# Patient Record
Sex: Female | Born: 1970 | Race: Black or African American | Hispanic: No | Marital: Single | Smoking: Former smoker
Health system: Southern US, Community
[De-identification: ages and names within clinical notes are randomized; demographics above are authoritative.]

## PROBLEM LIST (undated history)

## (undated) DIAGNOSIS — M2550 Pain in unspecified joint: Secondary | ICD-10-CM

## (undated) DIAGNOSIS — T753XXA Motion sickness, initial encounter: Secondary | ICD-10-CM

## (undated) DIAGNOSIS — M1990 Unspecified osteoarthritis, unspecified site: Secondary | ICD-10-CM

## (undated) DIAGNOSIS — E119 Type 2 diabetes mellitus without complications: Secondary | ICD-10-CM

## (undated) DIAGNOSIS — R7303 Prediabetes: Secondary | ICD-10-CM

## (undated) DIAGNOSIS — I10 Essential (primary) hypertension: Secondary | ICD-10-CM

## (undated) HISTORY — PX: APPENDECTOMY: SHX54

## (undated) HISTORY — PX: TONSILLECTOMY: SUR1361

## (undated) HISTORY — DX: Prediabetes: R73.03

---

## 2000-04-23 DIAGNOSIS — I10 Essential (primary) hypertension: Secondary | ICD-10-CM | POA: Insufficient documentation

## 2000-04-23 DIAGNOSIS — E1159 Type 2 diabetes mellitus with other circulatory complications: Secondary | ICD-10-CM | POA: Insufficient documentation

## 2000-04-23 HISTORY — DX: Essential (primary) hypertension: I10

## 2014-04-23 DIAGNOSIS — R7303 Prediabetes: Secondary | ICD-10-CM

## 2014-04-23 HISTORY — DX: Prediabetes: R73.03

## 2014-04-23 HISTORY — PX: ABDOMINAL HYSTERECTOMY: SHX81

## 2017-01-11 ENCOUNTER — Encounter (HOSPITAL_COMMUNITY): Payer: Self-pay | Admitting: Emergency Medicine

## 2017-01-11 DIAGNOSIS — I10 Essential (primary) hypertension: Secondary | ICD-10-CM | POA: Diagnosis not present

## 2017-01-11 DIAGNOSIS — Y92512 Supermarket, store or market as the place of occurrence of the external cause: Secondary | ICD-10-CM | POA: Diagnosis not present

## 2017-01-11 DIAGNOSIS — X500XXA Overexertion from strenuous movement or load, initial encounter: Secondary | ICD-10-CM | POA: Insufficient documentation

## 2017-01-11 DIAGNOSIS — S39012A Strain of muscle, fascia and tendon of lower back, initial encounter: Secondary | ICD-10-CM | POA: Insufficient documentation

## 2017-01-11 DIAGNOSIS — Y990 Civilian activity done for income or pay: Secondary | ICD-10-CM | POA: Diagnosis not present

## 2017-01-11 DIAGNOSIS — M545 Low back pain: Secondary | ICD-10-CM | POA: Diagnosis present

## 2017-01-11 DIAGNOSIS — Y9389 Activity, other specified: Secondary | ICD-10-CM | POA: Diagnosis not present

## 2017-01-11 NOTE — ED Triage Notes (Signed)
Complains of pain to L side of her neck that radiates down back and L leg into calf that started around 8:30pm after opening a heavy freezer door.  Pt ambulatory to triage without difficulty.

## 2017-01-12 ENCOUNTER — Emergency Department (HOSPITAL_COMMUNITY)
Admission: EM | Admit: 2017-01-12 | Discharge: 2017-01-12 | Disposition: A | Discharge status: Home / Self Care | Payer: Worker's Compensation | Attending: Emergency Medicine | Admitting: Emergency Medicine

## 2017-01-12 DIAGNOSIS — S39012A Strain of muscle, fascia and tendon of lower back, initial encounter: Secondary | ICD-10-CM

## 2017-01-12 HISTORY — DX: Essential (primary) hypertension: I10

## 2017-01-12 MED ORDER — IBUPROFEN 800 MG PO TABS
800.0000 mg | ORAL_TABLET | Freq: Once | ORAL | Status: AC
  Administered 2017-01-12: 800 mg via ORAL
  Filled 2017-01-12: qty 1

## 2017-01-12 MED ORDER — IBUPROFEN 800 MG PO TABS: 800.0000 mg | ORAL_TABLET | Freq: Three times a day (TID) | ORAL | 0 refills | Status: DC | PRN

## 2017-01-12 MED ORDER — HYDROCODONE-ACETAMINOPHEN 5-325 MG PO TABS: 1.0000 | ORAL_TABLET | Freq: Four times a day (QID) | ORAL | 0 refills | Status: DC | PRN

## 2017-01-12 MED ORDER — METHOCARBAMOL 500 MG PO TABS: 500.0000 mg | ORAL_TABLET | Freq: Three times a day (TID) | ORAL | 0 refills | Status: DC | PRN

## 2017-01-12 MED ORDER — ONDANSETRON 4 MG PO TBDP: 4.0000 mg | ORAL_TABLET | Freq: Three times a day (TID) | ORAL | 0 refills | Status: DC | PRN

## 2017-01-12 NOTE — ED Notes (Signed)
No answer in waiting area.

## 2017-01-12 NOTE — Discharge Instructions (Signed)
To find a primary care or specialty doctor please call [phone] or [phone] to access "[facility] Find a Doctor Service." ° °You may also go on the [facility] website at [email or web address] ° °There are also multiple [facility], [facility] and [facility] practices throughout the [place] that are frequently accepting new patients. You may find a clinic that is close to your home and contact them. ° °[facility] -  °[address] °[place] [address] °[phone] ° ° °[facility] -  °[address] °[place] [address] [phone] ° ° °[facility] - °[address]  °[place] [address] °[phone] ° ° °

## 2017-01-12 NOTE — ED Provider Notes (Signed)
TIME SEEN: 5:10 AM  CHIEF COMPLAINT: lumbar strain  HPI: Pt is a 46 y.o. female with history of hypertension who presents to the emergency department with diffuse lower back pain that at times radiates down the left leg. 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 She states earlier yesterday she tried to open a heavy door to the dairy cooler while at work and developed diffuse tight lower back pain that sometimes radiates down her left leg. She states she feels like she "threw her back out". No other injury. No numbness, tingling or focal weakness. No bowel or bladder incontinence. No urinary retention. No fever. Has not tried any medications prior to arrival. Pain worse with movement and better with staying still. No other injury.  ROS: See HPI Constitutional: no fever  Eyes: no drainage  ENT: no runny nose   Cardiovascular:  no chest pain  Resp: no SOB  GI: no vomiting GU: no dysuria Integumentary: no rash  Allergy: no hives  Musculoskeletal: no leg swelling  Neurological: no slurred speech ROS otherwise negative  PAST MEDICAL HISTORY/PAST SURGICAL HISTORY:  Past Medical History:  Diagnosis Date  . Hypertension     MEDICATIONS:  Prior to Admission medications   Not on File    ALLERGIES:  Allergies not on file  SOCIAL HISTORY:  Social History  Substance Use Topics  . Smoking status: Never Smoker  . Smokeless tobacco: Never Used  . Alcohol use No    FAMILY HISTORY: No family history on file.  EXAM: BP 109/77 (BP Location: Right Arm)   Pulse 70   Temp 98.2 F (36.8 C) (Oral)   Resp 17   Ht 6\' 2"  (1.88 m)   Wt 107.5 kg (237 lb)   SpO2 100%   BMI 30.43 kg/m  CONSTITUTIONAL: Alert and oriented and responds appropriately to questions. Well-appearing; well-nourished HEAD: Normocephalic EYES: Conjunctivae clear, pupils appear equal, EOMI ENT: normal nose; moist mucous membranes NECK: Supple, no meningismus, no nuchal rigidity, no LAD  CARD: RRR; S1  and S2 appreciated; no murmurs, no clicks, no rubs, no gallops RESP: Normal chest excursion without splinting or tachypnea; breath sounds clear and equal bilaterally; no wheezes, no rhonchi, no rales, no hypoxia or respiratory distress, speaking full sentences ABD/GI: Normal bowel sounds; non-distended; soft, non-tender, no rebound, no guarding, no peritoneal signs, no hepatosplenomegaly BACK:  The back appears normal and is tender over the bilateral paraspinal muscles without midline spinal tenderness or step-off or deformity, there is no CVA tenderness EXT: Normal ROM in all joints; non-tender to palpation; no edema; normal capillary refill; no cyanosis, no calf tenderness or swelling    SKIN: Normal color for age and race; warm; no rash NEURO: Moves all extremities equally, strength 5/5 in all 4 extremities, sensation to light touch intact diffusely, cranial nerves II through XII intact, normal speech, no saddle anesthesia, normal gait PSYCH: The patient's mood and manner are appropriate. Grooming and personal hygiene are appropriate.  MEDICAL DECISION MAKING: Pt here with lumbosacral strain after opening a heavy door at work earlier yesterday. No midline tenderness. Doubt fracture. No focal neurologic deficits. Doubt cauda equina, spinal stenosis, epidural dural abscess or hematoma, discitis, transverse myelitis. I do not feel she needs emergent imaging of her back. She drove herself to the emergency department and would like to drive home. Will give ibuprofen for pain. We'll discharge with prescriptions for Vicodin and Robaxin as well as ibuprofen for pain control. We will provide her with  a work note. Discussed return precautions. Patient comfortable with this plan. Given outpatient PCP for follow-up.  At this time, I do not feel there is any life-threatening condition present. I have reviewed and discussed all results (EKG, imaging, lab, urine as appropriate) and exam findings with patient/family.  I have reviewed nursing notes and appropriate previous records.  I feel the patient is safe to be discharged home without further emergent workup and can continue workup as an outpatient as needed. Discussed usual and customary return precautions. Patient/family verbalize understanding and are comfortable with this plan.  Outpatient follow-up has been provided if needed. All questions have been answered.      Mykeria Garman, Layla Maw, DO 01/12/17 [phone]

## 2017-12-05 ENCOUNTER — Emergency Department (HOSPITAL_COMMUNITY)
Admission: EM | Admit: 2017-12-05 | Discharge: 2017-12-06 | Disposition: A | Discharge status: Home / Self Care | Payer: Self-pay | Attending: Emergency Medicine | Admitting: Emergency Medicine

## 2017-12-05 ENCOUNTER — Encounter (HOSPITAL_COMMUNITY): Payer: Self-pay

## 2017-12-05 DIAGNOSIS — I10 Essential (primary) hypertension: Secondary | ICD-10-CM | POA: Insufficient documentation

## 2017-12-05 DIAGNOSIS — N764 Abscess of vulva: Secondary | ICD-10-CM | POA: Insufficient documentation

## 2017-12-05 DIAGNOSIS — Z79899 Other long term (current) drug therapy: Secondary | ICD-10-CM | POA: Insufficient documentation

## 2017-12-05 DIAGNOSIS — Z760 Encounter for issue of repeat prescription: Secondary | ICD-10-CM | POA: Insufficient documentation

## 2017-12-05 MED ORDER — LIDOCAINE-EPINEPHRINE (PF) 2 %-1:200000 IJ SOLN
20.0000 mL | Freq: Once | INTRAMUSCULAR | Status: AC
  Administered 2017-12-06: 20 mL
  Filled 2017-12-05: qty 20

## 2017-12-05 NOTE — ED Triage Notes (Signed)
Pt complains of high blood pressure and feeling dizzy, she's been out of her meds for a month Pt also complains of a draining vaginal blister that she says she got from a public toilet, she states that it itches and nothing OTC is working

## 2017-12-06 MED ORDER — LISINOPRIL-HYDROCHLOROTHIAZIDE 20-12.5 MG PO TABS: 1.0000 | ORAL_TABLET | Freq: Every day | ORAL | 3 refills | Status: DC

## 2017-12-06 NOTE — ED Provider Notes (Signed)
Kaitlin Holloway Provider Note   CSN: [id number] Arrival date & time: 12/05/17  1936     History   Chief Complaint Chief Complaint  Patient presents with  . Hypertension    HPI Kaitlin Holloway is a 47 y.o. female.  HPI Patient is a 47 year old female presents the emergency department complaints of right labial swelling for the past 2 weeks.  Some drainage 2 weeks ago.  None since then.  She reports this is painful.  No other complaints at this time  She is also requesting refill of her lisinopril hydrochlorothiazide.  She does not have a primary care physician.  No chest pain shortness of breath.   Past Medical History:  Diagnosis Date  . Hypertension     There are no active problems to display for this patient.   Past Surgical History:  Procedure Laterality Date  . ABDOMINAL HYSTERECTOMY    . APPENDECTOMY    . TONSILLECTOMY       OB History   None      Home Medications    Prior to Admission medications   Medication Sig Start Date End Date Taking? Authorizing Provider  lisinopril (PRINIVIL,ZESTRIL) 10 MG tablet Take 10 mg by mouth daily.   Yes [provider]  HYDROcodone-acetaminophen (NORCO/VICODIN) 5-325 MG tablet Take 1-2 tablets by mouth every 6 (six) hours as needed. Patient not taking: Reported on 12/06/2017 01/12/17   Ward, Layla Maw, DO  ibuprofen (ADVIL,MOTRIN) 800 MG tablet Take 1 tablet (800 mg total) by mouth every 8 (eight) hours as needed for mild pain. Patient not taking: Reported on 12/06/2017 01/12/17   Ward, Layla Maw, DO  lisinopril-hydrochlorothiazide (ZESTORETIC) 20-12.5 MG tablet Take 1 tablet by mouth daily. 12/06/17   Azalia Bilis, MD  methocarbamol (ROBAXIN) 500 MG tablet Take 1 tablet (500 mg total) by mouth every 8 (eight) hours as needed for muscle spasms. Patient not taking: Reported on 12/06/2017 01/12/17   Ward, Layla Maw, DO  ondansetron (ZOFRAN ODT) 4 MG disintegrating tablet Take 1 tablet (4 mg  total) by mouth every 8 (eight) hours as needed for nausea or vomiting. Patient not taking: Reported on 12/06/2017 01/12/17   Ward, Layla Maw, DO    Family History History reviewed. No pertinent family history.  Social History Social History   Tobacco Use  . Smoking status: Never Smoker  . Smokeless tobacco: Never Used  Substance Use Topics  . Alcohol use: No  . Drug use: No     Allergies   Morphine and related and Penicillins   Review of Systems Review of Systems  All other systems reviewed and are negative.    Physical Exam Updated Vital Signs BP 126/77   Pulse 71   Temp 98.3 F (36.8 C) (Oral)   Resp 16   SpO2 98%   Physical Exam  Constitutional: She is oriented to person, place, and time. She appears well-developed and well-nourished.  HENT:  Head: Normocephalic.  Eyes: EOM are normal.  Neck: Normal range of motion.  Cardiovascular: Normal rate and regular rhythm.  Pulmonary/Chest: Effort normal and breath sounds normal.  Abdominal: Soft. She exhibits no distension.  Genitourinary:  Genitourinary Comments: Chaperone present.  Superior right labial folliculitis with induration.  No drainage.  No significant surrounding erythema.  Possible ingrown hair.  Musculoskeletal: Normal range of motion.  Neurological: She is alert and oriented to person, place, and time.  Psychiatric: She has a normal mood and affect.  Nursing note and vitals reviewed.  ED Treatments / Results  Labs (all labs ordered are listed, but only abnormal results are displayed) Labs Reviewed - No data to display  EKG None  Radiology No results found.  Procedures INCISION AND DRAINAGE Performed by: Azalia Bilis, MD Authorized by: Azalia Bilis, MD     INCISION AND DRAINAGE Performed by: Azalia Bilis Consent: Verbal consent obtained. Risks and benefits: risks, benefits and alternatives were discussed Time out performed prior to procedure Type: abscess Body area: Right  labia Anesthesia: local infiltration Incision was made with a scalpel. Local anesthetic: lidocaine 2 % with epinephrine Anesthetic total: 3 ml Complexity: complex Blunt dissection to break up loculations Drainage: purulent Drainage amount: Small Packing material: None Patient tolerance: Patient tolerated the procedure well with no immediate complications.     Medications Ordered in ED Medications  lidocaine-EPINEPHrine (XYLOCAINE W/EPI) 2 %-1:200000 (PF) injection 20 mL (20 mLs Infiltration Given by Other 12/06/17 0129)     Initial Impression / Assessment and Plan / ED Course  I have reviewed the triage vital signs and the nursing notes.  Pertinent labs & imaging results that were available during my care of the patient were reviewed by me and considered in my medical decision making (see chart for details).     Incision and drainage of right labial abscess and folliculitis.  No significant surrounding erythema.  Warm compresses.  Warm water soaks.  Instructions to return to the ER for new or worsening symptoms.  Referral to the [facility].  Prescription for her low pressure medicine given.  Final Clinical Impressions(s) / ED Diagnoses   Final diagnoses:  Labial abscess  Medication refill    ED Discharge Orders         Ordered    lisinopril-hydrochlorothiazide (ZESTORETIC) 20-12.5 MG tablet  Daily     12/06/17 0214           Azalia Bilis, MD 12/06/17 [phone]

## 2017-12-06 NOTE — ED Notes (Addendum)
I&D materials at bedside. Lidocaine at bedside

## 2018-07-11 ENCOUNTER — Ambulatory Visit: Payer: Self-pay | Admitting: Internal Medicine

## 2018-07-11 ENCOUNTER — Other Ambulatory Visit: Payer: Self-pay

## 2018-07-11 ENCOUNTER — Encounter: Payer: Self-pay | Admitting: Internal Medicine

## 2018-07-11 VITALS — BP 126/80 | HR 78 | Resp 12 | Ht 73.5 in | Wt 226.0 lb

## 2018-07-11 DIAGNOSIS — I10 Essential (primary) hypertension: Secondary | ICD-10-CM

## 2018-07-11 DIAGNOSIS — R7303 Prediabetes: Secondary | ICD-10-CM

## 2018-07-11 MED ORDER — LISINOPRIL 10 MG PO TABS: 10.0000 mg | ORAL_TABLET | Freq: Every day | ORAL | 3 refills | Status: DC

## 2018-07-11 NOTE — Progress Notes (Signed)
    Subjective:    Patient ID: Kaitlin Holloway, female   DOB: 1970-08-31, 48 y.o.   MRN: [id number]   HPI   Here to establish  1.  Hypertension:  Last filled her bp med in ED and was confused as what she should take:  Lisinopril/HCTZ or Lisinopril.  She actually should be taking the Lisinopril 10 mg daily.  Feels the combination medication is too strong.  Gets light headed if takes daily.  Only taking 1/2 tab every other day.  Current Meds  Medication Sig  . lisinopril (PRINIVIL,ZESTRIL) 10 MG tablet Take 10 mg by mouth daily.   Allergies  Allergen Reactions  . Morphine And Related Other (See Comments)    headaches  . Penicillins Other (See Comments)    Has patient had a PCN reaction causing immediate rash, facial/tongue/throat swelling, SOB or lightheadedness with hypotension: Yes Has patient had a PCN reaction causing severe rash involving mucus membranes or skin necrosis: No Has patient had a PCN reaction that required hospitalization: No Has patient had a PCN reaction occurring within the last 10 years: No If all of the above answers are "NO", then may proceed with Cephalosporin use.      Review of Systems    Objective:   BP 126/80 (BP Location: Left Arm, Patient Position: Sitting, Cuff Size: Normal)   Pulse 78   Resp 12   Ht 6' 1.5" (1.867 m)   Wt 226 lb (102.5 kg)   BMI 29.41 kg/m   Physical Exam NAD [place] HEENT:  PERRL, EOMI,  Neck:  Supple, No adenopathy, no thyromegaly Chest:  CTA CV:  RRR with normal S1 and S2, No S3, S4 or murmur.  Carotid, radial and DP pulses normal and equal LE:  No edema  Assessment & Plan   1.  Essential Hypertension:  Switch back to Lisinopril 10 mg daily.   Will see if can get her back in in 4-6 weeks.  With COVID 19, may need to extend followup out. Fasting labs at same time as follow up.  2.  Prediabetes:  As above.  Fasting labs in 4-6 weeks with visit:  CBC, CMP, A1C, FLP.  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 Was given our Administrator, Civil Service.

## 2018-09-08 ENCOUNTER — Ambulatory Visit: Payer: Medicaid Other | Admitting: Internal Medicine

## 2018-09-08 ENCOUNTER — Other Ambulatory Visit: Payer: Self-pay

## 2018-09-08 ENCOUNTER — Encounter: Payer: Self-pay | Admitting: Internal Medicine

## 2018-09-08 VITALS — BP 124/78 | HR 78 | Resp 12 | Ht 73.5 in | Wt 216.0 lb

## 2018-09-08 DIAGNOSIS — I10 Essential (primary) hypertension: Secondary | ICD-10-CM | POA: Diagnosis not present

## 2018-09-08 DIAGNOSIS — R7303 Prediabetes: Secondary | ICD-10-CM | POA: Diagnosis not present

## 2018-09-08 DIAGNOSIS — Z79899 Other long term (current) drug therapy: Secondary | ICD-10-CM | POA: Diagnosis not present

## 2018-09-08 DIAGNOSIS — E010 Iodine-deficiency related diffuse (endemic) goiter: Secondary | ICD-10-CM | POA: Diagnosis not present

## 2018-09-08 DIAGNOSIS — F439 Reaction to severe stress, unspecified: Secondary | ICD-10-CM

## 2018-09-08 LAB — POCT URINALYSIS DIPSTICK
Bilirubin, UA: NEGATIVE
Blood, UA: NEGATIVE
Glucose, UA: POSITIVE — AB
Leukocytes, UA: NEGATIVE
Nitrite, UA: NEGATIVE
Protein, UA: NEGATIVE
Spec Grav, UA: 1.015 (ref 1.010–1.025)
Urobilinogen, UA: 0.2 E.U./dL
pH, UA: 6 (ref 5.0–8.0)
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
## 2018-09-08 NOTE — Patient Instructions (Signed)
[facility]  Directions  Website Address: [address] [place], [place], [place] [address]  Phone: [phone]  [facility] [address] [place], [place]  [address] [phone]  Drink a glass of water before every meal Drink 6-8 glasses of water daily Eat three meals daily Eat a protein and healthy fat with every meal (eggs,fish, chicken, [place] and limit red meats) Eat 5 servings of vegetables daily, mix the colors Eat 2 servings of fruit daily with skin, if skin is edible Use smaller plates Put food/utensils down as you chew and swallow each bite Eat at a table with friends/family at least once daily, no TV Do not eat in front of the TV  Recent studies show that people who consume all of their calories in a 12 hour period lose weight more efficiently.  For example, if you eat your first meal at 7:00 a.m., your last meal of the day should be completed by 7:00 p.m.

## 2018-09-09 DIAGNOSIS — F439 Reaction to severe stress, unspecified: Secondary | ICD-10-CM | POA: Insufficient documentation

## 2018-09-09 DIAGNOSIS — E010 Iodine-deficiency related diffuse (endemic) goiter: Secondary | ICD-10-CM | POA: Insufficient documentation

## 2018-09-09 LAB — CBC WITH DIFFERENTIAL/PLATELET
Basophils Absolute: 0 10*3/uL (ref 0.0–0.2)
Basos: 1 %
EOS (ABSOLUTE): 0.5 10*3/uL — ABNORMAL HIGH (ref 0.0–0.4)
Eos: 7 %
Hematocrit: 46 % (ref 34.0–46.6)
Hemoglobin: 15.5 g/dL (ref 11.1–15.9)
Immature Grans (Abs): 0 10*3/uL (ref 0.0–0.1)
Immature Granulocytes: 0 %
Lymphocytes Absolute: 1.5 10*3/uL (ref 0.7–3.1)
Lymphs: 25 %
MCH: 29.9 pg (ref 26.6–33.0)
MCHC: 33.7 g/dL (ref 31.5–35.7)
MCV: 89 fL (ref 79–97)
Monocytes Absolute: 0.4 10*3/uL (ref 0.1–0.9)
Monocytes: 6 %
Neutrophils Absolute: 3.8 10*3/uL (ref 1.4–7.0)
Neutrophils: 61 %
Platelets: 190 10*3/uL (ref 150–450)
RBC: 5.18 x10E6/uL (ref 3.77–5.28)
RDW: 13.3 % (ref 11.7–15.4)
WBC: 6.2 10*3/uL (ref 3.4–10.8)

## 2018-09-09 LAB — COMPREHENSIVE METABOLIC PANEL
ALT: 15 IU/L (ref 0–32)
AST: 15 IU/L (ref 0–40)
Albumin/Globulin Ratio: 1.6 (ref 1.2–2.2)
Albumin: 4.2 g/dL (ref 3.8–4.8)
Alkaline Phosphatase: 110 IU/L (ref 39–117)
BUN/Creatinine Ratio: 19 (ref 9–23)
BUN: 18 mg/dL (ref 6–24)
Bilirubin Total: 0.9 mg/dL (ref 0.0–1.2)
CO2: 21 mmol/L (ref 20–29)
Calcium: 9.4 mg/dL (ref 8.7–10.2)
Chloride: 99 mmol/L (ref 96–106)
Creatinine, Ser: 0.95 mg/dL (ref 0.57–1.00)
GFR calc Af Amer: 82 mL/min/{1.73_m2} (ref >59)
GFR calc non Af Amer: 71 mL/min/{1.73_m2} (ref >59)
Globulin, Total: 2.7 g/dL (ref 1.5–4.5)
Glucose: 464 mg/dL — ABNORMAL HIGH (ref 65–99)
Potassium: 4.5 mmol/L (ref 3.5–5.2)
Sodium: 135 mmol/L (ref 134–144)
Total Protein: 6.9 g/dL (ref 6.0–8.5)

## 2018-09-09 LAB — LIPID PANEL W/O CHOL/HDL RATIO
Cholesterol, Total: 210 mg/dL — ABNORMAL HIGH (ref 100–199)
HDL: 54 mg/dL (ref >39)
LDL Calculated: 130 mg/dL — ABNORMAL HIGH (ref 0–99)
Triglycerides: 130 mg/dL (ref 0–149)
VLDL Cholesterol Cal: 26 mg/dL (ref 5–40)

## 2018-09-09 LAB — HGB A1C W/O EAG: Hgb A1c MFr Bld: 12.5 % — ABNORMAL HIGH (ref 4.8–5.6)

## 2018-09-10 MED ORDER — METFORMIN HCL 500 MG PO TABS: 500.0000 mg | ORAL_TABLET | Freq: Two times a day (BID) | ORAL | 11 refills | Status: DC

## 2018-09-10 MED ORDER — AGAMATRIX ULTRA-THIN LANCETS MISC: 11 refills | Status: DC

## 2018-09-10 MED ORDER — GLIPIZIDE 5 MG PO TABS: ORAL_TABLET | ORAL | 11 refills | Status: DC

## 2018-09-10 MED ORDER — GLUCOSE BLOOD VI STRP: ORAL_STRIP | 11 refills | Status: DC

## 2018-09-10 MED ORDER — AGAMATRIX PRESTO W/DEVICE KIT: PACK | 0 refills | Status: DC

## 2018-09-10 NOTE — Addendum Note (Signed)
Addended by: Marcene Duos on: 09/10/2018 09:35 AM   Modules accepted: Orders

## 2018-09-16 ENCOUNTER — Ambulatory Visit: Payer: Medicaid Other | Admitting: Internal Medicine

## 2018-09-16 ENCOUNTER — Other Ambulatory Visit: Payer: Self-pay

## 2018-09-16 DIAGNOSIS — E1165 Type 2 diabetes mellitus with hyperglycemia: Secondary | ICD-10-CM

## 2018-09-16 MED ORDER — METFORMIN HCL ER 500 MG PO TB24: 500.0000 mg | ORAL_TABLET | Freq: Every day | ORAL | 11 refills | Status: DC

## 2018-09-16 MED ORDER — GLIMEPIRIDE 4 MG PO TABS: 4.0000 mg | ORAL_TABLET | Freq: Every day | ORAL | 11 refills | Status: DC

## 2018-09-16 NOTE — Addendum Note (Signed)
Addended by: Marcene Duos on: 09/16/2018 12:41 PM   Modules accepted: Orders

## 2018-09-16 NOTE — Progress Notes (Signed)
See notes attached to previous visit--long discussion regarding DM today.

## 2018-10-28 ENCOUNTER — Ambulatory Visit: Payer: Self-pay | Admitting: Internal Medicine

## 2019-01-09 ENCOUNTER — Encounter: Payer: Medicaid Other | Admitting: Internal Medicine

## 2019-03-04 ENCOUNTER — Encounter: Payer: Self-pay | Admitting: Internal Medicine

## 2019-03-04 ENCOUNTER — Other Ambulatory Visit: Payer: Self-pay

## 2019-03-04 ENCOUNTER — Ambulatory Visit: Payer: Medicaid Other | Admitting: Internal Medicine

## 2019-03-04 VITALS — BP 132/80 | HR 76 | Resp 12 | Ht 73.5 in | Wt 212.0 lb

## 2019-03-04 DIAGNOSIS — N898 Other specified noninflammatory disorders of vagina: Secondary | ICD-10-CM

## 2019-03-04 DIAGNOSIS — E1165 Type 2 diabetes mellitus with hyperglycemia: Secondary | ICD-10-CM

## 2019-03-04 LAB — POCT WET PREP WITH KOH
Clue Cells Wet Prep HPF POC: NEGATIVE
RBC Wet Prep HPF POC: NEGATIVE
Trichomonas, UA: NEGATIVE

## 2019-03-04 MED ORDER — METFORMIN HCL ER 500 MG PO TB24: ORAL_TABLET | ORAL | 11 refills | Status: DC

## 2019-03-04 MED ORDER — GLIPIZIDE 5 MG PO TABS: ORAL_TABLET | ORAL | 11 refills | Status: DC

## 2019-03-04 MED ORDER — FLUCONAZOLE 150 MG PO TABS: ORAL_TABLET | ORAL | 0 refills | Status: DC

## 2019-03-04 NOTE — Progress Notes (Signed)
    Subjective:    Patient ID: Kaitlin Holloway, female   DOB: 06-01-1970, 48 y.o.   MRN: [id number]   HPI   Insistent her symptoms stem from her daughter's use of Raid for ants prior to her symptoms starting.  1.  Vaginal discharge and itching:  For maybe 2 months off and on has had discharge that is creamy white at times almost cottage cheese.  Also with a fish smell.  Had this evaluated at [facility] about 3 weeks ago and treated with Fluconazole--sounds like 2 days. Symptoms improved, but recurred about 1 week later.   No intercourse since 1 month before symptoms started.    2.  DM:  See visits from May.  She stopped both Amaryl and Metformin ER as she received an alert from [facility] that her Metformin should be stopped.  Her A1C was above 12% prior to starting meds.   She denies polydipsia or polyuria.  Current Meds  Medication Sig  . lisinopril (PRINIVIL,ZESTRIL) 10 MG tablet Take 1 tablet (10 mg total) by mouth daily.   Allergies  Allergen Reactions  . Morphine And Related Other (See Comments)    headaches  . Penicillins Other (See Comments)    Has patient had a PCN reaction causing immediate rash, facial/tongue/throat swelling, SOB or lightheadedness with hypotension: Yes Has patient had a PCN reaction causing severe rash involving mucus membranes or skin necrosis: No Has patient had a PCN reaction that required hospitalization: No Has patient had a PCN reaction occurring within the last 10 years: No If all of the above answers are "NO", then may proceed with Cephalosporin use.      Review of Systems    Objective:   BP 132/80 (BP Location: Left Arm, Patient Position: Sitting, Cuff Size: Normal)   Pulse 76   Resp 12   Ht 6' 1.5" (1.867 m)   Wt 212 lb (96.2 kg)   BMI 27.59 kg/m   Physical Exam NAD Lungs:  CTA CV:  RRR without murmur or rub.  Radial and DP pulses normal and equal Abd:  S, NT, No HSM or mass, + BS GU:  External genitalia with mild chronic appearing  inflammation of labia.  White somewhat curd like vaginal discharge with mild vaginal mucosal inflammation.  No cervix.  Wet prep + for yeast Assessment & Plan  1.  Yeast vaginitis:  Discussed at length she will likely not clear the infection until her blood sugars are controlled. Fluconazole 150 mg daily for 5 days.  2.  DM:  To restart Metformin ER 500 mg and Glipizide 5 mg twice daily with meals.  Continue to work on diet and physical activity.  3.  On verge of anger much of interview regarding her health.  Would like for her to work with Adelene Amas, LCSW-A, but will wait until her appt on Nov 18 to broach this with her.  Refused influenza

## 2019-03-11 ENCOUNTER — Ambulatory Visit: Payer: Medicaid Other | Admitting: Internal Medicine

## 2019-03-11 ENCOUNTER — Other Ambulatory Visit: Payer: Self-pay

## 2019-03-25 ENCOUNTER — Other Ambulatory Visit: Payer: Self-pay

## 2019-03-25 DIAGNOSIS — Z20822 Contact with and (suspected) exposure to covid-19: Secondary | ICD-10-CM

## 2019-03-27 ENCOUNTER — Telehealth: Payer: Self-pay

## 2019-03-27 NOTE — Telephone Encounter (Signed)
Caller advise that result are not in yet 

## 2019-03-28 ENCOUNTER — Emergency Department (HOSPITAL_COMMUNITY)
Admission: EM | Admit: 2019-03-28 | Discharge: 2019-03-28 | Disposition: A | Discharge status: Home / Self Care | Payer: PRIVATE HEALTH INSURANCE | Attending: Emergency Medicine | Admitting: Emergency Medicine

## 2019-03-28 ENCOUNTER — Encounter (HOSPITAL_COMMUNITY): Payer: Self-pay | Admitting: Emergency Medicine

## 2019-03-28 ENCOUNTER — Other Ambulatory Visit: Payer: Self-pay

## 2019-03-28 DIAGNOSIS — L0291 Cutaneous abscess, unspecified: Secondary | ICD-10-CM | POA: Diagnosis present

## 2019-03-28 DIAGNOSIS — Z87891 Personal history of nicotine dependence: Secondary | ICD-10-CM | POA: Diagnosis not present

## 2019-03-28 DIAGNOSIS — I10 Essential (primary) hypertension: Secondary | ICD-10-CM | POA: Insufficient documentation

## 2019-03-28 DIAGNOSIS — Z79899 Other long term (current) drug therapy: Secondary | ICD-10-CM | POA: Insufficient documentation

## 2019-03-28 DIAGNOSIS — Z7984 Long term (current) use of oral hypoglycemic drugs: Secondary | ICD-10-CM | POA: Insufficient documentation

## 2019-03-28 LAB — NOVEL CORONAVIRUS, NAA: SARS-CoV-2, NAA: NOT DETECTED

## 2019-03-28 MED ORDER — BUPIVACAINE HCL (PF) 0.5 % IJ SOLN
10.0000 mL | Freq: Once | INTRAMUSCULAR | Status: AC
  Administered 2019-03-28: 12:00:00 10 mL
  Filled 2019-03-28: qty 10

## 2019-03-28 MED ORDER — ACETAMINOPHEN 500 MG PO TABS
1000.0000 mg | ORAL_TABLET | Freq: Once | ORAL | Status: AC
  Administered 2019-03-28: 1000 mg via ORAL
  Filled 2019-03-28: qty 2

## 2019-03-28 MED ORDER — SULFAMETHOXAZOLE-TRIMETHOPRIM 800-160 MG PO TABS: 1.0000 | ORAL_TABLET | Freq: Two times a day (BID) | ORAL | 0 refills | Status: AC

## 2019-03-28 NOTE — Discharge Instructions (Addendum)
Please take all of your antibiotics until finished!   You may develop abdominal discomfort or diarrhea from the antibiotic.  You may help offset this with probiotics which you can buy or get in yogurt. Do not eat  or take the probiotics until 2 hours after your antibiotic.   Your antibiotic has been sent to [facility] as requested.  Please use warm soaks daily and monitor for worsening symptoms.

## 2019-03-28 NOTE — ED Triage Notes (Addendum)
Pt reports boil to inner R thigh/groin area x4 days, reports area is draining some blood tinged fluid. Recently got tested for covid 3 days ago for n/v (neg result in chart).
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
## 2019-07-15 ENCOUNTER — Other Ambulatory Visit: Payer: Self-pay | Admitting: Internal Medicine

## 2019-07-15 ENCOUNTER — Telehealth: Payer: Self-pay | Admitting: Internal Medicine

## 2019-07-15 NOTE — Telephone Encounter (Signed)
Refill request sent in via interface

## 2019-07-15 NOTE — Telephone Encounter (Signed)
Patient called requesting Rx on lisinopril (PRINIVIL,ZESTRIL) 10 MG tablet to be called in to [facility] at [facility].  Please advise.

## 2019-08-14 ENCOUNTER — Emergency Department: Payer: PRIVATE HEALTH INSURANCE

## 2019-08-14 ENCOUNTER — Other Ambulatory Visit: Payer: Self-pay

## 2019-08-14 ENCOUNTER — Emergency Department
Admission: EM | Admit: 2019-08-14 | Discharge: 2019-08-14 | Disposition: A | Discharge status: Home / Self Care | Payer: PRIVATE HEALTH INSURANCE | Attending: Student | Admitting: Student

## 2019-08-14 ENCOUNTER — Encounter: Payer: Self-pay | Admitting: Emergency Medicine

## 2019-08-14 DIAGNOSIS — R7303 Prediabetes: Secondary | ICD-10-CM | POA: Insufficient documentation

## 2019-08-14 DIAGNOSIS — Z7984 Long term (current) use of oral hypoglycemic drugs: Secondary | ICD-10-CM | POA: Diagnosis not present

## 2019-08-14 DIAGNOSIS — I10 Essential (primary) hypertension: Secondary | ICD-10-CM | POA: Insufficient documentation

## 2019-08-14 DIAGNOSIS — M25461 Effusion, right knee: Secondary | ICD-10-CM | POA: Insufficient documentation

## 2019-08-14 DIAGNOSIS — Z79899 Other long term (current) drug therapy: Secondary | ICD-10-CM | POA: Insufficient documentation

## 2019-08-14 DIAGNOSIS — M25561 Pain in right knee: Secondary | ICD-10-CM | POA: Diagnosis present

## 2019-08-14 DIAGNOSIS — B379 Candidiasis, unspecified: Secondary | ICD-10-CM | POA: Diagnosis not present

## 2019-08-14 DIAGNOSIS — Z87891 Personal history of nicotine dependence: Secondary | ICD-10-CM | POA: Diagnosis not present

## 2019-08-14 LAB — WET PREP, GENITAL
Clue Cells Wet Prep HPF POC: NONE SEEN
Sperm: NONE SEEN
Trich, Wet Prep: NONE SEEN

## 2019-08-14 LAB — GLUCOSE, CAPILLARY: Glucose-Capillary: 293 mg/dL — ABNORMAL HIGH (ref 70–99)

## 2019-08-14 IMAGING — DX DG KNEE COMPLETE 4+V*R*
4 series · 4 of 4 positions shown · non-contrast
Comparison: None.

CLINICAL DATA: Right knee pain and swelling.

EXAM:
RIGHT KNEE - COMPLETE 4+ VIEW

[knee obl (1 of 2)]
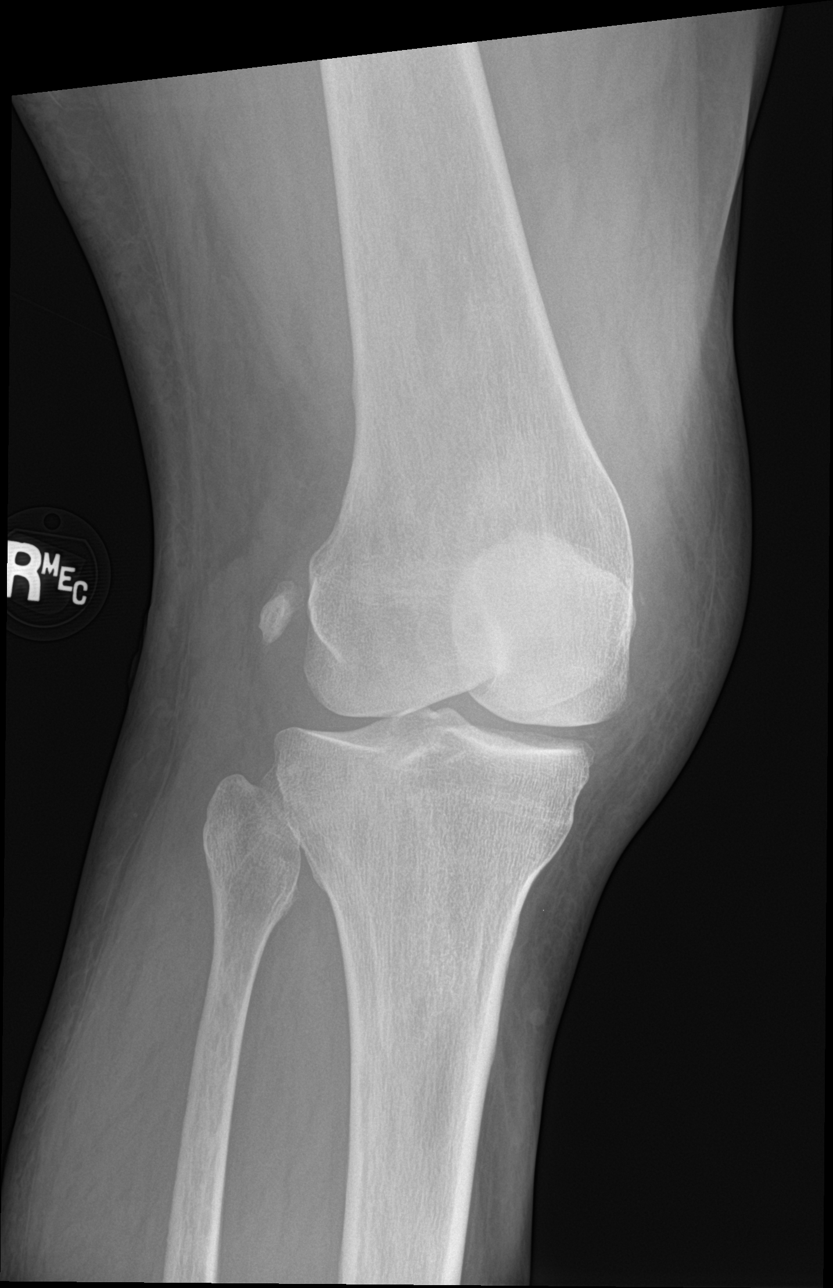

[knee lat]
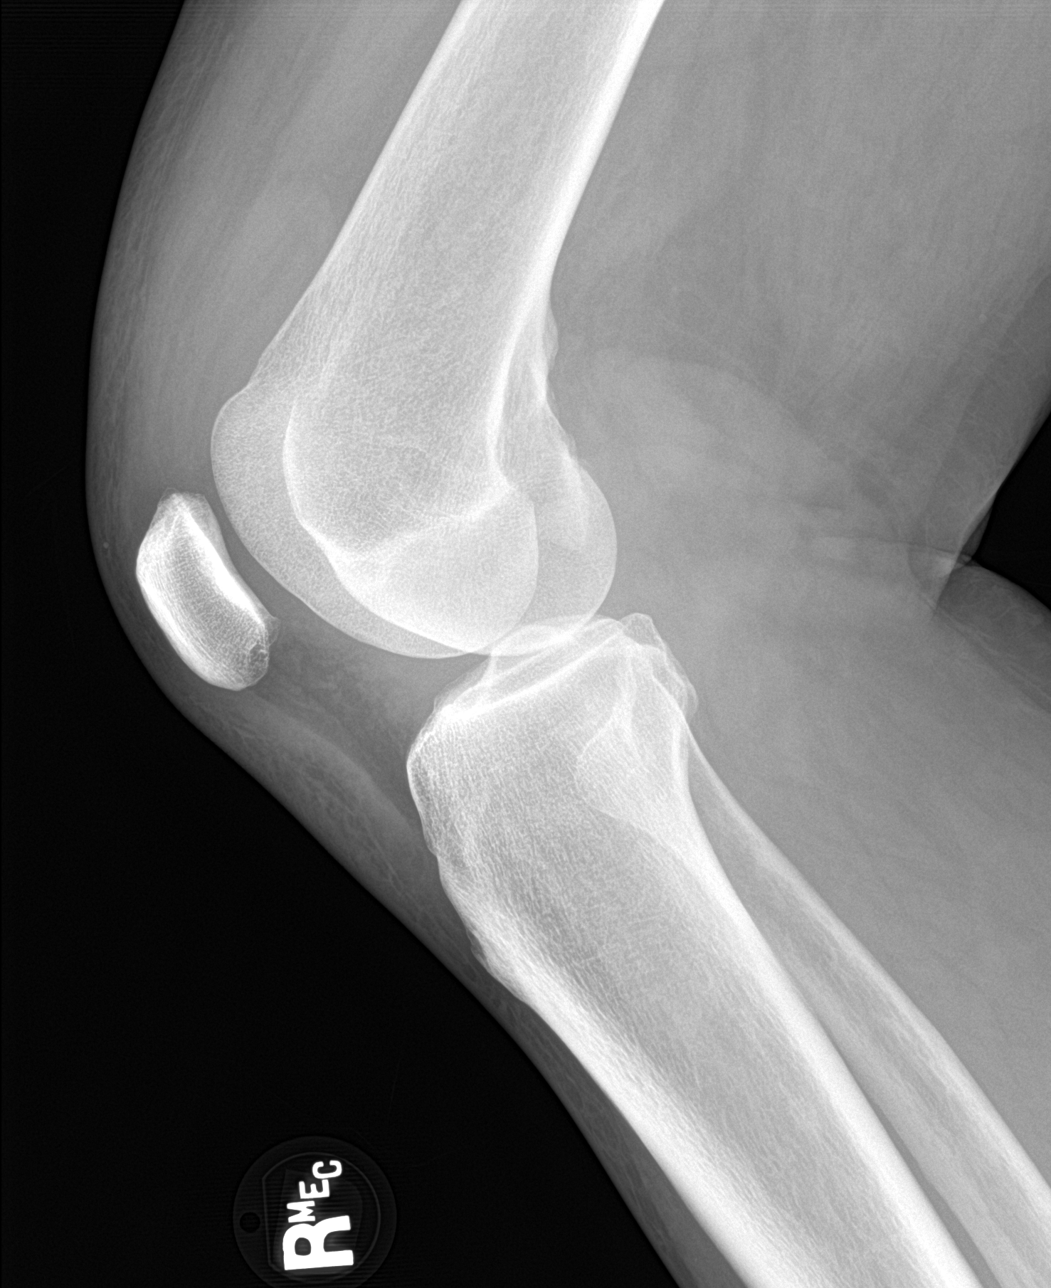

[knee obl (2 of 2)]
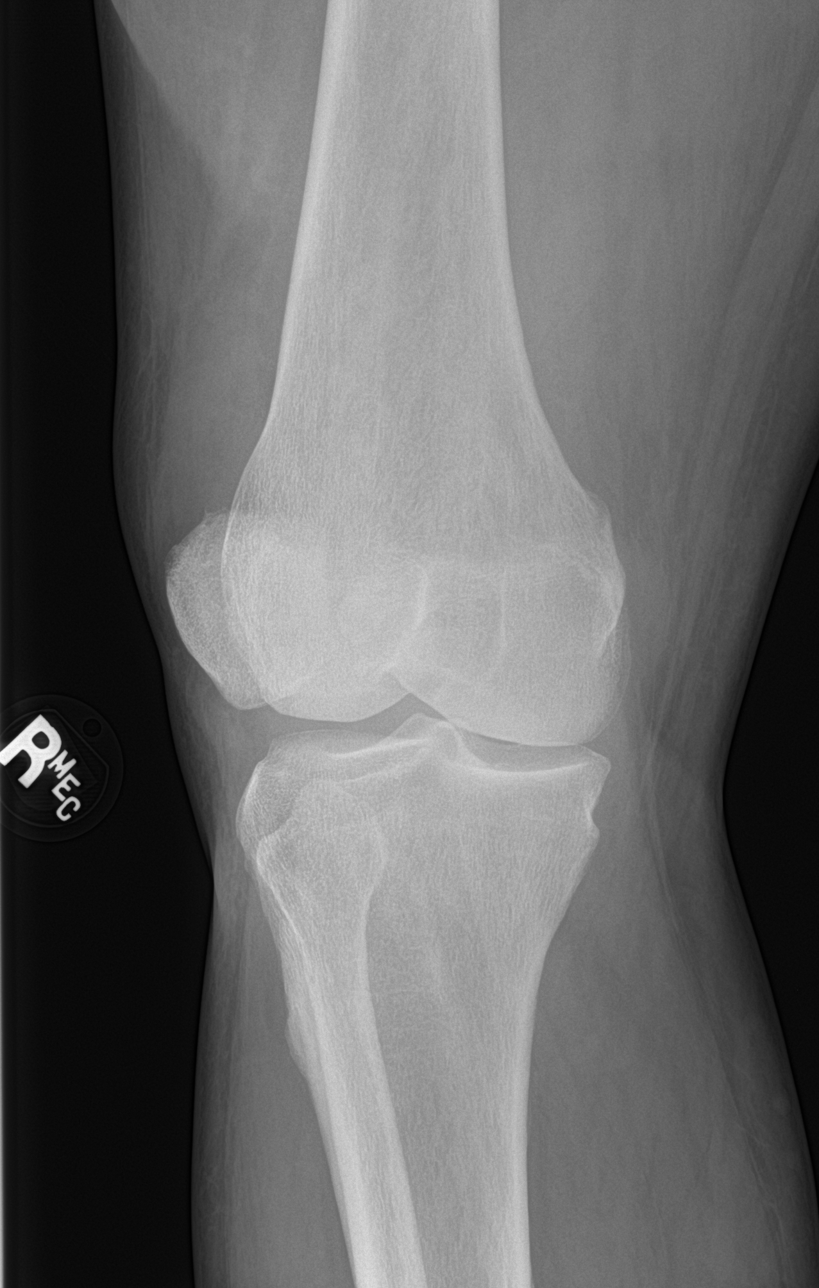

[knee ap]
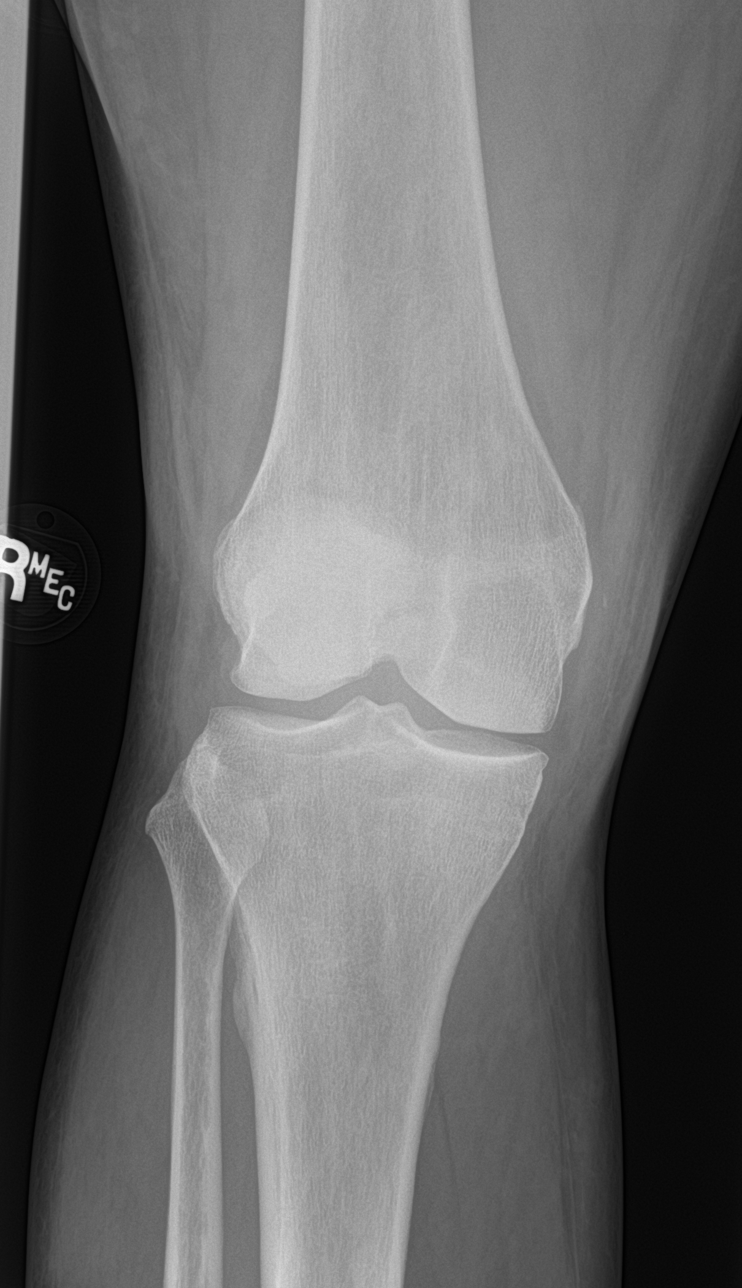

[4 of 4 positions shown; findings below may reference images not displayed]

FINDINGS: No fracture or dislocation. Minimal peripheral spurring of the
medial compartment. There is a joint effusion and generalized soft
tissue edema. No erosion, periosteal reaction or bony destruction.
No evidence of focal bone lesion.
IMPRESSION: 1. Soft tissue edema and joint effusion. No acute osseous
abnormality.
2. Minimal osteoarthritis of the medial compartment.

## 2019-08-14 MED ORDER — TRAMADOL HCL 50 MG PO TABS: 50.0000 mg | ORAL_TABLET | Freq: Four times a day (QID) | ORAL | 0 refills | Status: DC | PRN

## 2019-08-14 MED ORDER — FLUCONAZOLE 150 MG PO TABS
ORAL_TABLET | ORAL | 0 refills | Status: DC
Start: 2019-08-14 — End: 2020-01-25

## 2019-08-14 NOTE — ED Notes (Signed)
E-signature not working at this time. Pt verbalized understanding of D/C instructions, prescriptions and follow up care with no further questions at this time. Pt in NAD and ambulatory at time of D/C.  
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
## 2019-08-14 NOTE — ED Triage Notes (Signed)
FIRST NURSE NOTE- here for right leg pain. Pt also would like to be seen for "chemical induced yeast infection".  NAD. Placed in wheelchair for comfort.

## 2019-08-14 NOTE — ED Triage Notes (Signed)
Pt presents to ED via POV with c/o R leg/knee pain. Pt states initial injury happened in February, also c/o lower back pain due to the way she has been walking due to pain .Denies new injury.

## 2019-08-14 NOTE — ED Notes (Signed)
Pt states that she snagged her knee on something at work and that she's had worsening knee pain that also hurts in her knee and ankle on the right side. Pt also states she has a yeast infection she'd like to be treated for.

## 2019-08-14 NOTE — Discharge Instructions (Signed)
Wear the knee immobilizer anytime you are walking and bearing weight.  Apply ice to the knee.  Take over-the-counter Tylenol and ibuprofen.  Tramadol for pain not controlled by the Tylenol and ibuprofen.  Follow-up with your regular doctor as needed.  Follow-up with orthopedics if your knee is not improving in 5 to 7 days.

## 2019-08-27 ENCOUNTER — Other Ambulatory Visit: Payer: Self-pay

## 2019-08-27 MED ORDER — LISINOPRIL 10 MG PO TABS: 10.0000 mg | ORAL_TABLET | Freq: Every day | ORAL | 1 refills | Status: DC

## 2019-09-11 ENCOUNTER — Ambulatory Visit: Payer: Medicaid Other | Admitting: Internal Medicine

## 2019-09-30 ENCOUNTER — Other Ambulatory Visit: Payer: Self-pay | Admitting: Orthopedic Surgery

## 2019-09-30 DIAGNOSIS — M25461 Effusion, right knee: Secondary | ICD-10-CM

## 2019-10-15 ENCOUNTER — Telehealth: Payer: Self-pay

## 2019-10-15 NOTE — Telephone Encounter (Signed)
Patient called stating she would like to cancel her appointment for next Tuesday 10/20/19. I asked if there was a reason due to rescheduling this patient several times. Patient states she has lost faith in all doctors and everything they do is a experiment and she is sick of it.   Patient also states she no longer wants to be in this world and is a piece few not so nice words. I asked patient if she was actively suicidal patient denied any intent to harm herself and did not have any plan. Patient states "she is simply going to stop all of her medications and if she dies then she just dies". I asked patient if she would be willing to speak with someone about how she is feeling and patient stated no.   Informed patient I would cancel her appointment and let Dr. Delrae Alfred know and she may give her a call. Patient states if she feels like answering the phone she will and not the she won't.    Routing message to Dr. Delrae Alfred and Sherin Quarry for further assistance.

## 2019-10-15 NOTE — Telephone Encounter (Signed)
Called, but patient did not pick up. Left message for her to call back and will try again tomorrow.

## 2019-10-16 NOTE — Telephone Encounter (Signed)
LCSW contacted patient per nurse and provider to check in due to statements reported during phone call with nurse. LCSW left message with purpose of phone call if she wanted to talk further and check in for safety.      Patient spoke with LCSW shared gratitude for concern expressed but did express she did not want to speak further about it. Patient denied any concerns related to safety.

## 2019-10-20 ENCOUNTER — Ambulatory Visit: Payer: Self-pay | Admitting: Internal Medicine

## 2019-10-31 ENCOUNTER — Ambulatory Visit
Admission: RE | Admit: 2019-10-31 | Discharge: 2019-10-31 | Disposition: A | Discharge status: Home / Self Care | Payer: PRIVATE HEALTH INSURANCE | Source: Ambulatory Visit | Attending: Orthopedic Surgery | Admitting: Orthopedic Surgery

## 2019-10-31 ENCOUNTER — Other Ambulatory Visit: Payer: Self-pay

## 2019-10-31 DIAGNOSIS — M25561 Pain in right knee: Secondary | ICD-10-CM
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
## 2020-01-22 ENCOUNTER — Encounter: Payer: Self-pay | Admitting: Ophthalmology

## 2020-01-25 ENCOUNTER — Encounter: Payer: Self-pay | Admitting: Ophthalmology

## 2020-01-25 ENCOUNTER — Other Ambulatory Visit: Payer: Self-pay

## 2020-01-26 ENCOUNTER — Other Ambulatory Visit
Admission: RE | Admit: 2020-01-26 | Discharge: 2020-01-26 | Disposition: A | Discharge status: Home / Self Care | Source: Ambulatory Visit | Attending: Ophthalmology | Admitting: Ophthalmology

## 2020-01-26 DIAGNOSIS — Z01812 Encounter for preprocedural laboratory examination: Secondary | ICD-10-CM | POA: Diagnosis present

## 2020-01-26 DIAGNOSIS — Z20822 Contact with and (suspected) exposure to covid-19: Secondary | ICD-10-CM | POA: Diagnosis not present

## 2020-01-26 LAB — SARS CORONAVIRUS 2 (TAT 6-24 HRS): SARS Coronavirus 2: NEGATIVE

## 2020-01-26 NOTE — Discharge Instructions (Signed)

## 2020-01-28 ENCOUNTER — Ambulatory Visit
Admission: RE | Admit: 2020-01-28 | Discharge: 2020-01-28 | Disposition: A | Discharge status: Home / Self Care | Source: Ambulatory Visit | Attending: Ophthalmology | Admitting: Ophthalmology

## 2020-01-28 ENCOUNTER — Other Ambulatory Visit: Payer: Self-pay

## 2020-01-28 ENCOUNTER — Encounter
Admission: RE | Disposition: A | Discharge status: Home / Self Care | Payer: Self-pay | Source: Ambulatory Visit | Attending: Ophthalmology

## 2020-01-28 ENCOUNTER — Ambulatory Visit: Admitting: Anesthesiology

## 2020-01-28 DIAGNOSIS — E1136 Type 2 diabetes mellitus with diabetic cataract: Secondary | ICD-10-CM | POA: Diagnosis not present

## 2020-01-28 DIAGNOSIS — I10 Essential (primary) hypertension: Secondary | ICD-10-CM | POA: Insufficient documentation

## 2020-01-28 DIAGNOSIS — H2511 Age-related nuclear cataract, right eye: Secondary | ICD-10-CM | POA: Diagnosis present

## 2020-01-28 DIAGNOSIS — Z87891 Personal history of nicotine dependence: Secondary | ICD-10-CM | POA: Insufficient documentation

## 2020-01-28 DIAGNOSIS — R002 Palpitations: Secondary | ICD-10-CM | POA: Insufficient documentation

## 2020-01-28 DIAGNOSIS — Z90710 Acquired absence of both cervix and uterus: Secondary | ICD-10-CM | POA: Diagnosis not present

## 2020-01-28 DIAGNOSIS — Z79899 Other long term (current) drug therapy: Secondary | ICD-10-CM | POA: Insufficient documentation

## 2020-01-28 DIAGNOSIS — M1990 Unspecified osteoarthritis, unspecified site: Secondary | ICD-10-CM | POA: Diagnosis not present

## 2020-01-28 HISTORY — DX: Unspecified osteoarthritis, unspecified site: M19.90

## 2020-01-28 HISTORY — DX: Pain in unspecified joint: M25.50

## 2020-01-28 HISTORY — PX: CATARACT EXTRACTION W/PHACO: SHX586

## 2020-01-28 HISTORY — DX: Type 2 diabetes mellitus without complications: E11.9

## 2020-01-28 HISTORY — DX: Motion sickness, initial encounter: T75.3XXA

## 2020-01-28 SURGERY — PHACOEMULSIFICATION, CATARACT, WITH IOL INSERTION
Anesthesia: Monitor Anesthesia Care | Site: Eye | Laterality: Right

## 2020-01-28 MED ORDER — LIDOCAINE HCL (PF) 2 % IJ SOLN
INTRAOCULAR | Status: DC | PRN
  Administered 2020-01-28: 1 mL via INTRAOCULAR

## 2020-01-28 MED ORDER — EPINEPHRINE PF 1 MG/ML IJ SOLN
INTRAOCULAR | Status: DC | PRN
  Administered 2020-01-28: 91 mL via OPHTHALMIC

## 2020-01-28 MED ORDER — ARMC OPHTHALMIC DILATING DROPS
1.0000 "application " | OPHTHALMIC | Status: DC | PRN
  Administered 2020-01-28 (×3): 1 via OPHTHALMIC

## 2020-01-28 MED ORDER — TETRACAINE 0.5 % OP SOLN OPTIME - NO CHARGE
OPHTHALMIC | Status: DC | PRN
  Administered 2020-01-28: 2 [drp]

## 2020-01-28 MED ORDER — NA CHONDROIT SULF-NA HYALURON 40-17 MG/ML IO SOLN
INTRAOCULAR | Status: DC | PRN
  Administered 2020-01-28: 1 mL via INTRAOCULAR

## 2020-01-28 MED ORDER — BRIMONIDINE TARTRATE-TIMOLOL 0.2-0.5 % OP SOLN
OPHTHALMIC | Status: DC | PRN
  Administered 2020-01-28: 1 [drp] via OPHTHALMIC

## 2020-01-28 MED ORDER — MIDAZOLAM HCL 2 MG/2ML IJ SOLN
INTRAMUSCULAR | Status: DC | PRN
  Administered 2020-01-28: 1.5 mg via INTRAVENOUS
  Administered 2020-01-28: .5 mg via INTRAVENOUS

## 2020-01-28 MED ORDER — TETRACAINE HCL 0.5 % OP SOLN
1.0000 [drp] | OPHTHALMIC | Status: DC | PRN
  Administered 2020-01-28 (×3): 1 [drp] via OPHTHALMIC

## 2020-01-28 MED ORDER — FENTANYL CITRATE (PF) 100 MCG/2ML IJ SOLN
INTRAMUSCULAR | Status: DC | PRN
  Administered 2020-01-28 (×2): 25 ug via INTRAVENOUS
  Administered 2020-01-28: 50 ug via INTRAVENOUS

## 2020-01-28 MED ORDER — PROVISC 10 MG/ML IO SOLN
INTRAOCULAR | Status: DC | PRN
  Administered 2020-01-28: 0.55 mL via INTRAOCULAR

## 2020-01-28 MED ORDER — MOXIFLOXACIN HCL 0.5 % OP SOLN
OPHTHALMIC | Status: DC | PRN
  Administered 2020-01-28: 0.2 mL via OPHTHALMIC

## 2020-01-28 MED ORDER — TRYPAN BLUE 0.06 % OP SOLN
OPHTHALMIC | Status: DC | PRN
  Administered 2020-01-28: 0.5 mL via INTRAOCULAR

## 2020-01-28 SURGICAL SUPPLY — 20 items
DISSECTOR HYDRO NUCLEUS 50X22 (MISCELLANEOUS) ×12 IMPLANT
DRSG TEGADERM 2-3/8X2-3/4 SM (GAUZE/BANDAGES/DRESSINGS) ×3 IMPLANT
GLOVE BIOGEL PI IND STRL 8 (GLOVE) ×1 IMPLANT
GLOVE BIOGEL PI INDICATOR 8 (GLOVE) ×2
GOWN STRL REUS W/ TWL LRG LVL3 (GOWN DISPOSABLE) ×1 IMPLANT
GOWN STRL REUS W/ TWL XL LVL3 (GOWN DISPOSABLE) ×1 IMPLANT
GOWN STRL REUS W/TWL LRG LVL3 (GOWN DISPOSABLE) ×3
GOWN STRL REUS W/TWL XL LVL3 (GOWN DISPOSABLE) ×3
KNIFE 45D UP 2.3 (MISCELLANEOUS) ×3 IMPLANT
LENS IOL DIOP 23.0 (Intraocular Lens) ×3 IMPLANT
LENS IOL TECNIS MONO 23.0 (Intraocular Lens) IMPLANT
MARKER SKIN DUAL TIP RULER LAB (MISCELLANEOUS) ×3 IMPLANT
NDL CAPSULORHEX 25GA (NEEDLE) ×1 IMPLANT
NEEDLE CAPSULORHEX 25GA (NEEDLE) ×3 IMPLANT
PACK CATARACT (MISCELLANEOUS) ×3 IMPLANT
PACK DR. KING ARMS (PACKS) ×3 IMPLANT
PACK EYE AFTER SURG (MISCELLANEOUS) ×3 IMPLANT
SOLUTION OPHTHALMIC SALT (MISCELLANEOUS) ×3 IMPLANT
WATER STERILE IRR 250ML POUR (IV SOLUTION) ×3 IMPLANT
WIPE NON LINTING 3.25X3.25 (MISCELLANEOUS) ×3 IMPLANT

## 2020-01-28 NOTE — Anesthesia Postprocedure Evaluation (Signed)
Anesthesia Post Note  Patient: Kaitlin Holloway  Procedure(s) Performed: CATARACT EXTRACTION PHACO AND INTRAOCULAR LENS PLACEMENT (IOC) RIGHT VISION BLUE 13.73  01:10.8 (Right Eye)     Patient location during evaluation: PACU Anesthesia Type: MAC Level of consciousness: awake and alert Pain management: pain level controlled Vital Signs Assessment: post-procedure vital signs reviewed and stable Respiratory status: spontaneous breathing Cardiovascular status: blood pressure returned to baseline Postop Assessment: no apparent nausea or vomiting, adequate PO intake and no headache Anesthetic complications: no   No complications documented.  Adele Barthel Briawna Carver

## 2020-01-28 NOTE — Op Note (Signed)
  PREOPERATIVE DIAGNOSIS:  Nuclear sclerotic cataract of the RIGHT eye.   POSTOPERATIVE DIAGNOSIS:  Nuclear sclerotic cataract of the RIGHT eye.   OPERATIVE PROCEDURE: Cataract surgery OD   SURGEON:  Elliot Cousin, MD.   ANESTHESIA:  Anesthesiologist: Page, Wille Celeste, MD CRNA: Jimmy Picket, CRNA  1.      Managed anesthesia care. 2.     0.24ml of Shugarcaine was instilled following the paracentesis   COMPLICATIONS:  None.   TECHNIQUE:   Divide and conquer   DESCRIPTION OF PROCEDURE:  The patient was examined and consented in the preoperative holding area where the aforementioned topical anesthesia was applied to the RIGHT eye and then brought back to the Operating Room where the RIGHT eye was prepped and draped in the usual sterile ophthalmic fashion and a lid speculum was placed. A paracentesis was created with the side port blade, the anterior chamber was washed out with trypan blue to stain the anterior capsule, and the anterior chamber was filled with viscoelastic. A near clear corneal incision was performed with the steel keratome. A continuous curvilinear capsulorrhexis was performed with a cystotome followed by the capsulorrhexis forceps. Hydrodissection and hydrodelineation were carried out with BSS on a blunt cannula. The lens was removed in a divide and conquer  technique and the remaining cortical material was removed with the irrigation-aspiration handpiece. The capsular bag was inflated with viscoelastic and the lens was placed in the capsular bag without complication. The remaining viscoelastic was removed from the eye with the irrigation-aspiration handpiece. The wounds were hydrated. The anterior chamber was flushed and the eye was inflated to physiologic pressure. 0.48ml Vigamox was placed in the anterior chamber. The wounds were found to be water tight. The eye was dressed with Vigamox. The patient was given protective glasses to wear throughout the day and a shield with which to  sleep tonight. The patient was also given drops with which to begin a drop regimen today and will follow-up with me in one day. Implant Name Type Inv. Item Serial No. Manufacturer Lot No. LRB No. Used Action  LENS IOL DIOP 23.0 - [id number] Intraocular Lens LENS IOL DIOP 23.0 [id number] [employer]   Right 1 Implanted    Procedure(s) with comments: CATARACT EXTRACTION PHACO AND INTRAOCULAR LENS PLACEMENT (IOC) RIGHT VISION BLUE 13.73  01:10.8 (Right) - Diabetes Latex  Electronically signed: Oaklynn Stierwalt 01/28/2020 8:58 AM

## 2020-01-28 NOTE — H&P (Signed)
   I have reviewed the patient's H&P and agree with its findings. There have been no interval changes.  Lashandra Arauz MD Ophthalmology 

## 2020-01-28 NOTE — Anesthesia Preprocedure Evaluation (Signed)
Anesthesia Evaluation  Patient identified by MRN, date of birth, ID band Patient awake    History of Anesthesia Complications Negative for: history of anesthetic complications  Airway Mallampati: II  TM Distance: >3 FB Neck ROM: Full    Dental no notable dental hx.    Pulmonary former smoker,    Pulmonary exam normal        Cardiovascular hypertension, Pt. on medications Normal cardiovascular exam     Neuro/Psych    GI/Hepatic negative GI ROS, Neg liver ROS,   Endo/Other  diabetes (pre-DM), Type 2  Renal/GU      Musculoskeletal   Abdominal   Peds  Hematology negative hematology ROS (+)   Anesthesia Other Findings   Reproductive/Obstetrics                             Anesthesia Physical Anesthesia Plan  ASA: II  Anesthesia Plan: MAC   Post-op Pain Management:    Induction: Intravenous  PONV Risk Score and Plan: 2 and TIVA, Midazolam and Treatment may vary due to age or medical condition  Airway Management Planned: Nasal Cannula and Natural Airway  Additional Equipment: None  Intra-op Plan:   Post-operative Plan:   Informed Consent: I have reviewed the patients History and Physical, chart, labs and discussed the procedure including the risks, benefits and alternatives for the proposed anesthesia with the patient or authorized representative who has indicated his/her understanding and acceptance.       Plan Discussed with: CRNA  Anesthesia Plan Comments:         Anesthesia Quick Evaluation

## 2020-01-28 NOTE — Transfer of Care (Signed)
Immediate Anesthesia Transfer of Care Note  Patient: Kaitlin Holloway  Procedure(s) Performed: CATARACT EXTRACTION PHACO AND INTRAOCULAR LENS PLACEMENT (IOC) RIGHT VISION BLUE 13.73  01:10.8 (Right Eye)  Patient Location: PACU  Anesthesia Type: MAC  Level of Consciousness: awake, alert  and patient cooperative  Airway and Oxygen Therapy: Patient Spontanous Breathing and Patient connected to supplemental oxygen  Post-op Assessment: Post-op Vital signs reviewed, Patient's Cardiovascular Status Stable, Respiratory Function Stable, Patent Airway and No signs of Nausea or vomiting  Post-op Vital Signs: Reviewed and stable  Complications: No complications documented.

## 2020-01-28 NOTE — Anesthesia Procedure Notes (Signed)
Procedure Name: MAC Performed by: Kaili Castille, CRNA Pre-anesthesia Checklist: Patient identified, Emergency Drugs available, Suction available, Timeout performed and Patient being monitored Patient Re-evaluated:Patient Re-evaluated prior to induction Oxygen Delivery Method: Nasal cannula Placement Confirmation: positive ETCO2       

## 2020-02-01 ENCOUNTER — Encounter: Payer: Self-pay | Admitting: Ophthalmology

## 2020-03-15 ENCOUNTER — Inpatient Hospital Stay (HOSPITAL_COMMUNITY)
Admission: EM | Admit: 2020-03-15 | Discharge: 2020-03-31 | DRG: 501 | Disposition: A | Discharge status: Home / Self Care | Payer: Self-pay | Attending: Internal Medicine | Admitting: Internal Medicine

## 2020-03-15 ENCOUNTER — Emergency Department (HOSPITAL_COMMUNITY): Payer: Self-pay

## 2020-03-15 ENCOUNTER — Other Ambulatory Visit: Payer: Self-pay

## 2020-03-15 DIAGNOSIS — Z20822 Contact with and (suspected) exposure to covid-19: Secondary | ICD-10-CM | POA: Diagnosis present

## 2020-03-15 DIAGNOSIS — Z885 Allergy status to narcotic agent status: Secondary | ICD-10-CM

## 2020-03-15 DIAGNOSIS — R739 Hyperglycemia, unspecified: Secondary | ICD-10-CM | POA: Diagnosis present

## 2020-03-15 DIAGNOSIS — B351 Tinea unguium: Secondary | ICD-10-CM | POA: Diagnosis present

## 2020-03-15 DIAGNOSIS — Z79899 Other long term (current) drug therapy: Secondary | ICD-10-CM

## 2020-03-15 DIAGNOSIS — M6281 Muscle weakness (generalized): Secondary | ICD-10-CM | POA: Diagnosis present

## 2020-03-15 DIAGNOSIS — R45851 Suicidal ideations: Secondary | ICD-10-CM | POA: Diagnosis not present

## 2020-03-15 DIAGNOSIS — M2550 Pain in unspecified joint: Secondary | ICD-10-CM

## 2020-03-15 DIAGNOSIS — Z90721 Acquired absence of ovaries, unilateral: Secondary | ICD-10-CM

## 2020-03-15 DIAGNOSIS — Z91138 Patient's unintentional underdosing of medication regimen for other reason: Secondary | ICD-10-CM

## 2020-03-15 DIAGNOSIS — M79642 Pain in left hand: Secondary | ICD-10-CM

## 2020-03-15 DIAGNOSIS — L03116 Cellulitis of left lower limb: Secondary | ICD-10-CM | POA: Diagnosis present

## 2020-03-15 DIAGNOSIS — E8809 Other disorders of plasma-protein metabolism, not elsewhere classified: Secondary | ICD-10-CM | POA: Diagnosis present

## 2020-03-15 DIAGNOSIS — M6250 Muscle wasting and atrophy, not elsewhere classified, unspecified site: Secondary | ICD-10-CM | POA: Diagnosis present

## 2020-03-15 DIAGNOSIS — M79641 Pain in right hand: Secondary | ICD-10-CM

## 2020-03-15 DIAGNOSIS — R0602 Shortness of breath: Secondary | ICD-10-CM | POA: Diagnosis present

## 2020-03-15 DIAGNOSIS — M6088 Other myositis, other site: Secondary | ICD-10-CM | POA: Diagnosis present

## 2020-03-15 DIAGNOSIS — Z91040 Latex allergy status: Secondary | ICD-10-CM

## 2020-03-15 DIAGNOSIS — T383X6A Underdosing of insulin and oral hypoglycemic [antidiabetic] drugs, initial encounter: Secondary | ICD-10-CM | POA: Diagnosis present

## 2020-03-15 DIAGNOSIS — R9389 Abnormal findings on diagnostic imaging of other specified body structures: Secondary | ICD-10-CM

## 2020-03-15 DIAGNOSIS — I471 Supraventricular tachycardia: Secondary | ICD-10-CM | POA: Diagnosis not present

## 2020-03-15 DIAGNOSIS — E1142 Type 2 diabetes mellitus with diabetic polyneuropathy: Secondary | ICD-10-CM

## 2020-03-15 DIAGNOSIS — Z87891 Personal history of nicotine dependence: Secondary | ICD-10-CM

## 2020-03-15 DIAGNOSIS — Z8616 Personal history of COVID-19: Secondary | ICD-10-CM

## 2020-03-15 DIAGNOSIS — M25571 Pain in right ankle and joints of right foot: Secondary | ICD-10-CM

## 2020-03-15 DIAGNOSIS — R748 Abnormal levels of other serum enzymes: Secondary | ICD-10-CM | POA: Diagnosis present

## 2020-03-15 DIAGNOSIS — R7402 Elevation of levels of lactic acid dehydrogenase (LDH): Secondary | ICD-10-CM | POA: Diagnosis present

## 2020-03-15 DIAGNOSIS — R600 Localized edema: Secondary | ICD-10-CM | POA: Diagnosis present

## 2020-03-15 DIAGNOSIS — D721 Eosinophilia, unspecified: Secondary | ICD-10-CM

## 2020-03-15 DIAGNOSIS — F419 Anxiety disorder, unspecified: Secondary | ICD-10-CM | POA: Diagnosis present

## 2020-03-15 DIAGNOSIS — Z7984 Long term (current) use of oral hypoglycemic drugs: Secondary | ICD-10-CM

## 2020-03-15 DIAGNOSIS — M069 Rheumatoid arthritis, unspecified: Secondary | ICD-10-CM

## 2020-03-15 DIAGNOSIS — Z880 Allergy status to penicillin: Secondary | ICD-10-CM

## 2020-03-15 DIAGNOSIS — Z9049 Acquired absence of other specified parts of digestive tract: Secondary | ICD-10-CM

## 2020-03-15 DIAGNOSIS — Z833 Family history of diabetes mellitus: Secondary | ICD-10-CM

## 2020-03-15 DIAGNOSIS — E1165 Type 2 diabetes mellitus with hyperglycemia: Secondary | ICD-10-CM | POA: Diagnosis present

## 2020-03-15 DIAGNOSIS — G894 Chronic pain syndrome: Secondary | ICD-10-CM | POA: Diagnosis present

## 2020-03-15 DIAGNOSIS — Z83438 Family history of other disorder of lipoprotein metabolism and other lipidemia: Secondary | ICD-10-CM

## 2020-03-15 DIAGNOSIS — M609 Myositis, unspecified: Secondary | ICD-10-CM | POA: Diagnosis present

## 2020-03-15 DIAGNOSIS — M0579 Rheumatoid arthritis with rheumatoid factor of multiple sites without organ or systems involvement: Principal | ICD-10-CM | POA: Diagnosis present

## 2020-03-15 DIAGNOSIS — E11649 Type 2 diabetes mellitus with hypoglycemia without coma: Secondary | ICD-10-CM

## 2020-03-15 DIAGNOSIS — I152 Hypertension secondary to endocrine disorders: Secondary | ICD-10-CM | POA: Diagnosis present

## 2020-03-15 DIAGNOSIS — E119 Type 2 diabetes mellitus without complications: Secondary | ICD-10-CM

## 2020-03-15 DIAGNOSIS — R0600 Dyspnea, unspecified: Secondary | ICD-10-CM | POA: Diagnosis present

## 2020-03-15 DIAGNOSIS — Z8249 Family history of ischemic heart disease and other diseases of the circulatory system: Secondary | ICD-10-CM

## 2020-03-15 DIAGNOSIS — I10 Essential (primary) hypertension: Secondary | ICD-10-CM | POA: Diagnosis present

## 2020-03-15 DIAGNOSIS — Z886 Allergy status to analgesic agent status: Secondary | ICD-10-CM

## 2020-03-15 DIAGNOSIS — Z90710 Acquired absence of both cervix and uterus: Secondary | ICD-10-CM

## 2020-03-15 DIAGNOSIS — M25572 Pain in left ankle and joints of left foot: Secondary | ICD-10-CM

## 2020-03-15 DIAGNOSIS — R161 Splenomegaly, not elsewhere classified: Secondary | ICD-10-CM | POA: Diagnosis present

## 2020-03-15 DIAGNOSIS — R1310 Dysphagia, unspecified: Secondary | ICD-10-CM | POA: Diagnosis present

## 2020-03-15 DIAGNOSIS — E1159 Type 2 diabetes mellitus with other circulatory complications: Secondary | ICD-10-CM | POA: Diagnosis present

## 2020-03-15 DIAGNOSIS — T464X6A Underdosing of angiotensin-converting-enzyme inhibitors, initial encounter: Secondary | ICD-10-CM | POA: Diagnosis present

## 2020-03-15 IMAGING — DX DG CHEST 1V PORT
1 series · 1 of 1 positions shown · non-contrast
Comparison: None.

CLINICAL DATA: Shortness of breath

EXAM:
PORTABLE CHEST 1 VIEW

[chest ap]
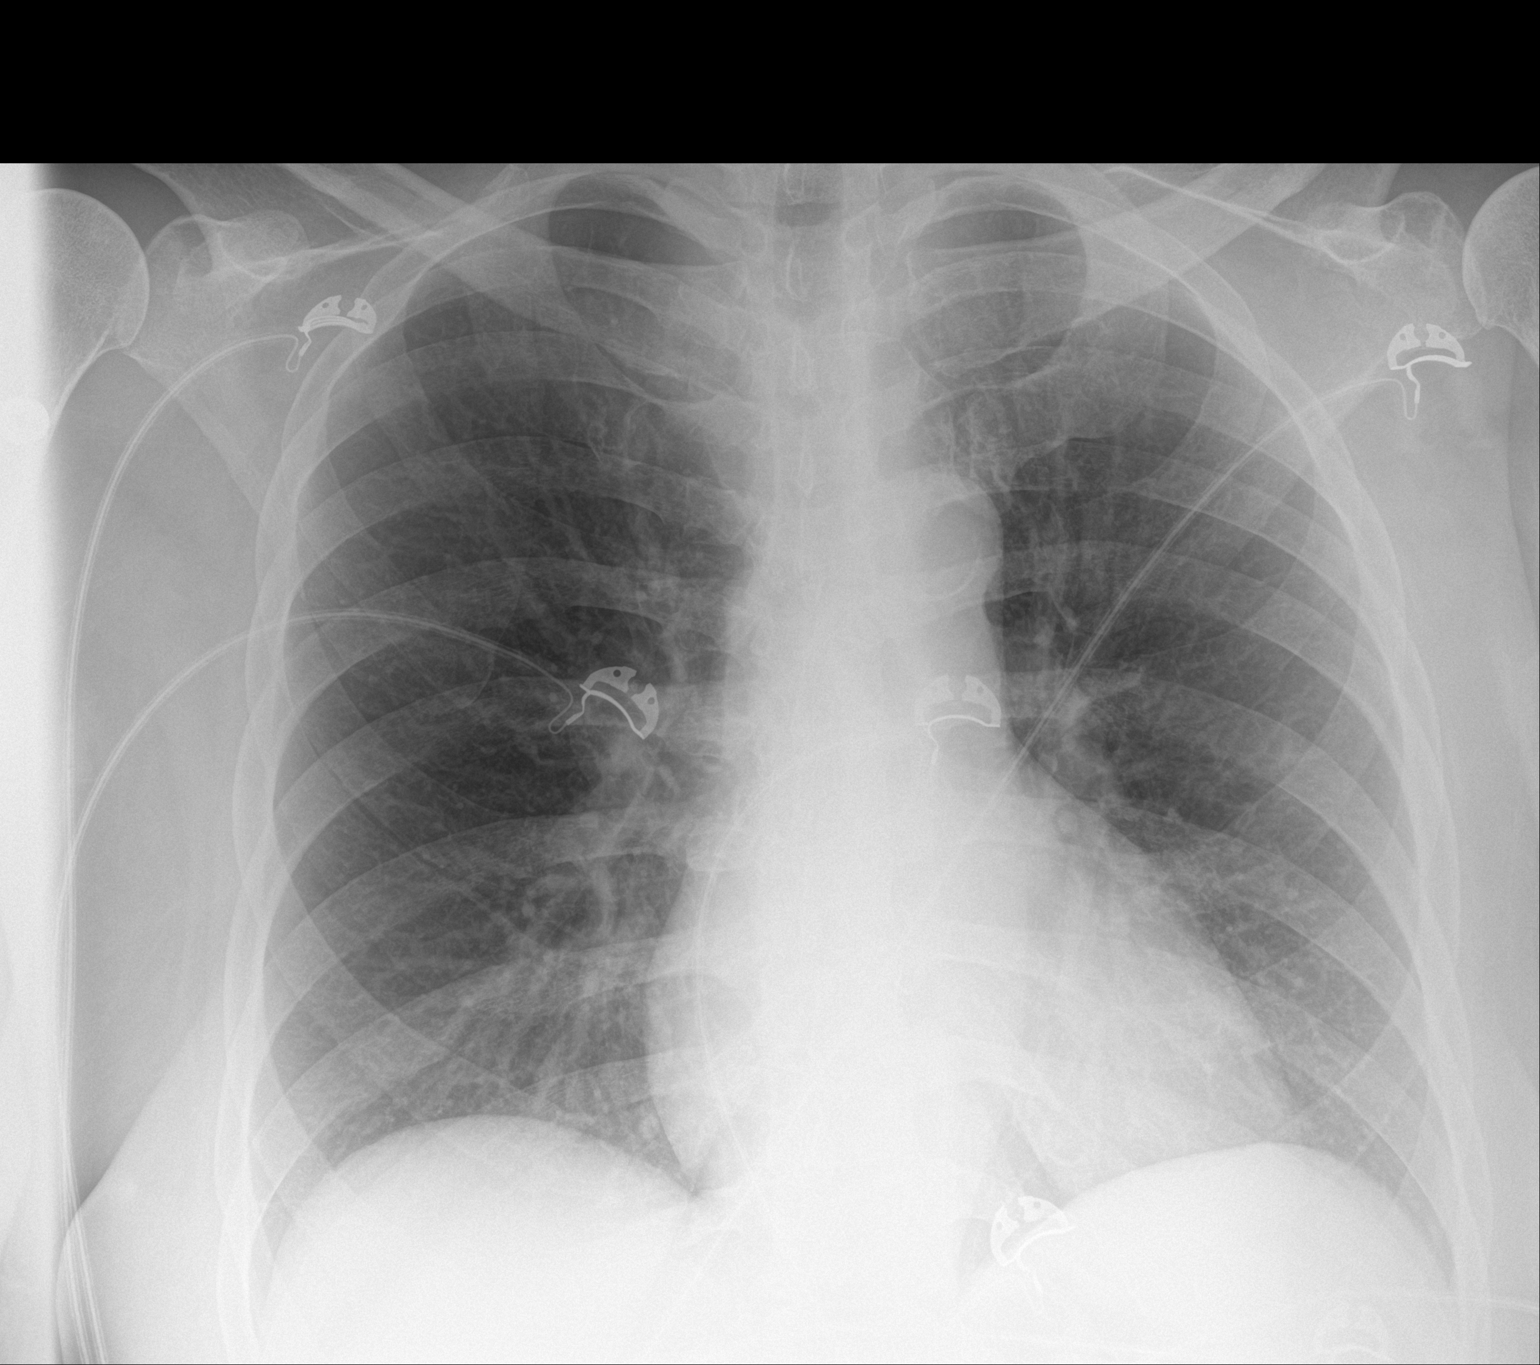

[1 of 1 positions shown; findings below may reference images not displayed]

FINDINGS: The heart size is mildly enlarged. Aortic calcifications are noted.
There is no pneumothorax. No significant pleural effusion. No focal
infiltrate.
IMPRESSION: No active disease.

## 2020-03-15 MED ORDER — KETOROLAC TROMETHAMINE 30 MG/ML IJ SOLN
30.0000 mg | Freq: Once | INTRAMUSCULAR | Status: AC
  Administered 2020-03-16: 30 mg via INTRAVENOUS
  Filled 2020-03-15: qty 1

## 2020-03-15 NOTE — ED Triage Notes (Signed)
Patient is A&O x4 usually ambulates fine but is having severe pain in her joints from her neck down to her feet. Every joint hurts. Currently having difficulty  Breathing do to pain in back.

## 2020-03-16 ENCOUNTER — Observation Stay (HOSPITAL_COMMUNITY): Payer: Self-pay

## 2020-03-16 ENCOUNTER — Encounter (HOSPITAL_COMMUNITY): Payer: Self-pay

## 2020-03-16 ENCOUNTER — Emergency Department (HOSPITAL_COMMUNITY): Payer: Self-pay

## 2020-03-16 ENCOUNTER — Observation Stay (HOSPITAL_BASED_OUTPATIENT_CLINIC_OR_DEPARTMENT_OTHER): Payer: Self-pay

## 2020-03-16 DIAGNOSIS — R0602 Shortness of breath: Secondary | ICD-10-CM

## 2020-03-16 DIAGNOSIS — E1142 Type 2 diabetes mellitus with diabetic polyneuropathy: Secondary | ICD-10-CM

## 2020-03-16 DIAGNOSIS — R609 Edema, unspecified: Secondary | ICD-10-CM

## 2020-03-16 DIAGNOSIS — M2550 Pain in unspecified joint: Secondary | ICD-10-CM

## 2020-03-16 DIAGNOSIS — E119 Type 2 diabetes mellitus without complications: Secondary | ICD-10-CM

## 2020-03-16 DIAGNOSIS — R748 Abnormal levels of other serum enzymes: Secondary | ICD-10-CM | POA: Diagnosis present

## 2020-03-16 DIAGNOSIS — R0600 Dyspnea, unspecified: Secondary | ICD-10-CM | POA: Diagnosis present

## 2020-03-16 DIAGNOSIS — E11649 Type 2 diabetes mellitus with hypoglycemia without coma: Secondary | ICD-10-CM

## 2020-03-16 DIAGNOSIS — R739 Hyperglycemia, unspecified: Secondary | ICD-10-CM | POA: Diagnosis present

## 2020-03-16 LAB — CBC WITH DIFFERENTIAL/PLATELET
Abs Immature Granulocytes: 0.04 10*3/uL (ref 0.00–0.07)
Abs Immature Granulocytes: 0.03 10*3/uL (ref 0.00–0.07)
Basophils Absolute: 0 10*3/uL (ref 0.0–0.1)
Basophils Absolute: 0 10*3/uL (ref 0.0–0.1)
Basophils Relative: 0 %
Basophils Relative: 0 %
Eosinophils Absolute: 12 10*3/uL — ABNORMAL HIGH (ref 0.0–0.5)
Eosinophils Absolute: 12.1 10*3/uL — ABNORMAL HIGH (ref 0.0–0.5)
Eosinophils Relative: 62 %
Eosinophils Relative: 66 %
HCT: 38.9 % (ref 36.0–46.0)
HCT: 38.1 % (ref 36.0–46.0)
Hemoglobin: 13.8 g/dL (ref 12.0–15.0)
Hemoglobin: 13.3 g/dL (ref 12.0–15.0)
Immature Granulocytes: 0 %
Immature Granulocytes: 0 %
Lymphocytes Relative: 10 %
Lymphocytes Relative: 11 %
Lymphs Abs: 1.8 10*3/uL (ref 0.7–4.0)
Lymphs Abs: 1.9 10*3/uL (ref 0.7–4.0)
MCH: 30.1 pg (ref 26.0–34.0)
MCH: 30.4 pg (ref 26.0–34.0)
MCHC: 35.5 g/dL (ref 30.0–36.0)
MCHC: 34.9 g/dL (ref 30.0–36.0)
MCV: 84.9 fL (ref 80.0–100.0)
MCV: 87.2 fL (ref 80.0–100.0)
Monocytes Absolute: 0.4 10*3/uL (ref 0.1–1.0)
Monocytes Absolute: 0.4 10*3/uL (ref 0.1–1.0)
Monocytes Relative: 2 %
Monocytes Relative: 2 %
Neutro Abs: 4.9 10*3/uL (ref 1.7–7.7)
Neutro Abs: 3.7 10*3/uL (ref 1.7–7.7)
Neutrophils Relative %: 26 %
Neutrophils Relative %: 21 %
Platelets: 214 10*3/uL (ref 150–400)
Platelets: 187 10*3/uL (ref 150–400)
RBC: 4.58 MIL/uL (ref 3.87–5.11)
RBC: 4.37 MIL/uL (ref 3.87–5.11)
RDW: 13.2 % (ref 11.5–15.5)
RDW: 13.2 % (ref 11.5–15.5)
WBC: 19.2 10*3/uL — ABNORMAL HIGH (ref 4.0–10.5)
WBC: 18.2 10*3/uL — ABNORMAL HIGH (ref 4.0–10.5)
nRBC: 0 % (ref 0.0–0.2)
nRBC: 0 % (ref 0.0–0.2)

## 2020-03-16 LAB — COMPREHENSIVE METABOLIC PANEL
ALT: 44 U/L (ref 0–44)
ALT: 34 U/L (ref 0–44)
AST: 38 U/L (ref 15–41)
AST: 29 U/L (ref 15–41)
Albumin: 3.1 g/dL — ABNORMAL LOW (ref 3.5–5.0)
Albumin: 2.8 g/dL — ABNORMAL LOW (ref 3.5–5.0)
Alkaline Phosphatase: 107 U/L (ref 38–126)
Alkaline Phosphatase: 82 U/L (ref 38–126)
Anion gap: 11 (ref 5–15)
Anion gap: 10 (ref 5–15)
BUN: 17 mg/dL (ref 6–20)
BUN: 14 mg/dL (ref 6–20)
CO2: 24 mmol/L (ref 22–32)
CO2: 22 mmol/L (ref 22–32)
Calcium: 8.8 mg/dL — ABNORMAL LOW (ref 8.9–10.3)
Calcium: 8.3 mg/dL — ABNORMAL LOW (ref 8.9–10.3)
Chloride: 100 mmol/L (ref 98–111)
Chloride: 102 mmol/L (ref 98–111)
Creatinine, Ser: 0.73 mg/dL (ref 0.44–1.00)
Creatinine, Ser: 0.7 mg/dL (ref 0.44–1.00)
GFR, Estimated: >60 mL/min (ref >60)
GFR, Estimated: >60 mL/min (ref >60)
Glucose, Bld: 285 mg/dL — ABNORMAL HIGH (ref 70–99)
Glucose, Bld: 237 mg/dL — ABNORMAL HIGH (ref 70–99)
Potassium: 4 mmol/L (ref 3.5–5.1)
Potassium: 3.8 mmol/L (ref 3.5–5.1)
Sodium: 135 mmol/L (ref 135–145)
Sodium: 134 mmol/L — ABNORMAL LOW (ref 135–145)
Total Bilirubin: 0.9 mg/dL (ref 0.3–1.2)
Total Bilirubin: 0.7 mg/dL (ref 0.3–1.2)
Total Protein: 6.6 g/dL (ref 6.5–8.1)
Total Protein: 5.6 g/dL — ABNORMAL LOW (ref 6.5–8.1)

## 2020-03-16 LAB — HEMOGLOBIN A1C
Hgb A1c MFr Bld: 9.7 % — ABNORMAL HIGH (ref 4.8–5.6)
Mean Plasma Glucose: 231.69 mg/dL

## 2020-03-16 LAB — SEDIMENTATION RATE: Sed Rate: 28 mm/hr — ABNORMAL HIGH (ref 0–22)

## 2020-03-16 LAB — ECHOCARDIOGRAM COMPLETE
Area-P 1/2: 3.03 cm2
Height: 74 in
P 1/2 time: 765 msec
S' Lateral: 3.3 cm
Weight: 3520.31 oz

## 2020-03-16 LAB — GLUCOSE, CAPILLARY
Glucose-Capillary: 222 mg/dL — ABNORMAL HIGH (ref 70–99)
Glucose-Capillary: 204 mg/dL — ABNORMAL HIGH (ref 70–99)
Glucose-Capillary: 237 mg/dL — ABNORMAL HIGH (ref 70–99)
Glucose-Capillary: 232 mg/dL — ABNORMAL HIGH (ref 70–99)

## 2020-03-16 LAB — PROCALCITONIN: Procalcitonin: 0.1 ng/mL

## 2020-03-16 LAB — BRAIN NATRIURETIC PEPTIDE: B Natriuretic Peptide: 49.4 pg/mL (ref 0.0–100.0)

## 2020-03-16 LAB — RESP PANEL BY RT-PCR (FLU A&B, COVID) ARPGX2
Influenza A by PCR: NEGATIVE
Influenza B by PCR: NEGATIVE
SARS Coronavirus 2 by RT PCR: NEGATIVE

## 2020-03-16 LAB — CK
Total CK: 1219 U/L — ABNORMAL HIGH (ref 38–234)
Total CK: 773 U/L — ABNORMAL HIGH (ref 38–234)

## 2020-03-16 LAB — HIV ANTIBODY (ROUTINE TESTING W REFLEX): HIV Screen 4th Generation wRfx: NONREACTIVE

## 2020-03-16 LAB — C-REACTIVE PROTEIN: CRP: 3.5 mg/dL — ABNORMAL HIGH (ref <1.0)

## 2020-03-16 LAB — TROPONIN I (HIGH SENSITIVITY)
Troponin I (High Sensitivity): 3 ng/L (ref <18)
Troponin I (High Sensitivity): 4 ng/L (ref <18)

## 2020-03-16 LAB — TSH: TSH: 2.694 u[IU]/mL (ref 0.350–4.500)

## 2020-03-16 LAB — PATHOLOGIST SMEAR REVIEW

## 2020-03-16 LAB — D-DIMER, QUANTITATIVE: D-Dimer, Quant: 9.65 ug/mL-FEU — ABNORMAL HIGH (ref 0.00–0.50)

## 2020-03-16 IMAGING — DX DG ANKLE 2V *L*
2 series · 3 of 3 positions shown · non-contrast
Comparison: None.

CLINICAL DATA: Bilateral ankle pain

EXAM:
LEFT ANKLE - 2 VIEW

[Series 1: ankle ap · 0.14mm/px · 2 of 2 slices shown]
[im 1/2]
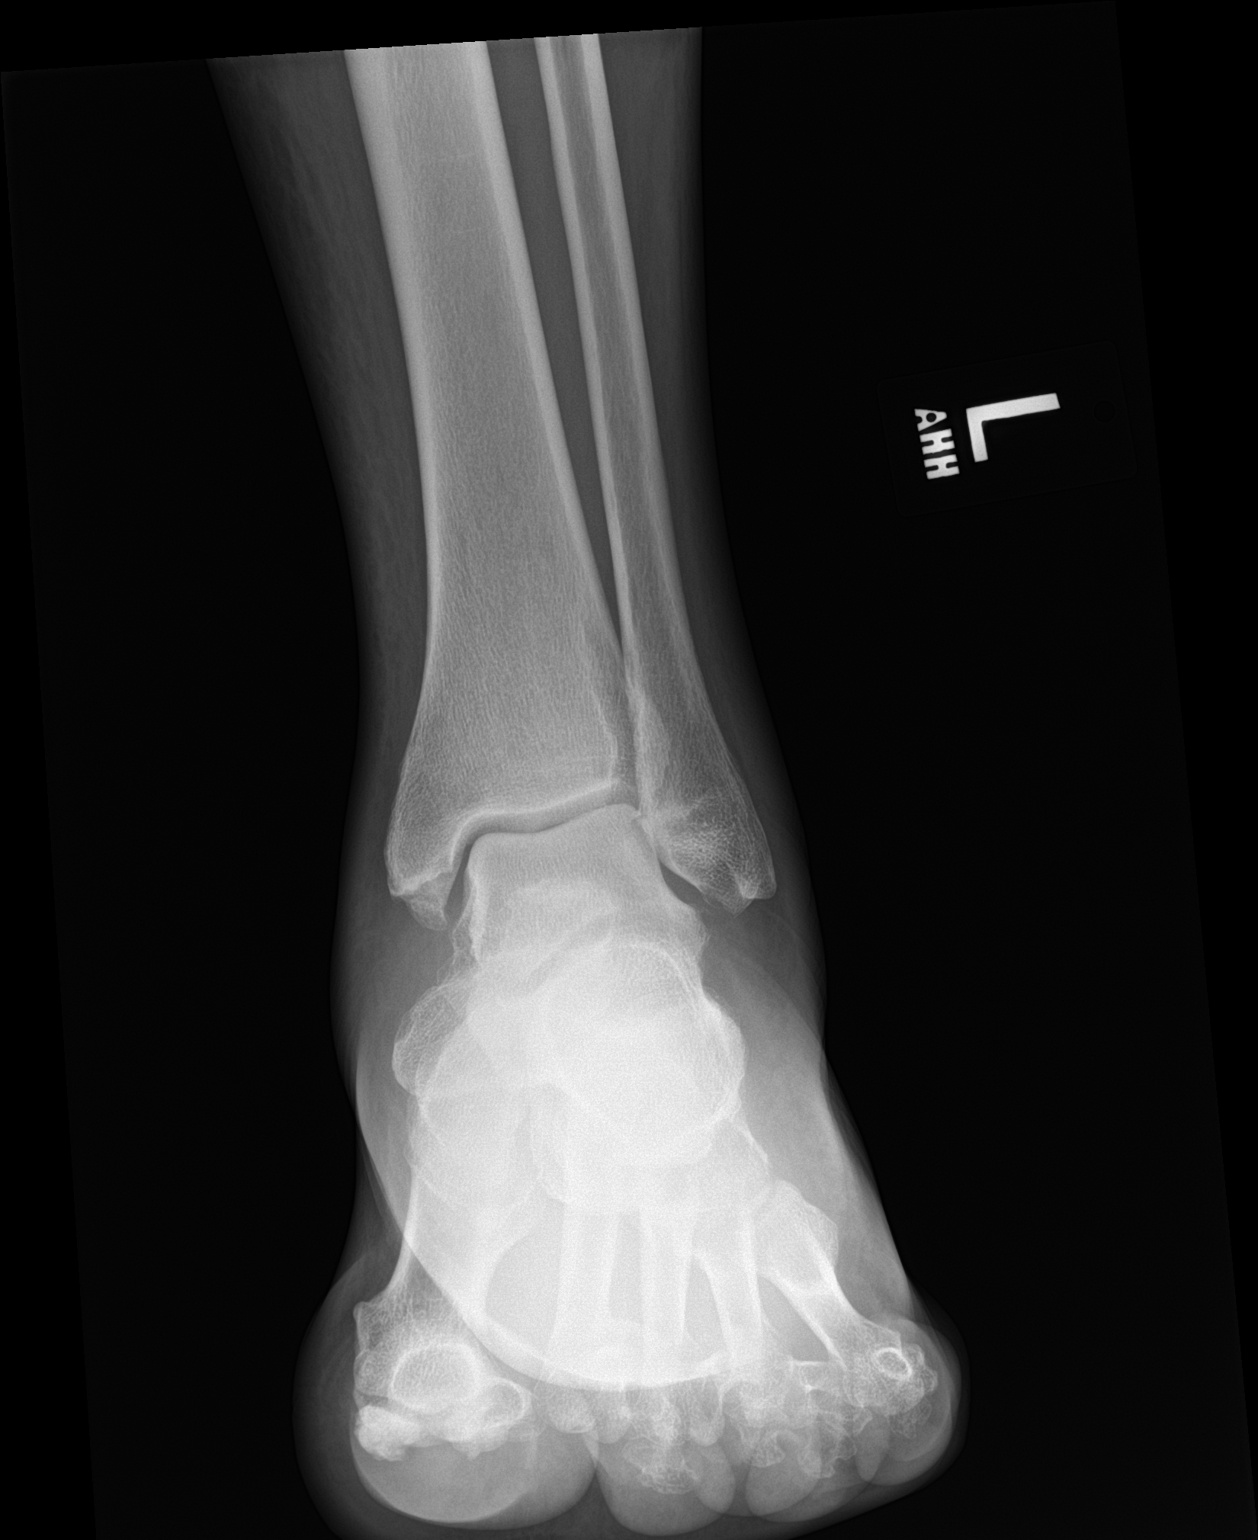
[im 2/2]
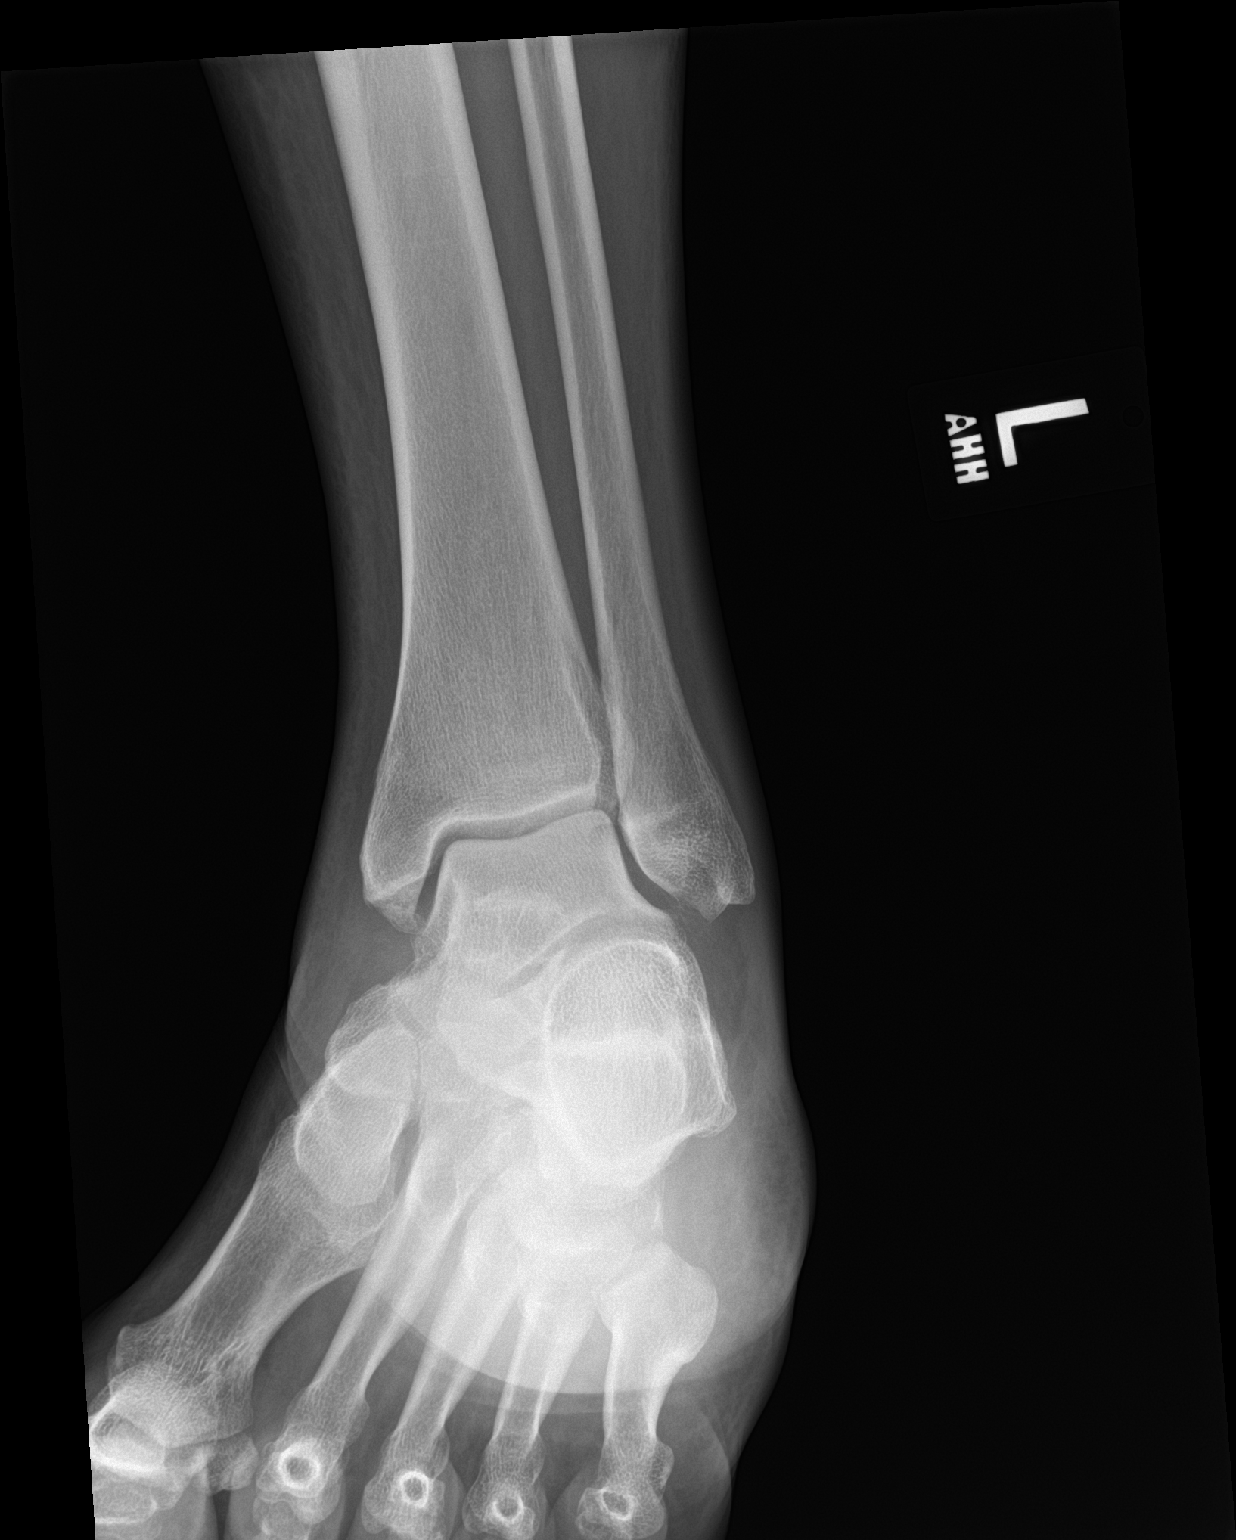

[ankle lat]
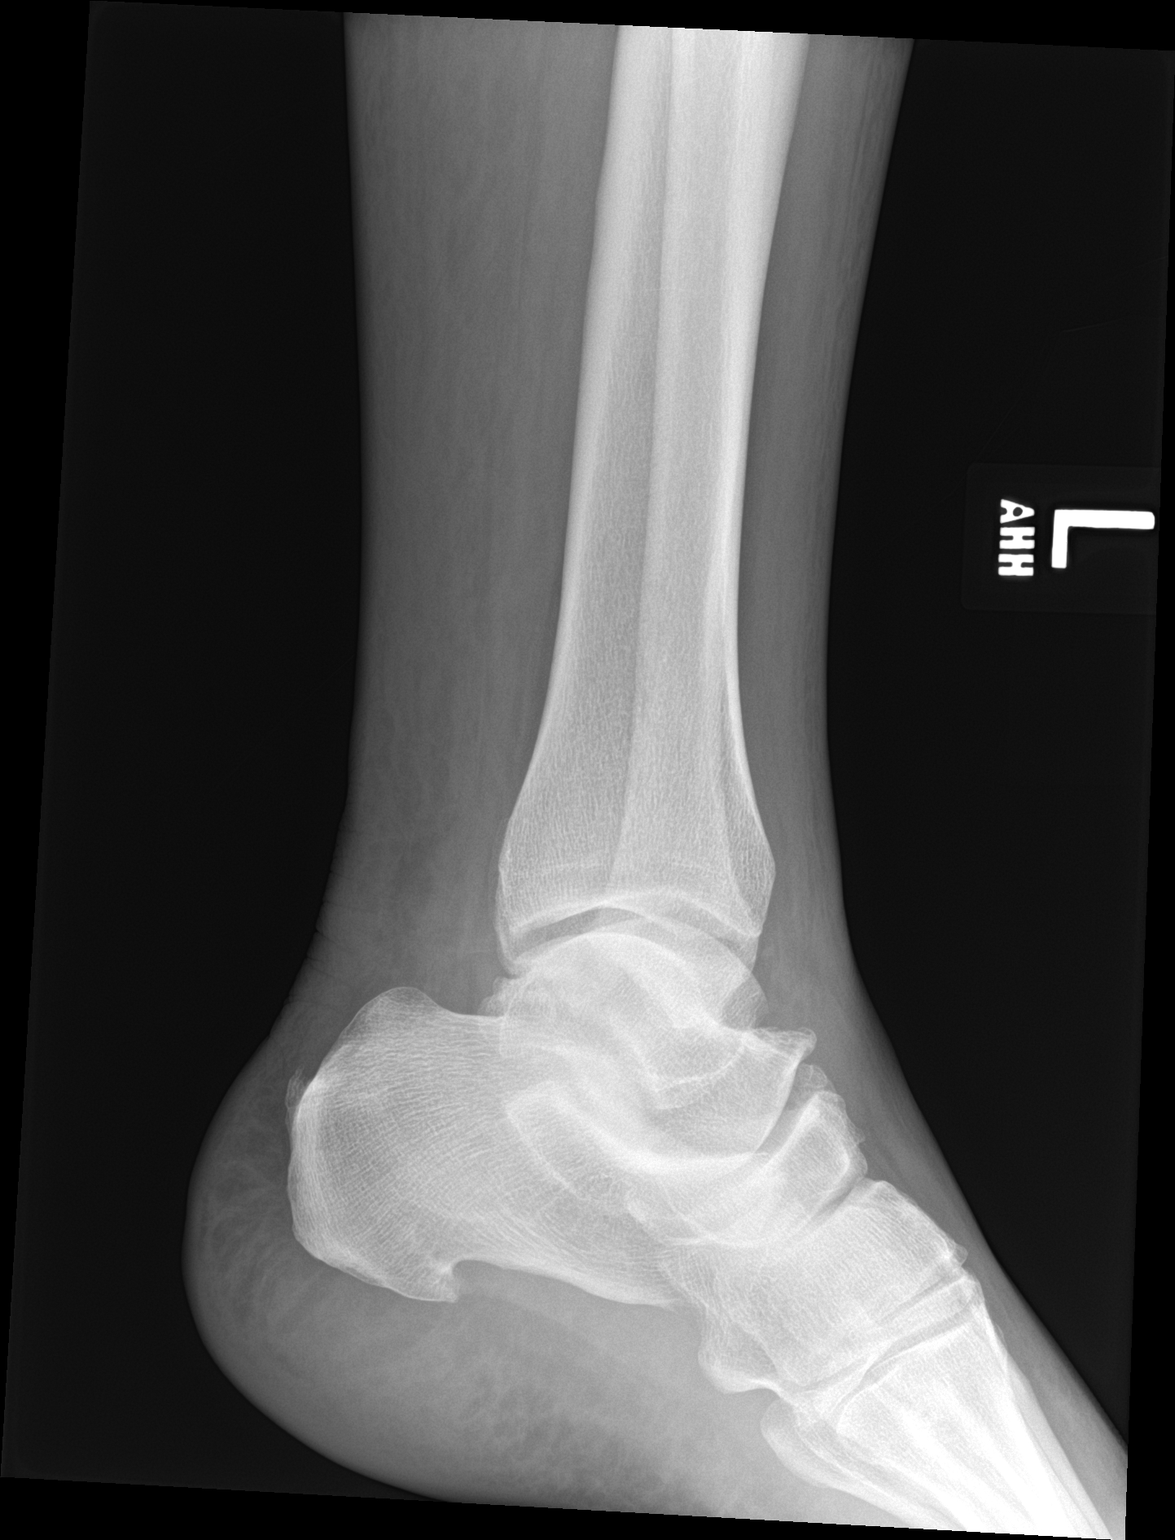

[3 of 3 positions shown; findings below may reference images not displayed]

FINDINGS: There is no evidence of fracture, dislocation, or joint effusion.
Ankle mortise is congruent. Minimal degenerative spurring within the
medial gutter. Mild dorsal hypertrophy at the talonavicular joint.
Small bidirectional calcaneal enthesophytes. No focal soft tissue
swelling.
IMPRESSION: Mild degenerative changes of the left ankle/midfoot.

## 2020-03-16 IMAGING — CT CT ANGIO CHEST
2 of 6 series · 18 of 36 positions shown · IV contrast (omnipaque)
Comparison: None.

CLINICAL DATA: Severe joint pain from neck down to the feet.
Negative COVID test.

EXAM:
CT ANGIOGRAPHY CHEST WITH CONTRAST
TECHNIQUE: Multidetector CT imaging of the chest was performed using the
standard protocol during bolus administration of intravenous
contrast. Multiplanar CT image reconstructions and MIPs were
obtained to evaluate the vascular anatomy.
CONTRAST:  100mL OMNIPAQUE IOHEXOL 350 MG/ML SOLN

[Series 5: thins · axial · 0.66mm/px · z∈[-123,+85]mm · 17 of 236 slices shown]
[im 14/236  lung]
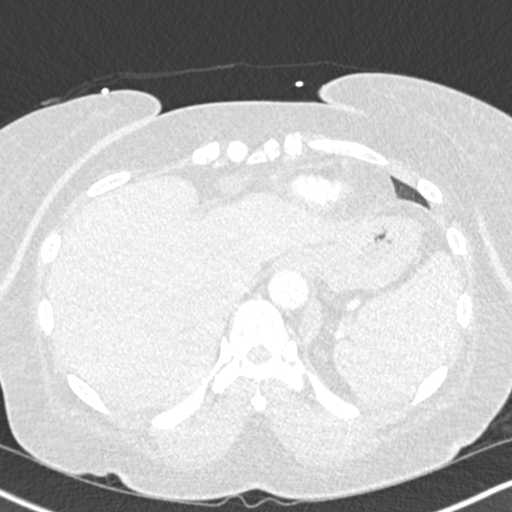
[im 27/236  mediastinal]
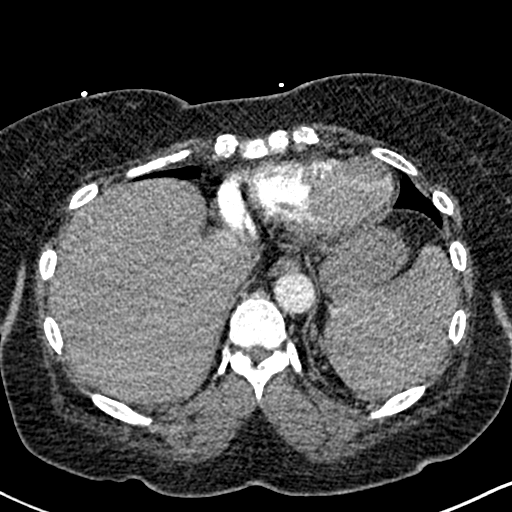
[im 40/236  lung]
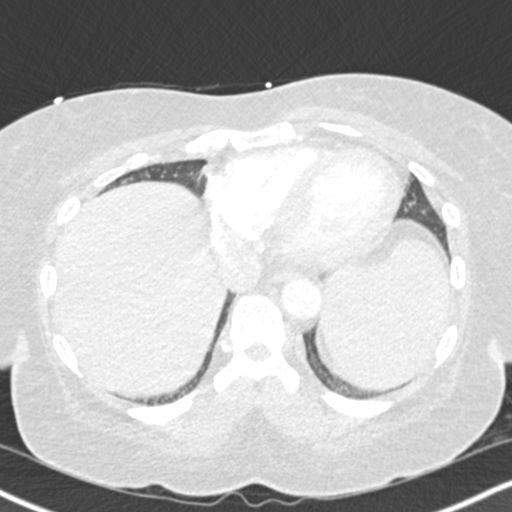
[im 53/236  mediastinal]
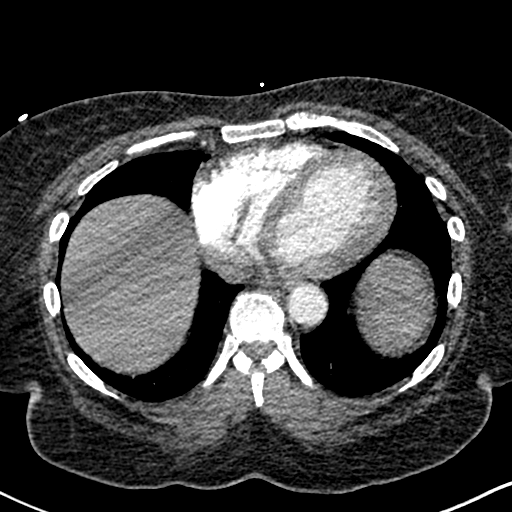
[im 66/236  lung]
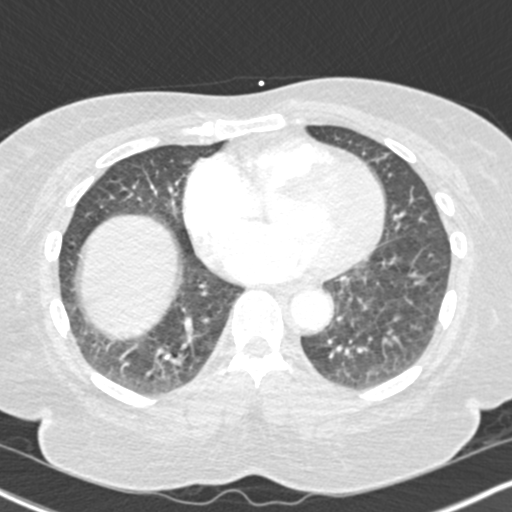
[im 79/236  mediastinal]
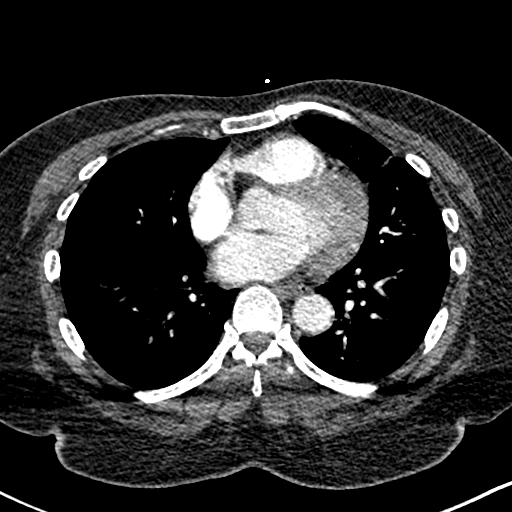
[im 92/236  lung]
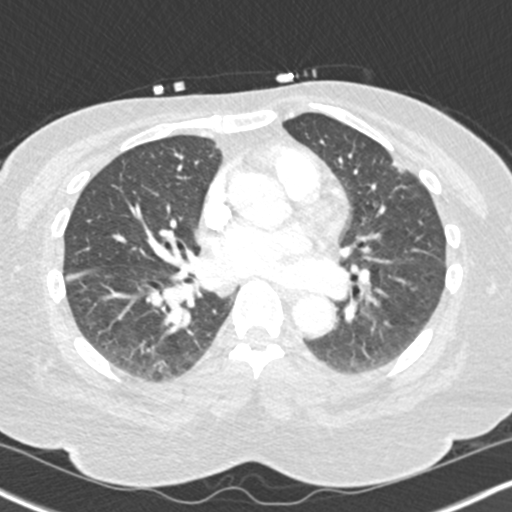
[im 105/236  mediastinal]
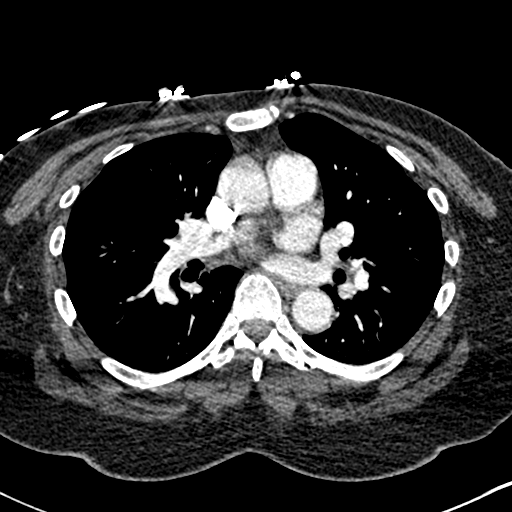
[im 118/236  lung]
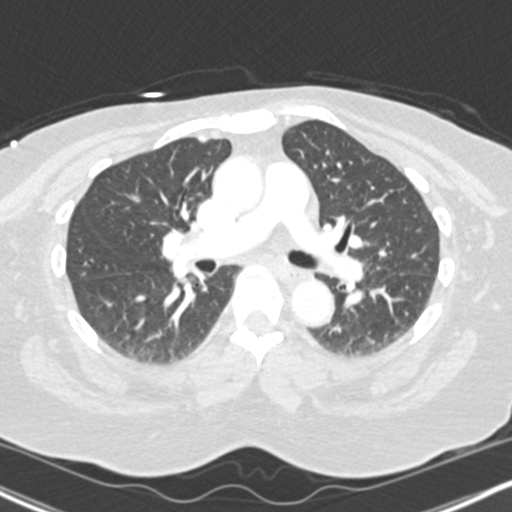
[im 131/236  mediastinal]
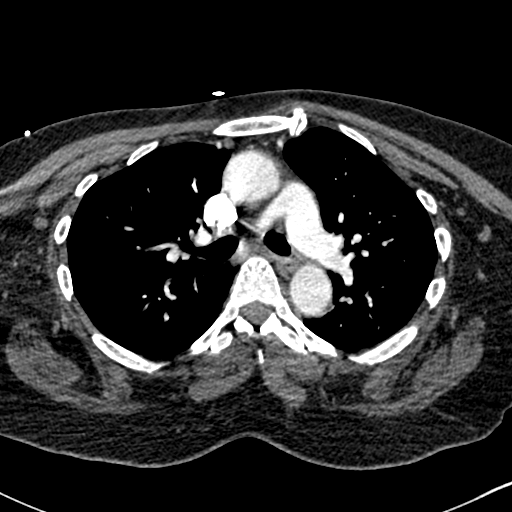
[im 144/236  lung]
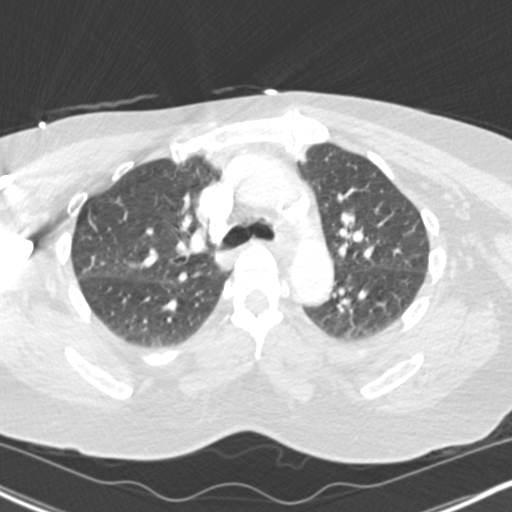
[im 157/236  mediastinal]
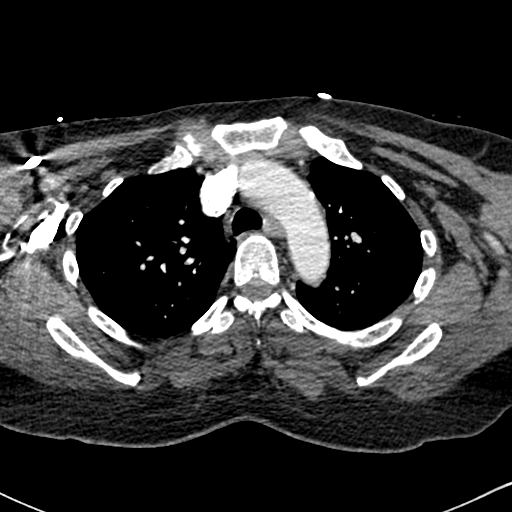
[im 170/236  lung]
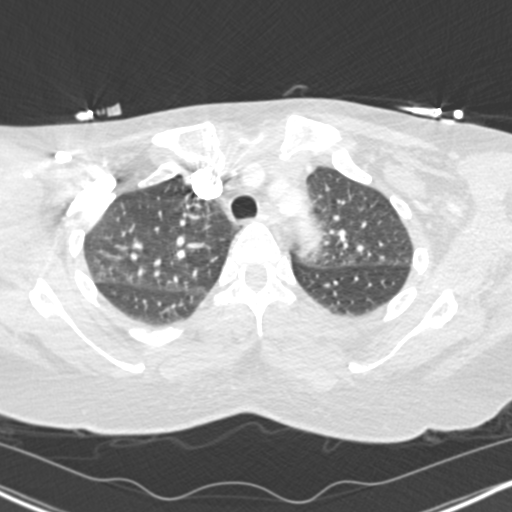
[im 183/236  mediastinal]
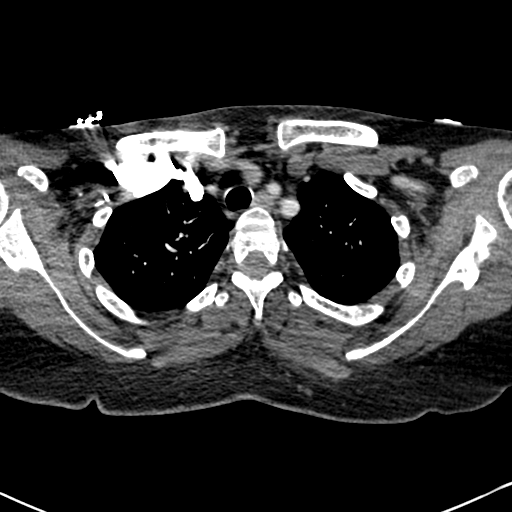
[im 196/236  lung]
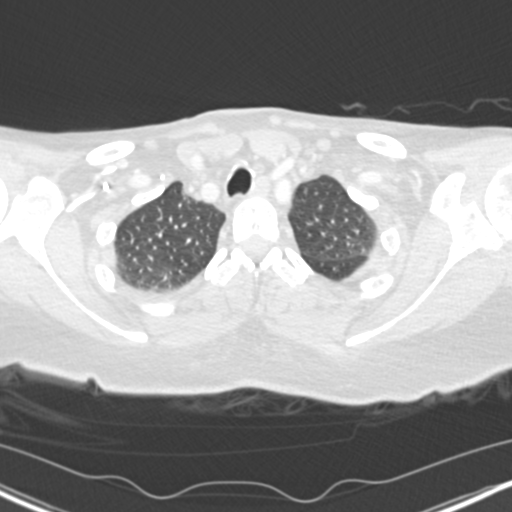
[im 209/236  mediastinal]
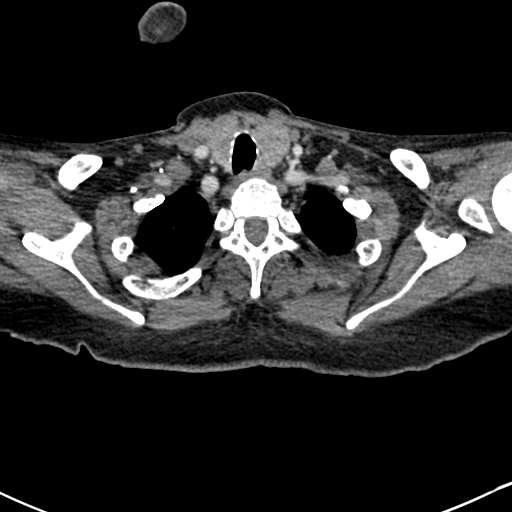
[im 222/236  lung]
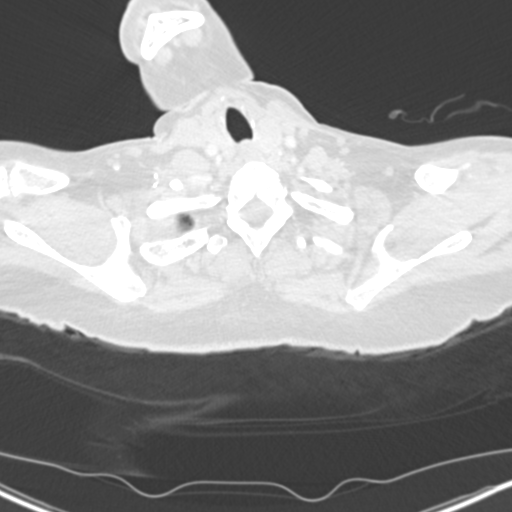

[Series 7: coronal mpr · coronal · 0.48mm/px · 1 of 126 slices shown]
[im 63/126  mediastinal]
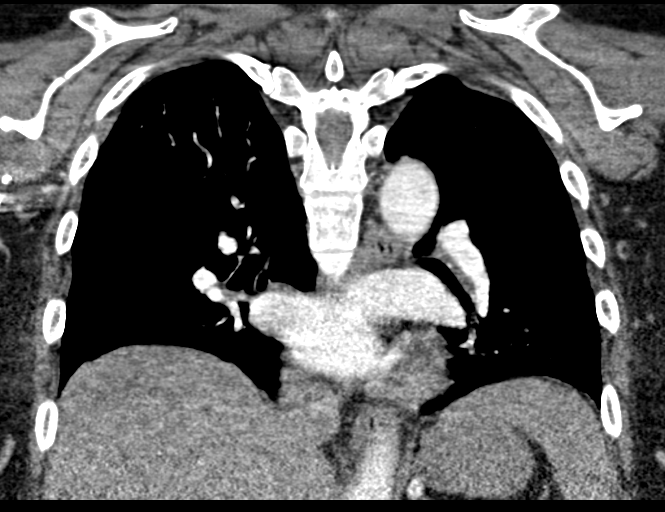

[18 of 36 positions shown; findings below may reference images not displayed]

FINDINGS: Cardiovascular: Examination is limited by respiratory motion
artifact. Within that limitation, there is no central pulmonary
embolus. The size of the main pulmonary artery is normal. Heart size
is normal, with no pericardial effusion. The course and caliber of
the aorta are normal. There is no atherosclerotic calcification.
Opacification decreased due to pulmonary arterial phase contrast
bolus timing.

Mediastinum/Nodes:

-- No mediastinal lymphadenopathy.

-- No hilar lymphadenopathy.

-- No axillary lymphadenopathy.

-- No supraclavicular lymphadenopathy.

--there is a left-sided thyroid nodule measuring 1 cm. No followup
recommended (ref: [HOSPITAL]. [DATE]): 143-50).

-  Unremarkable esophagus.

Lungs/Pleura: Airways are patent. No pleural effusion, lobar
consolidation, pneumothorax or pulmonary infarction.

Upper Abdomen: Contrast bolus timing is not optimized for evaluation
of the abdominal organs. The visualized portions of the organs of
the upper abdomen are normal.

Musculoskeletal: No chest wall abnormality. No bony spinal canal
stenosis.

Review of the MIP images confirms the above findings.
IMPRESSION: 1. Examination is limited by respiratory motion artifact. Within
that limitation, there is no central pulmonary embolus.
2. No acute cardiopulmonary process.

## 2020-03-16 IMAGING — DX DG ANKLE 2V *R*
2 series · 3 of 3 positions shown · non-contrast
Comparison: None.

CLINICAL DATA: Right ankle pain

EXAM:
RIGHT ANKLE - 2 VIEW

[Series 1: ankle ap · 0.14mm/px · 2 of 2 slices shown]
[im 1/2]
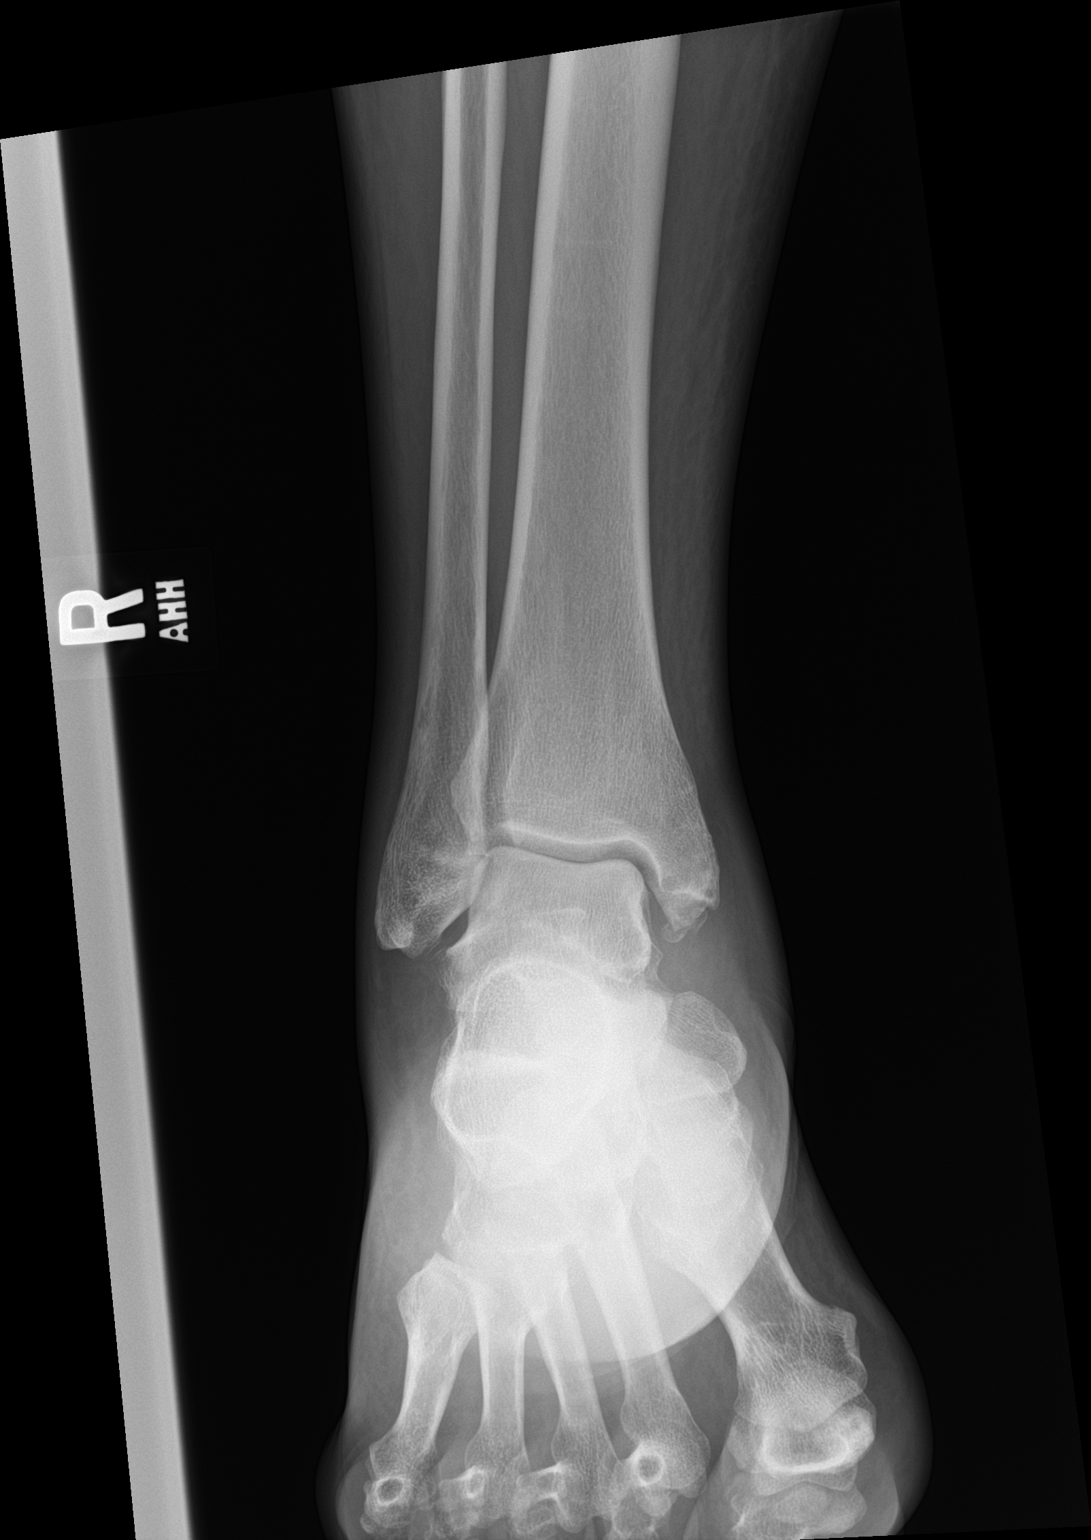
[im 2/2]
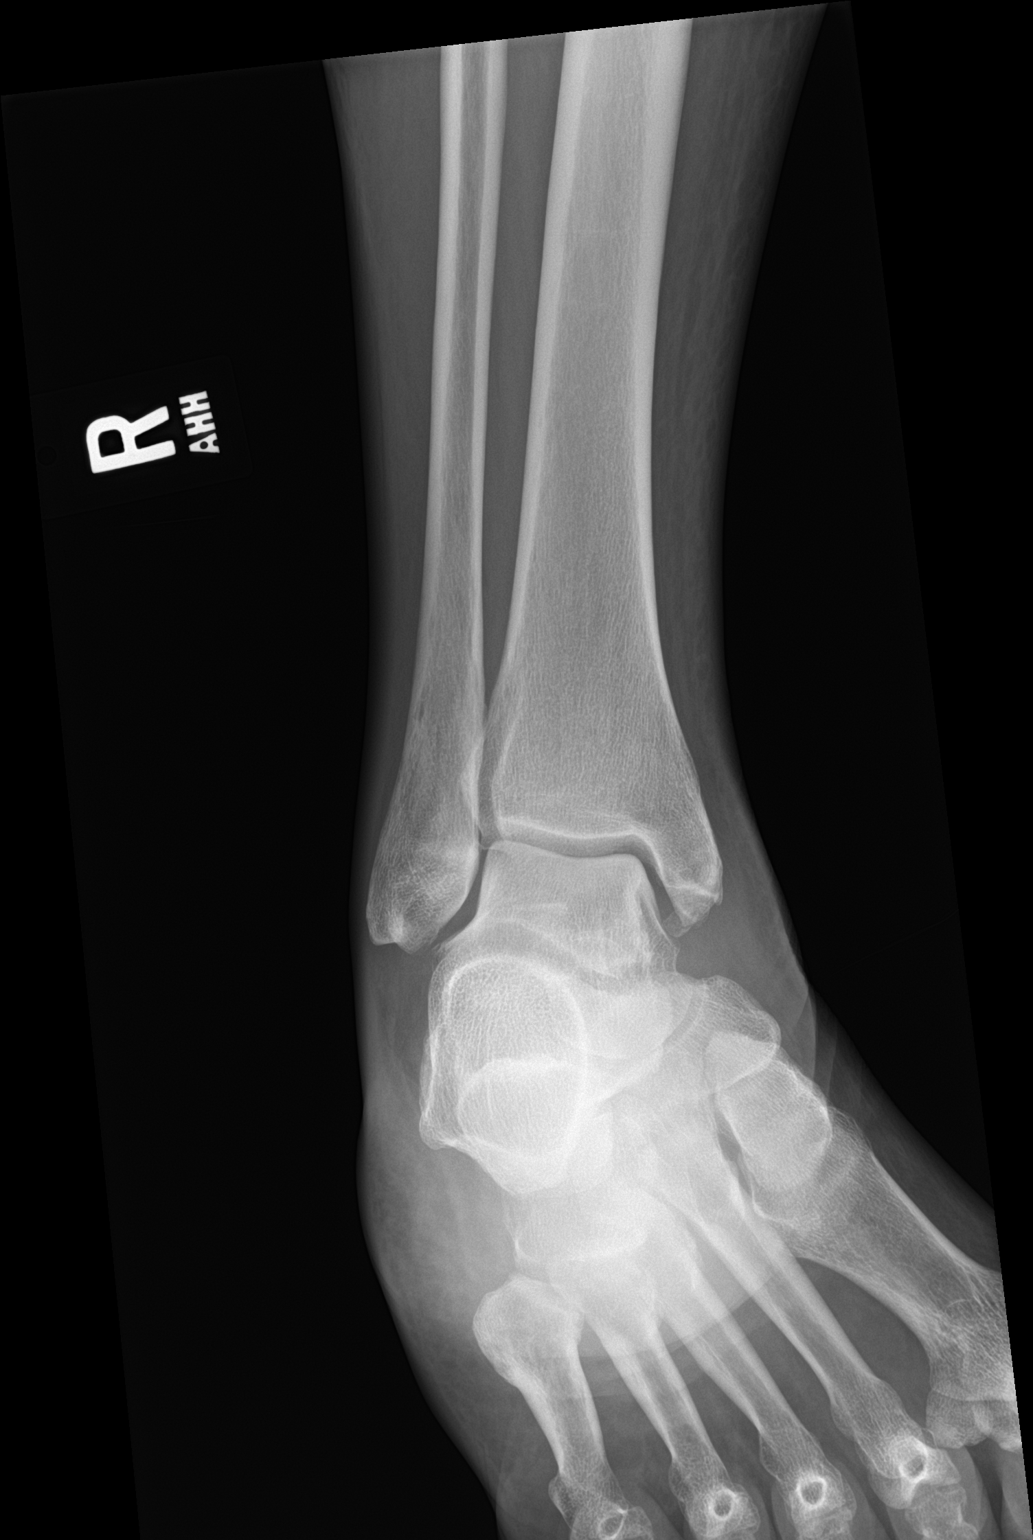

[ankle lat]
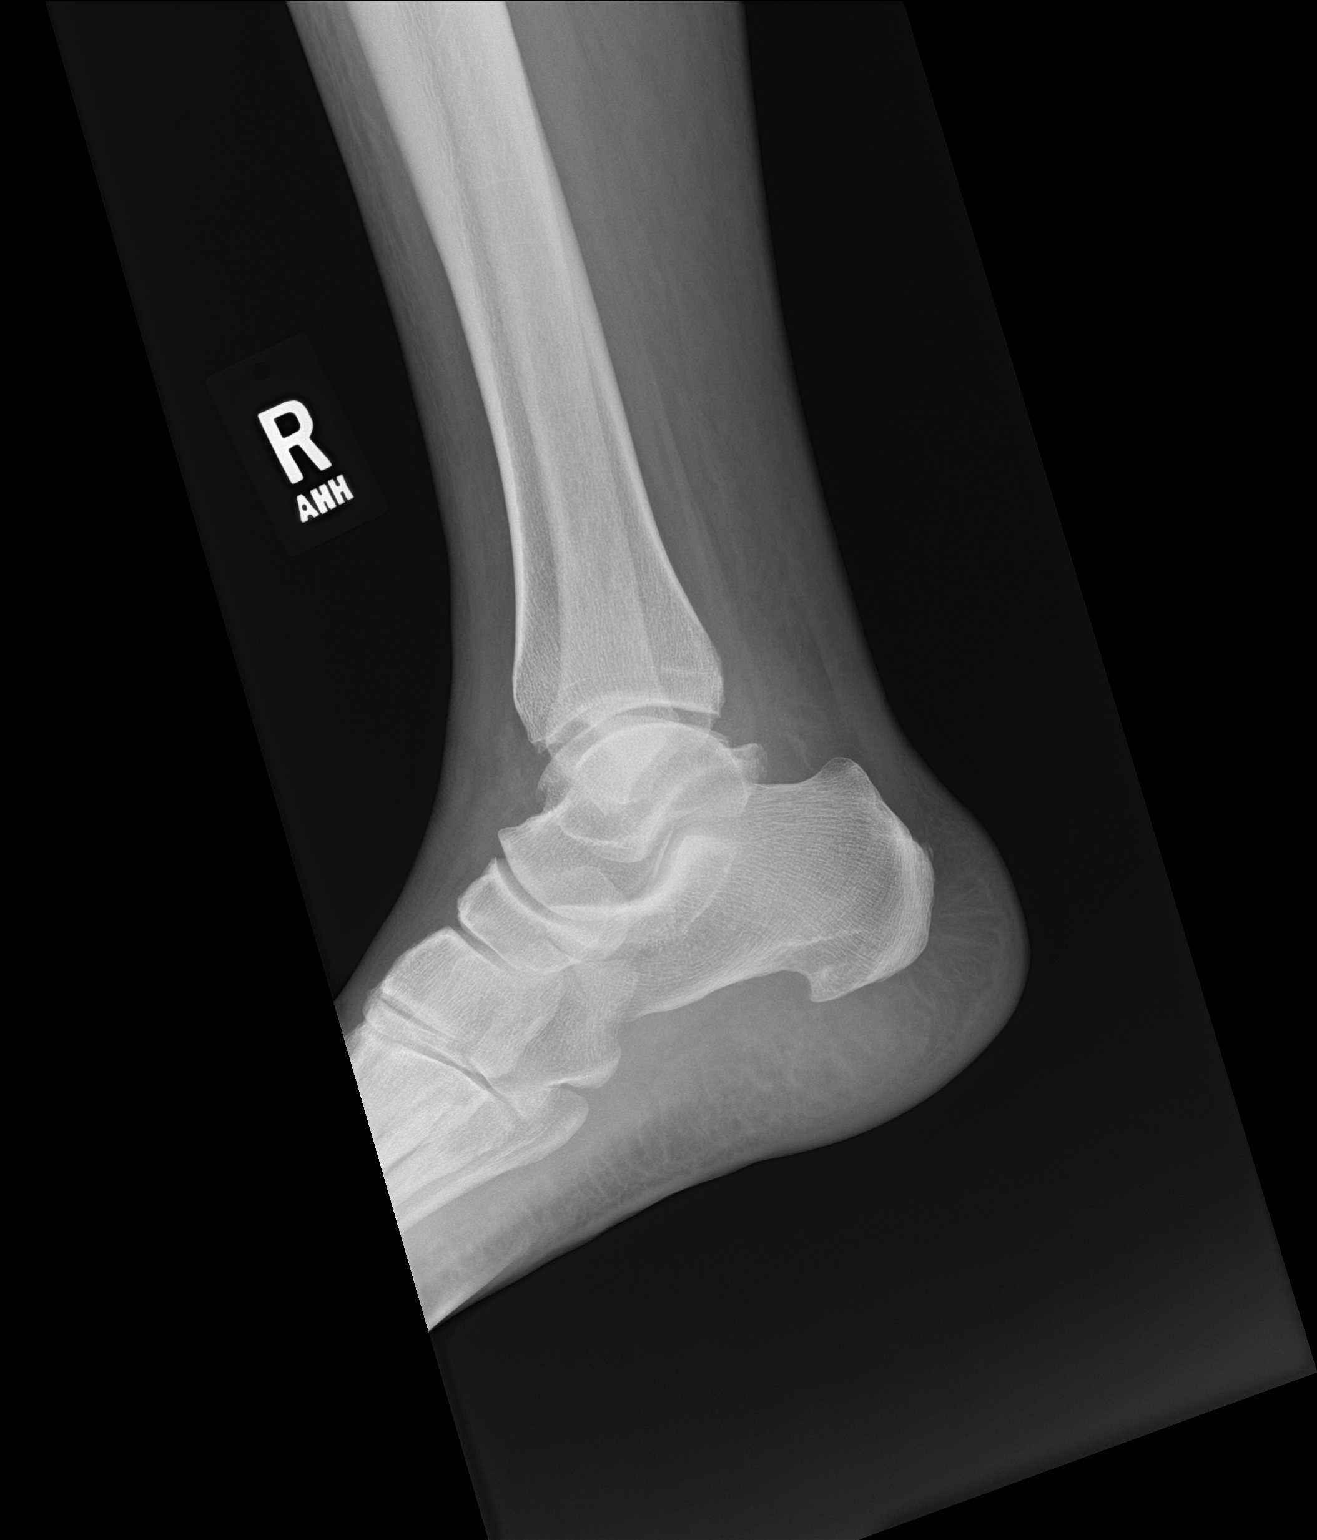

[3 of 3 positions shown; findings below may reference images not displayed]

FINDINGS: There is no evidence of fracture, dislocation, or joint effusion.
Mild anterior tibiotalar osteophytosis. Mild degenerative spurring
within the medial and lateral gutters. Soft tissues are
unremarkable.
IMPRESSION: Mild degenerative changes of the right ankle without acute findings.

## 2020-03-16 IMAGING — MR MR THORACIC SPINE W/O CM
6 series · 33 of 48 positions shown · non-contrast
Comparison: CTA chest [KN] hours today.

CLINICAL DATA: 49-year-old female with mid back pain. Generalized
body aches.

EXAM:
MRI THORACIC SPINE WITHOUT CONTRAST
TECHNIQUE: Multiplanar, multisequence MR imaging of the thoracic spine was
performed. No intravenous contrast was administered.

[Series 16: T1 · sagittal · 4.0mm · 1.72mm/px · 1 of 5 slices shown (1 of 2)]
[im 1/5]
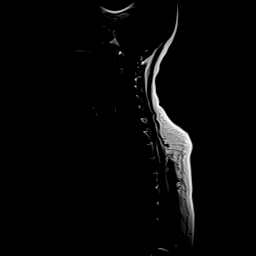

[Series 17: T2 · sagittal · 3.0mm · 0.91mm/px · 5 of 15 slices shown (1 of 2)]
[im 1/15]
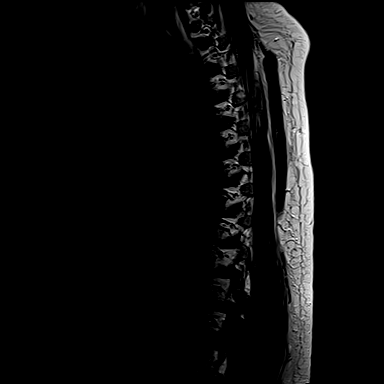
[im 4/15]
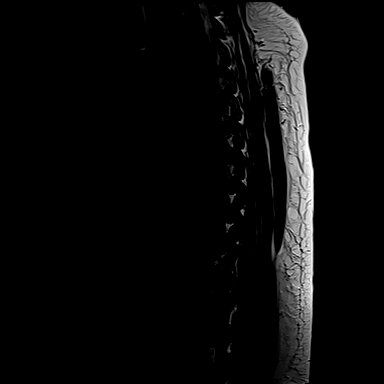
[im 8/15]
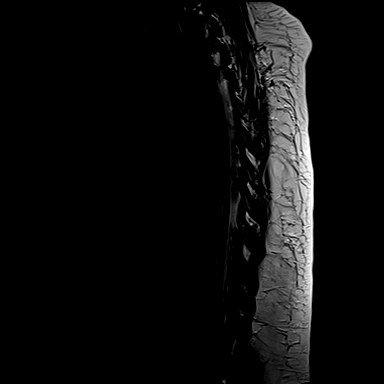
[im 11/15]
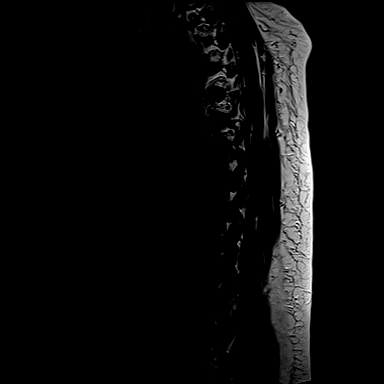
[im 15/15]
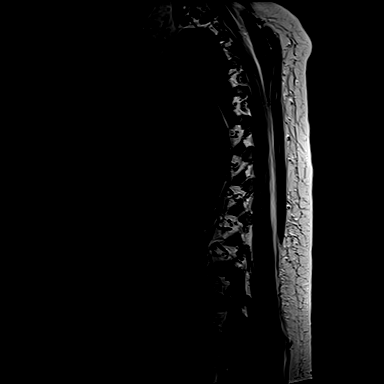

[Series 18: STIR · sagittal · 3.0mm · 1.09mm/px · 6 of 15 slices shown]
[im 1/15]
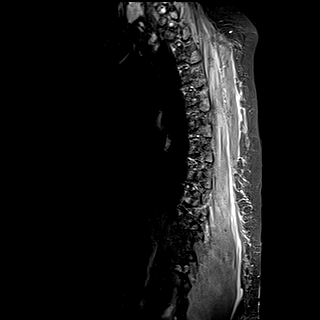
[im 3/15]
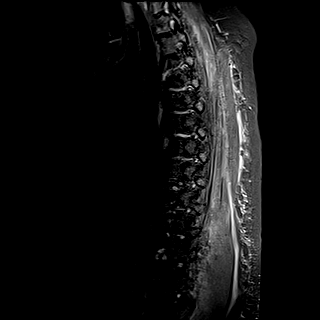
[im 6/15]
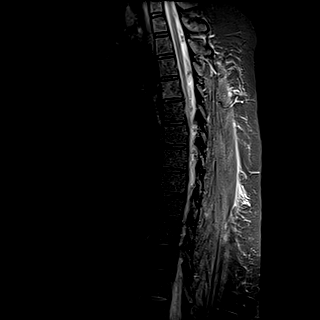
[im 9/15]
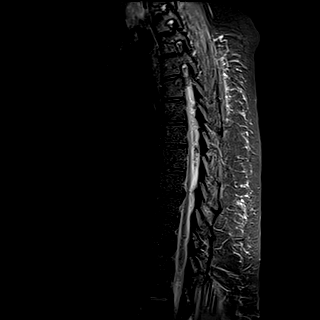
[im 12/15]
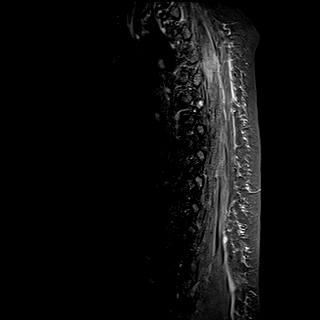
[im 15/15]
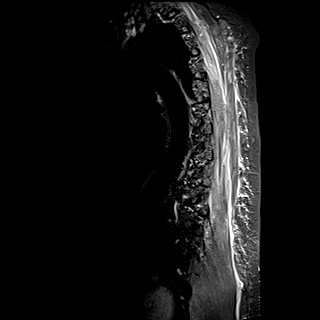

[Series 19: T1 · sagittal · 3.0mm · 1.09mm/px · 6 of 15 slices shown (2 of 2)]
[im 1/15]
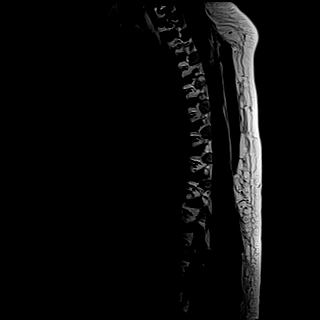
[im 3/15]
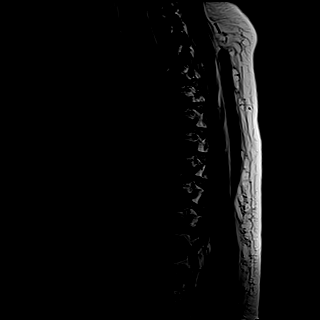
[im 6/15]
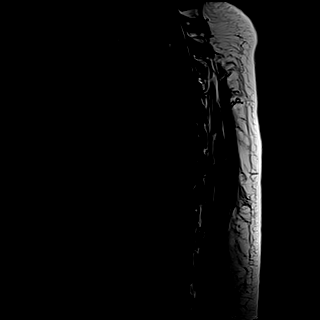
[im 9/15]
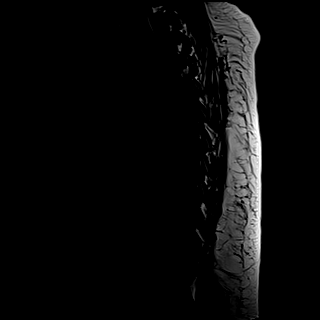
[im 12/15]
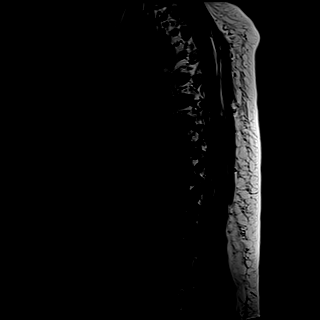
[im 15/15]
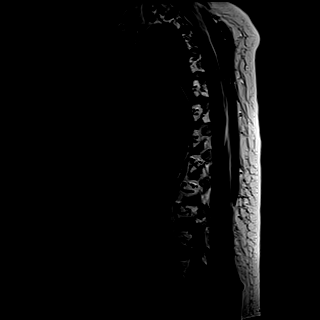

[Series 20: T2 · axial · 4.0mm · 0.78mm/px · z∈[-308,-18]mm · 9 of 39 slices shown (2 of 2)]
[im 1/39]
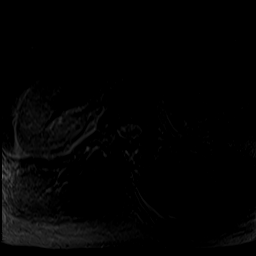
[im 6/39]
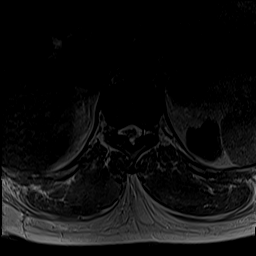
[im 11/39]
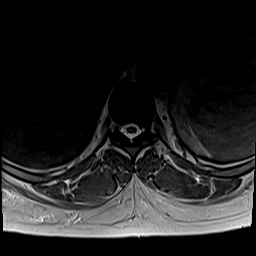
[im 17/39]
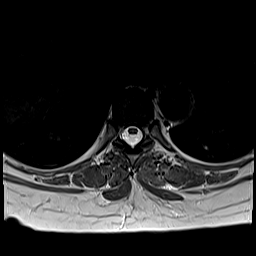
[im 20/39]
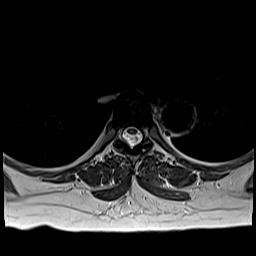
[im 22/39]
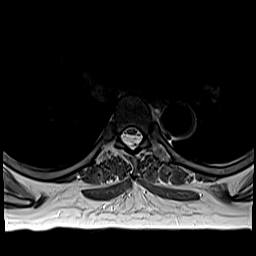
[im 28/39]
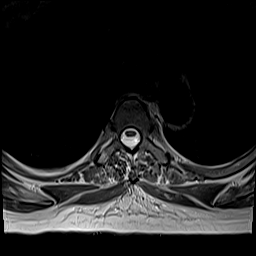
[im 33/39]
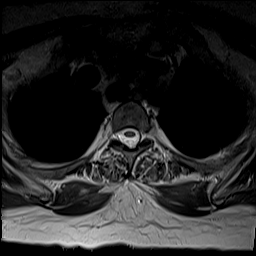
[im 39/39]
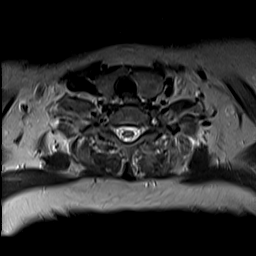

[Series 21: t2_me2d_tra · axial · 4.0mm · 0.39mm/px · z∈[-308,-92]mm · 6 of 39 slices shown]
[im 1/39]
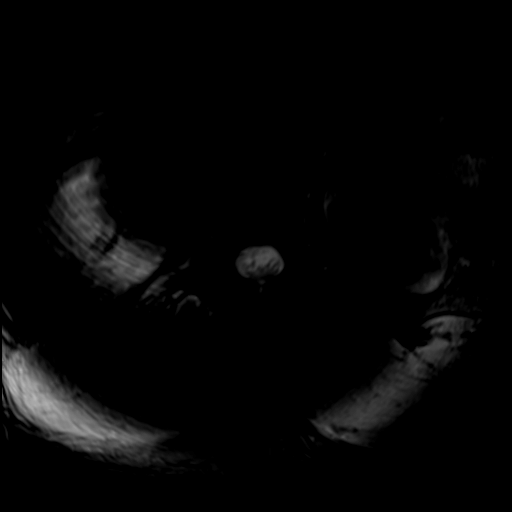
[im 6/39]
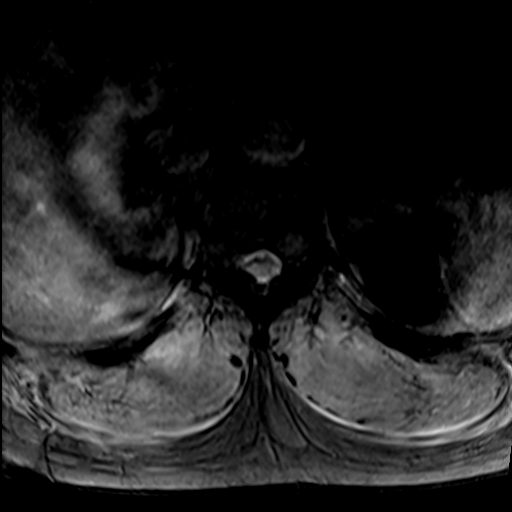
[im 11/39]
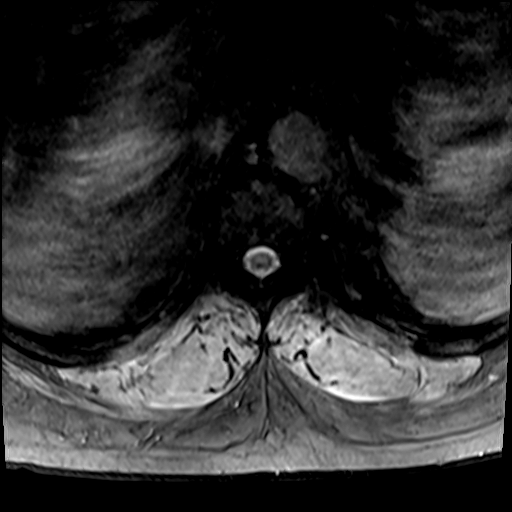
[im 17/39]
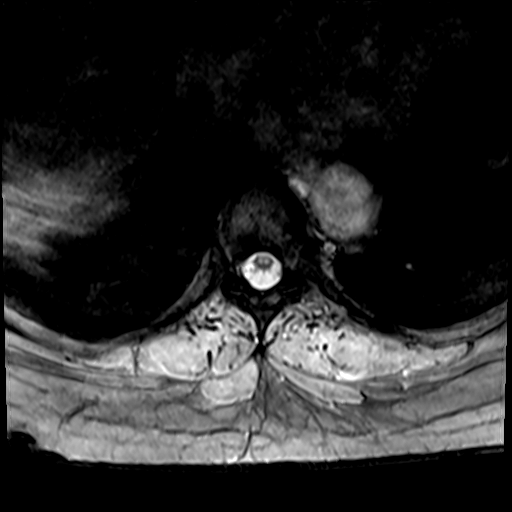
[im 22/39]
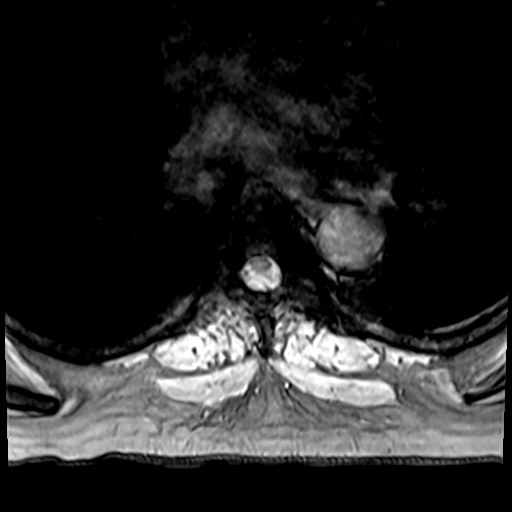
[im 28/39]
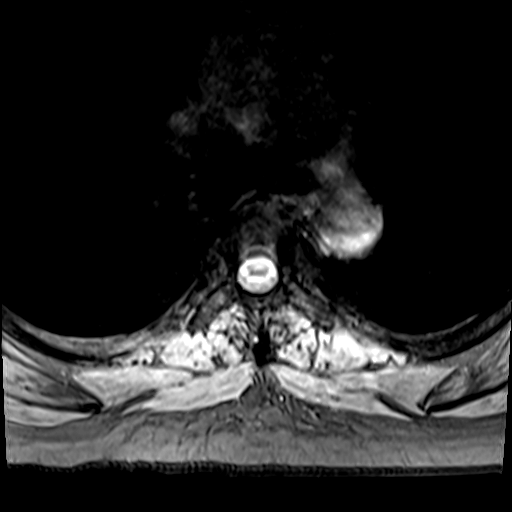

[33 of 48 positions shown; findings below may reference images not displayed]

FINDINGS: Limited cervical spine imaging:  Negative.

Thoracic spine segmentation:  Appears to be normal.

Alignment:  Relatively normal thoracic kyphosis.  No scoliosis.

Vertebrae: Background bone marrow signal is within normal limits.
Normal vertebral body signal. At the left T5 transverse process
there is a small area of increased T2 and STIR signal (series 18,
image 12), although comparison with the CT at this level favors a
benign osseous hemangioma (and on axial T2 the area does appear
somewhat stippled, series 20, image 15).

No other marrow edema or evidence of acute osseous abnormality.

Cord: Capacious thoracic spinal canal at most levels. At levels
where the canal is largest there is heterogeneous T2 and STIR signal
dorsal to the cord (series 18, image 8), but this appears to be CSF
pulsation artifact. Axial GRE images do not suggest any
space-occupying mass or abnormal vessels within the thecal sac.

No thoracic spinal cord signal abnormality identified. Conus
medullaris appears grossly normal at T11-T12.

Paraspinal and other soft tissues: Negative visible abdominal
viscera; there is some bowel in some of the visible left renal fossa
although a slightly low lying left kidney is suspected.

STIR images suggest there is abnormal increased STIR signal in the
bilateral thoracic paraspinal musculature (series 18, image 1 on the
right, image 15 on the left). This is subtle on other sequences. No
discrete muscle lesion. No paraspinal fluid collection or other
inflammation.

Disc levels:

The following thoracic degenerative changes are noted:

T1-T2: Negative.

T2-T3: Negative.

T3-T4: Negative.

T4-T5: Tiny right paracentral disc herniation (series 20, image 13)
effaces the ventral CSF space but there is no stenosis.

T5-T6: Mild to moderate facet or ligament flavum hypertrophy. But no
stenosis.

T6-T7: Mild to moderate facet or ligament flavum hypertrophy greater
on the right. No stenosis.

T7-T8: Negative.

T8-T9: Broad-based left paracentral disc protrusion (series 20,
image 26) effaces the ventral CSF space but there is no significant
stenosis.

T9-T10: Mild to moderate facet and/or ligament flavum hypertrophy
with mild to moderate bilateral T9 foraminal stenosis greater on the
left.

T10-T11: Mild to moderate facet hypertrophy and T10 foraminal
stenosis which is fairly symmetric.

T11-T12: Mild circumferential disc bulge. Mild to moderate posterior
element hypertrophy. Mild right T11 foraminal stenosis.

T12-L1: Negative.
IMPRESSION: 1. Possible generalized thoracic erector spinal muscle Myositis,
with suggestion of bilateral abnormal increased STIR signal. Other
paraspinal soft tissues are normal. No convincing acute osseous
abnormality (benign left T5 transverse process bone hemangioma
suspected).

2. Thoracic spinal cord appears normal. Heterogeneous signal in the
capacious dorsal spinal canal is felt due to CSF pulsation artifact.

3. Occasional small thoracic disc herniations but no spinal
stenosis. Up to moderate thoracic posterior element degeneration
with moderate bilateral T9 and T10 neural foraminal stenosis.

## 2020-03-16 MED ORDER — SODIUM CHLORIDE 0.9 % IV SOLN
100.0000 mg | Freq: Once | INTRAVENOUS | Status: AC
  Administered 2020-03-16: 100 mg via INTRAVENOUS
  Filled 2020-03-16: qty 100

## 2020-03-16 MED ORDER — ACETAMINOPHEN 325 MG PO TABS
650.0000 mg | ORAL_TABLET | Freq: Four times a day (QID) | ORAL | Status: DC | PRN
  Administered 2020-03-20: 650 mg via ORAL
  Filled 2020-03-16: qty 2

## 2020-03-16 MED ORDER — ENOXAPARIN SODIUM 40 MG/0.4ML ~~LOC~~ SOLN
40.0000 mg | SUBCUTANEOUS | Status: DC
  Administered 2020-03-16: 40 mg via SUBCUTANEOUS
  Filled 2020-03-16: qty 0.4

## 2020-03-16 MED ORDER — INSULIN ASPART 100 UNIT/ML ~~LOC~~ SOLN
0.0000 [IU] | Freq: Three times a day (TID) | SUBCUTANEOUS | Status: DC
  Administered 2020-03-16 – 2020-03-17 (×6): 3 [IU] via SUBCUTANEOUS
  Administered 2020-03-18 (×2): 2 [IU] via SUBCUTANEOUS
  Administered 2020-03-18 – 2020-03-19 (×3): 3 [IU] via SUBCUTANEOUS
  Administered 2020-03-19: 2 [IU] via SUBCUTANEOUS
  Administered 2020-03-20 (×2): 3 [IU] via SUBCUTANEOUS
  Administered 2020-03-20: 2 [IU] via SUBCUTANEOUS
  Administered 2020-03-21 (×2): 3 [IU] via SUBCUTANEOUS
  Administered 2020-03-21: 2 [IU] via SUBCUTANEOUS
  Administered 2020-03-22: 3 [IU] via SUBCUTANEOUS
  Administered 2020-03-22 (×2): 5 [IU] via SUBCUTANEOUS
  Administered 2020-03-23 (×2): 3 [IU] via SUBCUTANEOUS
  Administered 2020-03-24 (×3): 2 [IU] via SUBCUTANEOUS
  Administered 2020-03-25: 1 [IU] via SUBCUTANEOUS
  Administered 2020-03-25: 2 [IU] via SUBCUTANEOUS
  Administered 2020-03-26: 1 [IU] via SUBCUTANEOUS
  Administered 2020-03-26: 2 [IU] via SUBCUTANEOUS
  Administered 2020-03-26: 1 [IU] via SUBCUTANEOUS
  Administered 2020-03-27: 3 [IU] via SUBCUTANEOUS
  Administered 2020-03-27: 1 [IU] via SUBCUTANEOUS
  Administered 2020-03-27: 8 [IU] via SUBCUTANEOUS
  Administered 2020-03-28: 1 [IU] via SUBCUTANEOUS
  Administered 2020-03-28: 7 [IU] via SUBCUTANEOUS
  Administered 2020-03-28 – 2020-03-29 (×2): 3 [IU] via SUBCUTANEOUS
  Administered 2020-03-29: 2 [IU] via SUBCUTANEOUS
  Administered 2020-03-29: 9 [IU] via SUBCUTANEOUS
  Administered 2020-03-30: 1 [IU] via SUBCUTANEOUS
  Administered 2020-03-30: 9 [IU] via SUBCUTANEOUS

## 2020-03-16 MED ORDER — SENNOSIDES-DOCUSATE SODIUM 8.6-50 MG PO TABS
1.0000 | ORAL_TABLET | Freq: Two times a day (BID) | ORAL | Status: DC
  Administered 2020-03-16 – 2020-03-31 (×27): 1 via ORAL
  Filled 2020-03-16 (×31): qty 1

## 2020-03-16 MED ORDER — LACTATED RINGERS IV SOLN: INTRAVENOUS | Status: DC

## 2020-03-16 MED ORDER — ACETAMINOPHEN 650 MG RE SUPP: 650.0000 mg | Freq: Four times a day (QID) | RECTAL | Status: DC | PRN

## 2020-03-16 MED ORDER — LACTATED RINGERS IV SOLN: INTRAVENOUS | Status: AC

## 2020-03-16 MED ORDER — FENTANYL CITRATE (PF) 100 MCG/2ML IJ SOLN
12.5000 ug | INTRAMUSCULAR | Status: DC | PRN
  Administered 2020-03-16 – 2020-03-23 (×19): 12.5 ug via INTRAVENOUS
  Filled 2020-03-16 (×21): qty 2

## 2020-03-16 MED ORDER — SODIUM CHLORIDE (PF) 0.9 % IJ SOLN
INTRAMUSCULAR | Status: AC
  Filled 2020-03-16: qty 50

## 2020-03-16 MED ORDER — IOHEXOL 350 MG/ML SOLN
100.0000 mL | Freq: Once | INTRAVENOUS | Status: AC | PRN
  Administered 2020-03-16: 100 mL via INTRAVENOUS

## 2020-03-16 MED ORDER — NYSTATIN 100000 UNIT/GM EX POWD
Freq: Two times a day (BID) | CUTANEOUS | Status: DC
  Administered 2020-03-21 – 2020-03-27 (×3): 1 via TOPICAL
  Filled 2020-03-16 (×3): qty 15

## 2020-03-16 NOTE — H&P (Addendum)
History and Physical    Kaitlin Holloway CHY:[id number] DOB: 1970/11/16 DOA: 03/15/2020  PCP: Julieanne Manson, MD  Patient coming from: Home.  Chief Complaint: Shortness of breath.  HPI: Kaitlin Holloway is a 49 y.o. female with history of diabetes mellitus type 2 last hemoglobin A1c was around more than 12 around 500 days ago has not been taking her medications presents to the ER because of shortness of breath.  Patient shortness of breath started last evening around 4 PM while at home.  Patient states since March 2020 patient developed upper respiratory tract infection which patient assumed it could be COVID-19 infection but no definite evidence for that since then patient has been having generalized body ache joint swelling of the extremities.  Which is progressively got worse.  Has developed joint swelling of the lower extremities particularly.  Patient also over the last 3 weeks after her cataract surgery recent started to notice rash on her thighs.  Which are itching.  Patient takes ibuprofen for her generalized body ache and joint swelling.  Patient has stiffness of upper extremities and cannot make a fist because of the swelling.  ED Course: In the ER CT angiogram of the chest was negative for anything acute EKG shows nonspecific changes.  High sensitive troponins were negative CK levels were at around 200 D-dimer was marked elevated over 9.  Labs are significant for blood glucose of 285 WBC count of 19.2.  On exam patient has severe pain on minimal movement.  The rash on the thighs are nonblanching.  Denies any sick contacts or recent travel.  Review of Systems: As per HPI, rest all negative.   Past Medical History:  Diagnosis Date  . Arthritis    knee  . COVID-19 06/2018   Residual joint pain  . Diabetes mellitus without complication (HCC)    Type 2, does not take her meds.  . Hypertension 2002  . Joint pain    since having COVID 06/2018  . Motion sickness    boats, car - back  seat  . Prediabetes 2016    Past Surgical History:  Procedure Laterality Date  . ABDOMINAL HYSTERECTOMY  2016   unilateral oophorectomy.  Does not know what side.  For fibroids and heavy bleeding.  . APPENDECTOMY     age 89  . CATARACT EXTRACTION W/PHACO Right 01/28/2020   Procedure: CATARACT EXTRACTION PHACO AND INTRAOCULAR LENS PLACEMENT (IOC) RIGHT VISION BLUE 13.73  01:10.8;  Surgeon: Elliot Cousin, MD;  Location: [facility];  Service: Ophthalmology;  Laterality: Right;  Diabetes Latex  . TONSILLECTOMY       reports that she quit smoking about 19 years ago. Her smoking use included cigarettes. She has a 25.50 pack-year smoking history. She has never used smokeless tobacco. She reports that she does not drink alcohol and does not use drugs.  Allergies  Allergen Reactions  . Penicillins Other (See Comments)    Has patient had a PCN reaction causing immediate rash, facial/tongue/throat swelling, SOB or lightheadedness with hypotension: Yes Has patient had a PCN reaction causing severe rash involving mucus membranes or skin necrosis: No Has patient had a PCN reaction that required hospitalization: No Has patient had a PCN reaction occurring within the last 10 years: No If all of the above answers are "NO", then may proceed with Cephalosporin use.   . Aspirin Other (See Comments)    Muscle spasms in back  . Garlic Swelling  . Morphine And Related Other (See Comments)  Headaches/ migraine  . Latex Rash    Gloves - when worn    Family History  Problem Relation Age of Onset  . Heart disease Mother 92       AMI  . Diabetes Mother   . Hypertension Mother   . Hyperlipidemia Mother     Prior to Admission medications   Medication Sig Start Date End Date Taking? Authorizing Provider  ibuprofen (ADVIL) 400 MG tablet Take 400 mg by mouth every 6 (six) hours as needed for moderate pain.    Yes [provider]  glipiZIDE (GLUCOTROL) 5 MG tablet 1 tab by mouth  twice daily with meals Patient not taking: Reported on 01/25/2020 03/04/19   Julieanne Manson, MD  lisinopril (ZESTRIL) 10 MG tablet Take 1 tablet (10 mg total) by mouth daily. Patient not taking: Reported on 03/16/2020 08/27/19   Julieanne Manson, MD  metFORMIN (GLUCOPHAGE-XR) 500 MG 24 hr tablet 1 tab by mouth twice daily with meals Patient not taking: Reported on 01/25/2020 03/04/19   Julieanne Manson, MD  lisinopril-hydrochlorothiazide (ZESTORETIC) 20-12.5 MG tablet Take 1 tablet by mouth daily. Patient not taking: Reported on 07/11/2018 12/06/17 08/14/19  Azalia Bilis, MD    Physical Exam: Constitutional: Moderately built and nourished. Vitals:   03/16/20 0440 03/16/20 0500 03/16/20 0539 03/16/20 0601  BP: (!) 149/93 (!) 150/92  139/84  Pulse: 82 76  76  Resp: 17 17  (!) 22  Temp:    98.5 F (36.9 C)  TempSrc:    Oral  SpO2: 99% 99%  99%  Weight:   99.8 kg   Height:   6\' 2"  (1.88 m)    Eyes: Anicteric no pallor. ENMT: No discharge from the ears eyes nose or mouth. Neck: No JVD appreciated no mass felt. Respiratory: No rhonchi or crepitations. Cardiovascular: S1-S2 heard. Abdomen: Soft nontender bowel sounds present. Musculoskeletal: Bilateral lower extremity edema present. Skin: Rash on the thigh which are nonblanching. Neurologic: Alert awake oriented time place and person.  Moves all extremities. Psychiatric: Appears normal.  Normal affect.   Labs on Admission: I have personally reviewed following labs and imaging studies  CBC: Recent Labs  Lab 03/16/20 0004  WBC 19.2*  NEUTROABS 4.9  HGB 13.8  HCT 38.9  MCV 84.9  PLT 214   Basic Metabolic Panel: Recent Labs  Lab 03/16/20 0004  NA 135  K 4.0  CL 100  CO2 24  GLUCOSE 285*  BUN 17  CREATININE 0.73  CALCIUM 8.8*   GFR: Estimated Creatinine Clearance: 114.9 mL/min (by C-G formula based on SCr of 0.73 mg/dL). Liver Function Tests: Recent Labs  Lab 03/16/20 0004  AST 38  ALT 44  ALKPHOS 107   BILITOT 0.9  PROT 6.6  ALBUMIN 3.1*   No results for input(s): LIPASE, AMYLASE in the last 168 hours. No results for input(s): AMMONIA in the last 168 hours. Coagulation Profile: No results for input(s): INR, PROTIME in the last 168 hours. Cardiac Enzymes: Recent Labs  Lab 03/16/20 0004  CKTOTAL 1,219*   BNP (last 3 results) No results for input(s): PROBNP in the last 8760 hours. HbA1C: No results for input(s): HGBA1C in the last 72 hours. CBG: No results for input(s): GLUCAP in the last 168 hours. Lipid Profile: No results for input(s): CHOL, HDL, LDLCALC, TRIG, CHOLHDL, LDLDIRECT in the last 72 hours. Thyroid Function Tests: No results for input(s): TSH, T4TOTAL, FREET4, T3FREE, THYROIDAB in the last 72 hours. Anemia Panel: No results for input(s): VITAMINB12, FOLATE, FERRITIN, TIBC,  IRON, RETICCTPCT in the last 72 hours. Urine analysis:    Component Value Date/Time   BILIRUBINUR neg 09/08/2018 1522   PROTEINUR Negative 09/08/2018 1522   UROBILINOGEN 0.2 09/08/2018 1522   NITRITE neg 09/08/2018 1522   LEUKOCYTESUR Negative 09/08/2018 1522   Sepsis Labs: @LABRCNTIP (procalcitonin:4,lacticidven:4) ) Recent Results (from the past 240 hour(s))  Resp Panel by RT-PCR (Flu A&B, Covid) Nasopharyngeal Swab     Status: None   Collection Time: 03/16/20 12:04 AM   Specimen: Nasopharyngeal Swab; Nasopharyngeal(NP) swabs in vial transport medium  Result Value Ref Range Status   SARS Coronavirus 2 by RT PCR NEGATIVE NEGATIVE Final    Comment: (NOTE) SARS-CoV-2 target nucleic acids are NOT DETECTED.  The SARS-CoV-2 RNA is generally detectable in upper respiratory specimens during the acute phase of infection. The lowest concentration of SARS-CoV-2 viral copies this assay can detect is 138 copies/mL. A negative result does not preclude SARS-Cov-2 infection and should not be used as the sole basis for treatment or other patient management decisions. A negative result may occur  with  improper specimen collection/handling, submission of specimen other than nasopharyngeal swab, presence of viral mutation(s) within the areas targeted by this assay, and inadequate number of viral copies(<138 copies/mL). A negative result must be combined with clinical observations, patient history, and epidemiological information. The expected result is Negative.  Fact Sheet for Patients:  03/18/20  Fact Sheet for Healthcare Providers:  BloggerCourse.com  This test is no t yet approved or cleared by the [employer] FDA and  has been authorized for detection and/or diagnosis of SARS-CoV-2 by FDA under an Emergency Use Authorization (EUA). This EUA will remain  in effect (meaning this test can be used) for the duration of the COVID-19 declaration under Section 564(b)(1) of the Act, 21 U.S.C.section 360bbb-3(b)(1), unless the authorization is terminated  or revoked sooner.       Influenza A by PCR NEGATIVE NEGATIVE Final   Influenza B by PCR NEGATIVE NEGATIVE Final    Comment: (NOTE) The Xpert Xpress SARS-CoV-2/FLU/RSV plus assay is intended as an aid in the diagnosis of influenza from Nasopharyngeal swab specimens and should not be used as a sole basis for treatment. Nasal washings and aspirates are unacceptable for Xpert Xpress SARS-CoV-2/FLU/RSV testing.  Fact Sheet for Patients: [place]  Fact Sheet for Healthcare Providers: BloggerCourse.com  This test is not yet approved or cleared by the [employer]ious[employer].[place] FDA and has been authorized for detection and/or diagnosis of SARS-CoV-2 by FDA under an Emergency Use Authorization (EUA). This EUA will remain in effect (meaning this test can be used) for the duration of the COVID-19 declaration under Section 564(b)(1) of the Act, 21 U.S.C. section 360bbb-3(b)(1), unless the authorization is terminated  or revoked.  Performed at [facility], [address] [place], [place]      Radiological Exams on Admission: CT Angio Chest PE W and/or Wo Contrast  Result Date: 03/16/2020 CLINICAL DATA:  Severe joint pain from neck down to the feet. Negative COVID test. EXAM: CT ANGIOGRAPHY CHEST WITH CONTRAST TECHNIQUE: Multidetector CT imaging of the chest was performed using the standard protocol during bolus administration of intravenous contrast. Multiplanar CT image reconstructions and MIPs were obtained to evaluate the vascular anatomy. CONTRAST:  03/18/2020 OMNIPAQUE IOHEXOL 350 MG/ML SOLN COMPARISON:  None. FINDINGS: Cardiovascular: Examination is limited by respiratory motion artifact. Within that limitation, there is no central pulmonary embolus. The size of the main pulmonary artery is normal. Heart size is normal, with  no pericardial effusion. The course and caliber of the aorta are normal. There is no atherosclerotic calcification. Opacification decreased due to pulmonary arterial phase contrast bolus timing. Mediastinum/Nodes: -- No mediastinal lymphadenopathy. -- No hilar lymphadenopathy. -- No axillary lymphadenopathy. -- No supraclavicular lymphadenopathy. --there is a left-sided thyroid nodule measuring 1 cm. No followup recommended (ref: J Am Coll Radiol. 2015 Feb;12(2): 143-50). -  Unremarkable esophagus. Lungs/Pleura: Airways are patent. No pleural effusion, lobar consolidation, pneumothorax or pulmonary infarction. Upper Abdomen: Contrast bolus timing is not optimized for evaluation of the abdominal organs. The visualized portions of the organs of the upper abdomen are normal. Musculoskeletal: No chest wall abnormality. No bony spinal canal stenosis. Review of the MIP images confirms the above findings. IMPRESSION: 1. Examination is limited by respiratory motion artifact. Within that limitation, there is no central pulmonary embolus. 2. No acute cardiopulmonary process.  Electronically Signed   By: Katherine Mantle M.D.   On: 03/16/2020 03:46   DG Chest Portable 1 View  Result Date: 03/16/2020 CLINICAL DATA:  Shortness of breath EXAM: PORTABLE CHEST 1 VIEW COMPARISON:  None. FINDINGS: The heart size is mildly enlarged. Aortic calcifications are noted. There is no pneumothorax. No significant pleural effusion. No focal infiltrate. IMPRESSION: No active disease. Electronically Signed   By: Katherine Mantle M.D.   On: 03/16/2020 00:11    EKG: Independently reviewed.  Normal sinus rhythm with nonspecific ST changes.  Assessment/Plan Principal Problem:   Dyspnea Active Problems:   Polyarthralgia   Hyperglycemia   Elevated CK    1. Dyspnea -present even at rest.  Increased on deep inspiration and her breathing is constrained because of the pain caused by taking deep breaths.  CT angiogram was negative for anything acute.  Will check 2D echo.  No definite evidence of any pericarditis at this time. 2. Generalized body ache with the polyarthralgia and bilateral ankle swelling and stiffness of the joints and hands with elevated CK levels and rash on the thighs.  Concerning for rheumatological process.  Will check ANA rheumatoid factor anti-CCP antibodies anti-Jo antibodies sed rate and CRP.  Will need rheumatology output eventually. 3. Elevated CK levels could be from rheumatological process like myositis.  The rash may need biopsy.  Presently receiving IV fluids. 4. Bilateral lower extremity edema likely from neurologic process will check Dopplers without DVT. 5. Diabetes mellitus type 2 has not been taking her medicines since last here after there was a recall for Metformin.  We will keep patient on sliding scale coverage check hemoglobin A1c closely follow CBGs. 6. Patient was empirically placed on doxycycline for lower extremity cellulitis by the ER physician which may discontinue if procalcitonin is negative. 7. Leukocytosis check blood cultures follow  CBC.   DVT prophylaxis: Lovenox. Code Status: Full code. Family Communication: Discussed with patient. Disposition Plan: Home. Consults called: None. Admission status: Observation.   Eduard Clos MD [facility] Pager [phone].  If 7PM-7AM, please contact night-coverage [email or web address] Password [facility]  03/16/2020, 6:54 AM

## 2020-03-16 NOTE — Progress Notes (Addendum)
Kaitlin Holloway reported she thinks she contracted Covid March 2020, she is state was in perfect health condition doing regular exercise a few times a week prior to that.  However after March 2020 she started to have diffuse joint pain muscle aches, suffered knee and ankle injury, is starting to take her long time to do everything she is start working 6:00 in the morning has to get up at 3:00 to get ready.  Yesterday she started to have sudden onset of per back pain radiating to both feet, the pain took her breath away she came to the hospital.  On exam she does has bilateral ankle edema right greater than left, has bilateral groin rash, raised. Bilateral hands mcp and pip joint tenderness, not able to make a tight fist, bilateral shoulder full range of motion but very slow, mid thoracic pointtenderness CTA "No acute cardiopulmonary process" Echo pending Venous doppler pending Order :Mri thoracic spine Bilateral ankle x xray Will get pt eval tomorrow No insurance, case manager consulted

## 2020-03-16 NOTE — Progress Notes (Signed)
  Echocardiogram 2D Echocardiogram has been performed.  Amed Datta G Maccoy Haubner 03/16/2020, 2:05 PM

## 2020-03-16 NOTE — ED Notes (Signed)
Pt returned from CT at this time.  

## 2020-03-16 NOTE — Progress Notes (Signed)
Bilateral lower extremity venous duplex has been completed. Preliminary results can be found in CV Proc through chart review.   03/16/20 11:22 AM Olen Cordial RVT

## 2020-03-16 NOTE — Progress Notes (Signed)
Inpatient Diabetes Program Recommendations  AACE/ADA: New Consensus Statement on Inpatient Glycemic Control  Target Ranges:  Prepandial:   less than 140 mg/dL      Peak postprandial:   less than 180 mg/dL (1-2 hours)      Critically ill patients:  140 - 180 mg/dL   Results for Kaitlin Holloway, Kaitlin Holloway (MRN [id number]) as of 03/16/2020 12:59  Ref. Range 03/16/2020 08:00 03/16/2020 12:18  Glucose-Capillary Latest Ref Range: 70 - 99 mg/dL 627 (H) 035 (H)   Review of Glycemic Control  Diabetes history: DM2 Outpatient Diabetes medications: None Current orders for Inpatient glycemic control: Novolog 0-9 units TID with meals  Inpatient Diabetes Program Recommendations:    DM medications: Please consider ordering Glipizide 5 mg BID.  HbgA1C:  A1C 9.7% on 03/16/20 indicating an average glucose of 232 mg/dl over the past 2-3 months. At time of discharge, consider prescribing Glipizide as patient is willing to try it again as an outpatient.  NOTE: Spoke with patient over the phone about diabetes and home regimen for diabetes control. Patient reports that she has no insurance and she has not seen a primary care provider in over a year. Patient reports that she was prescribed Metformin and Glipizide in the past which she took for a short time then stopped taking it because of it making her "feel sick all the time".  Inquired about sick feeling and patient reports that it made her feel nauseous and have diarrhea. Discussed Metformin and Glipizide and explained that it may have been the Metformin causing GI intolerance. Discussed how Glipizide works and reviewed risk of hypoglycemia with that medication. Patient states that she has experienced hypoglycemia in the past when she was taking the medication. Patient states that she does not check glucose at home. She has a glucometer and testing supplies but she does not check due to the pain she has from finger sticks. Patient states she is not willing to check glucose  more than one or two times a week. Patient reports that she is following a carb modified diet and she has been working hard to change dietary habits to help improve glycemic control.  Patient states that her last A1C over a year ago was 12% and she was not taking any DM medications at that time.  Discussed A1C results (9.7% on 03/15/20) and explained that current A1C indicates an average glucose of 232 mg/dl over the past 2-3 months. Discussed glucose and A1C goals. Discussed importance of checking CBGs and maintaining good CBG control to prevent long-term and short-term complications. Explained how hyperglycemia leads to damage within blood vessels which lead to the common complications seen with uncontrolled diabetes. Stressed to the patient the importance of improving glycemic control to prevent further complications from uncontrolled diabetes. Asked patient if she would be willing restart DM medications and patient states she would be willing to try Glipizide again but she did not want to take the Metformin. Encouraged patient to try to check glucose at least once a day to help follow up providers to make adjustments with DM medications if needed. Patient would like to get established at a local clinic and she inquired about someone from the hospital helping her with signing up for Medicaid. Informed patient that [facility] consult would be ordered for assistance with follow up, medications, and applying for Medicaid.  Patient verbalized understanding of information discussed and reports no further questions at this time related to diabetes.  Thanks, Orlando Penner, RN, MSN, CDE Diabetes Coordinator Inpatient Diabetes  Program [phone] Development worker, international aid)

## 2020-03-16 NOTE — ED Provider Notes (Signed)
Emergency Department Provider Note   I have reviewed the triage vital signs and the nursing notes.   HISTORY  Chief Complaint Joint Pain   HPI Kaitlin Holloway is a 49 y.o. female with PMH of COVID 19, DM, HTN, and chronic polyarthralgia presents to the emergency department with continued joint pain but new onset difficulty breathing.  Symptoms the patient's joints have continued to be intermittently painful and are limiting her mobility.  She is able to work but states that she gets very little sleep because she has to start her routine very early.  She is been taking ibuprofen with only mild relief in symptoms.  She states that she has been managing at home but that over the past 1 to 2 days she developed shortness of breath which is worsened significantly today.  She denies anterior chest pain but does feel some pressure and discomfort between the shoulder blades posteriorly.  She denies any fevers.  Patient also states she is concerned because she is developed some rash in the back of her thighs which is painful which she suspects has spread from a presumed fungal infection in both feet.  She denies fevers or shaking chills.  Pain is severe and worse with movement.  No other modifying factors.   Past Medical History:  Diagnosis Date  . Arthritis    knee  . COVID-19 06/2018   Residual joint pain  . Diabetes mellitus without complication (HCC)    Type 2, does not take her meds.  . Hypertension 2002  . Joint pain    since having COVID 06/2018  . Motion sickness    boats, car - back seat  . Prediabetes 2016    Patient Active Problem List   Diagnosis Date Noted  . Dyspnea 03/16/2020  . Polyarthralgia 03/16/2020  . Hyperglycemia 03/16/2020  . Elevated CK 03/16/2020  . 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 Thyromegaly 09/09/2018  . Prediabetes   . Hypertension 04/23/2000    Past Surgical History:  Procedure Laterality Date  . ABDOMINAL HYSTERECTOMY  2016    unilateral oophorectomy.  Does not know what side.  For fibroids and heavy bleeding.  . APPENDECTOMY     age 65  . CATARACT EXTRACTION W/PHACO Right 01/28/2020   Procedure: CATARACT EXTRACTION PHACO AND INTRAOCULAR LENS PLACEMENT (IOC) RIGHT VISION BLUE 13.73  01:10.8;  Surgeon: Elliot Cousin, MD;  Location: [facility];  Service: Ophthalmology;  Laterality: Right;  Diabetes Latex  . TONSILLECTOMY      Allergies Penicillins, Aspirin, Garlic, Morphine and related, and Latex  Family History  Problem Relation Age of Onset  . Heart disease Mother 27       AMI  . Diabetes Mother   . Hypertension Mother   . Hyperlipidemia Mother     Social History Social History   Tobacco Use  . Smoking status: Former Smoker    Packs/day: 1.50    Years: 17.00    Pack years: 25.50    Types: Cigarettes    Quit date: 07/10/2000    Years since quitting: 19.6  . Smokeless tobacco: Never Used  Vaping Use  . Vaping Use: Never used  Substance Use Topics  . Alcohol use: No    Comment: rare  . Drug use: No    Comment: MJ when young    Review of Systems  Constitutional: No fever/chills Eyes: No visual changes. ENT: No sore throat. Cardiovascular: Positive posterior chest pain. Respiratory: Positive shortness of breath. Gastrointestinal: No abdominal  pain.  No nausea, no vomiting.  No diarrhea.  No constipation. Genitourinary: Negative for dysuria. Musculoskeletal: Negative for back pain. Positive diffuse joint pain.  Skin: Rash to the backs of legs bilaterally.  Neurological: Negative for headaches, focal weakness or numbness.  10-point ROS otherwise negative.  ____________________________________________   PHYSICAL EXAM:  VITAL SIGNS: ED Triage Vitals  Enc Vitals Group     BP 03/15/20 2249 (!) 160/79     Pulse Rate 03/15/20 2249 87     Resp 03/15/20 2249 15     Temp 03/15/20 2249 98.3 F (36.8 C)     Temp Source 03/15/20 2249 Oral     SpO2 03/15/20 2249 100 %    Constitutional: Alert and oriented. Well appearing and in no acute distress. Eyes: Conjunctivae are normal.  Head: Atraumatic. Nose: No congestion/rhinnorhea. Mouth/Throat: Mucous membranes are moist.  Oropharynx non-erythematous. Neck: No stridor.   Cardiovascular: Normal rate, regular rhythm. Good peripheral circulation. Grossly normal heart sounds.   Respiratory: Normal respiratory effort.  No retractions. Lungs CTAB. Gastrointestinal: Soft and nontender. No distention.  Musculoskeletal: No lower extremity tenderness nor edema. No gross deformities of extremities.  No unilateral or focal joint swelling or redness.  No deformity.  Neurologic:  Normal speech and language. No gross focal neurologic deficits are appreciated.  Skin:  Skin is warm and dry.  Patchy, dark red discoloration to the backs of both upper thighs.  No ulcerations, abscess, warmth to touch.  ____________________________________________   LABS (all labs ordered are listed, but only abnormal results are displayed)  Labs Reviewed  COMPREHENSIVE METABOLIC PANEL - Abnormal; Notable for the following components:      Result Value   Glucose, Bld 285 (*)    Calcium 8.8 (*)    Albumin 3.1 (*)    All other components within normal limits  CBC WITH DIFFERENTIAL/PLATELET - Abnormal; Notable for the following components:   WBC 19.2 (*)    Eosinophils Absolute 12.0 (*)    All other components within normal limits  CK - Abnormal; Notable for the following components:   Total CK 1,219 (*)    All other components within normal limits  D-DIMER, QUANTITATIVE (NOT AT [facility]) - Abnormal; Notable for the following components:   D-Dimer, Quant 9.65 (*)    All other components within normal limits  COMPREHENSIVE METABOLIC PANEL - Abnormal; Notable for the following components:   Sodium 134 (*)    Glucose, Bld 237 (*)    Calcium 8.3 (*)    Total Protein 5.6 (*)    Albumin 2.8 (*)    All other components within normal limits   CBC WITH DIFFERENTIAL/PLATELET - Abnormal; Notable for the following components:   WBC 18.2 (*)    Eosinophils Absolute 12.1 (*)    All other components within normal limits  CK - Abnormal; Notable for the following components:   Total CK 773 (*)    All other components within normal limits  C-REACTIVE PROTEIN - Abnormal; Notable for the following components:   CRP 3.5 (*)    All other components within normal limits  GLUCOSE, CAPILLARY - Abnormal; Notable for the following components:   Glucose-Capillary 222 (*)    All other components within normal limits  SEDIMENTATION RATE - Abnormal; Notable for the following components:   Sed Rate 28 (*)    All other components within normal limits  HEMOGLOBIN A1C - Abnormal; Notable for the following components:   Hgb A1c MFr Bld 9.7 (*)  All other components within normal limits  GLUCOSE, CAPILLARY - Abnormal; Notable for the following components:   Glucose-Capillary 204 (*)    All other components within normal limits  GLUCOSE, CAPILLARY - Abnormal; Notable for the following components:   Glucose-Capillary 237 (*)    All other components within normal limits  GLUCOSE, CAPILLARY - Abnormal; Notable for the following components:   Glucose-Capillary 232 (*)    All other components within normal limits  RESP PANEL BY RT-PCR (FLU A&B, COVID) ARPGX2  CULTURE, BLOOD (ROUTINE X 2)  CULTURE, BLOOD (ROUTINE X 2)  BRAIN NATRIURETIC PEPTIDE  PATHOLOGIST SMEAR REVIEW  HIV ANTIBODY (ROUTINE TESTING W REFLEX)  TSH  PROCALCITONIN  ANA  RHEUMATOID FACTOR  CYCLIC CITRUL PEPTIDE ANTIBODY, IGG/IGA  ANTI-JO 1 ANTIBODY, IGG  BASIC METABOLIC PANEL  MAGNESIUM  CBC WITH DIFFERENTIAL/PLATELET  CYTOLOGY - NON PAP  TROPONIN I (HIGH SENSITIVITY)  TROPONIN I (HIGH SENSITIVITY)   ____________________________________________  EKG   EKG Interpretation  Date/Time:  Wednesday March 16 2020 00:21:37 EST Ventricular Rate:  81 PR Interval:    QRS  Duration: 95 QT Interval:  361 QTC Calculation: 419 R Axis:   12 Text Interpretation: Sinus rhythm Anterior infarct, old No STEMI Confirmed by Alona Bene [phone]) on 03/16/2020 1:11:38 AM       ____________________________________________  RADIOLOGY  DG Ankle 2 Views Left  Result Date: 03/16/2020 CLINICAL DATA:  Bilateral ankle pain EXAM: LEFT ANKLE - 2 VIEW COMPARISON:  None. FINDINGS: There is no evidence of fracture, dislocation, or joint effusion. Ankle mortise is congruent. Minimal degenerative spurring within the medial gutter. Mild dorsal hypertrophy at the talonavicular joint. Small bidirectional calcaneal enthesophytes. No focal soft tissue swelling. IMPRESSION: Mild degenerative changes of the left ankle/midfoot. Electronically Signed   By: Duanne Guess D.O.   On: 03/16/2020 10:52   DG Ankle 2 Views Right  Result Date: 03/16/2020 CLINICAL DATA:  Right ankle pain EXAM: RIGHT ANKLE - 2 VIEW COMPARISON:  None. FINDINGS: There is no evidence of fracture, dislocation, or joint effusion. Mild anterior tibiotalar osteophytosis. Mild degenerative spurring within the medial and lateral gutters. Soft tissues are unremarkable. IMPRESSION: Mild degenerative changes of the right ankle without acute findings. Electronically Signed   By: Duanne Guess D.O.   On: 03/16/2020 10:52   CT Angio Chest PE W and/or Wo Contrast  Result Date: 03/16/2020 CLINICAL DATA:  Severe joint pain from neck down to the feet. Negative COVID test. EXAM: CT ANGIOGRAPHY CHEST WITH CONTRAST TECHNIQUE: Multidetector CT imaging of the chest was performed using the standard protocol during bolus administration of intravenous contrast. Multiplanar CT image reconstructions and MIPs were obtained to evaluate the vascular anatomy. CONTRAST:  OMNIPAQUE IOHEXOL 350 MG/ML SOLN COMPARISON:  None. FINDINGS: Cardiovascular: Examination is limited by respiratory motion artifact. Within that limitation, there is no  central pulmonary embolus. The size of the main pulmonary artery is normal. Heart size is normal, with no pericardial effusion. The course and caliber of the aorta are normal. There is no atherosclerotic calcification. Opacification decreased due to pulmonary arterial phase contrast bolus timing. Mediastinum/Nodes: -- No mediastinal lymphadenopathy. -- No hilar lymphadenopathy. -- No axillary lymphadenopathy. -- No supraclavicular lymphadenopathy. --there is a left-sided thyroid nodule measuring 1 cm. No followup recommended (ref: J Am Coll Radiol. 2015 Feb;12(2): 143-50). -  Unremarkable esophagus. Lungs/Pleura: Airways are patent. No pleural effusion, lobar consolidation, pneumothorax or pulmonary infarction. Upper Abdomen: Contrast bolus timing is not optimized for evaluation of the abdominal organs. The  visualized portions of the organs of the upper abdomen are normal. Musculoskeletal: No chest wall abnormality. No bony spinal canal stenosis. Review of the MIP images confirms the above findings. IMPRESSION: 1. Examination is limited by respiratory motion artifact. Within that limitation, there is no central pulmonary embolus. 2. No acute cardiopulmonary process. Electronically Signed   By: Katherine Mantle M.D.   On: 03/16/2020 03:46   MR THORACIC SPINE WO CONTRAST  Result Date: 03/16/2020 CLINICAL DATA:  49 year old female with mid back pain. Generalized body aches. EXAM: MRI THORACIC SPINE WITHOUT CONTRAST TECHNIQUE: Multiplanar, multisequence MR imaging of the thoracic spine was performed. No intravenous contrast was administered. COMPARISON:  CTA chest 0312 hours today. FINDINGS: Limited cervical spine imaging:  Negative. Thoracic spine segmentation:  Appears to be normal. Alignment:  Relatively normal thoracic kyphosis.  No scoliosis. Vertebrae: Background bone marrow signal is within normal limits. Normal vertebral body signal. At the left T5 transverse process there is a small area of increased T2  and STIR signal (series 18, image 12), although comparison with the CT at this level favors a benign osseous hemangioma (and on axial T2 the area does appear somewhat stippled, series 20, image 15). No other marrow edema or evidence of acute osseous abnormality. Cord: Capacious thoracic spinal canal at most levels. At levels where the canal is largest there is heterogeneous T2 and STIR signal dorsal to the cord (series 18, image 8), but this appears to be CSF pulsation artifact. Axial GRE images do not suggest any space-occupying mass or abnormal vessels within the thecal sac. No thoracic spinal cord signal abnormality identified. Conus medullaris appears grossly normal at T11-T12. Paraspinal and other soft tissues: Negative visible abdominal viscera; there is some bowel in some of the visible left renal fossa although a slightly low lying left kidney is suspected. STIR images suggest there is abnormal increased STIR signal in the bilateral thoracic paraspinal musculature (series 18, image 1 on the right, image 15 on the left). This is subtle on other sequences. No discrete muscle lesion. No paraspinal fluid collection or other inflammation. Disc levels: The following thoracic degenerative changes are noted: T1-T2: Negative. T2-T3: Negative. T3-T4: Negative. T4-T5: Tiny right paracentral disc herniation (series 20, image 13) effaces the ventral CSF space but there is no stenosis. T5-T6: Mild to moderate facet or ligament flavum hypertrophy. But no stenosis. T6-T7: Mild to moderate facet or ligament flavum hypertrophy greater on the right. No stenosis. T7-T8: Negative. T8-T9: Broad-based left paracentral disc protrusion (series 20, image 26) effaces the ventral CSF space but there is no significant stenosis. T9-T10: Mild to moderate facet and/or ligament flavum hypertrophy with mild to moderate bilateral T9 foraminal stenosis greater on the left. T10-T11: Mild to moderate facet hypertrophy and T10 foraminal stenosis  which is fairly symmetric. T11-T12: Mild circumferential disc bulge. Mild to moderate posterior element hypertrophy. Mild right T11 foraminal stenosis. T12-L1: Negative. IMPRESSION: 1. 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 Other paraspinal soft tissues are normal. No convincing acute osseous abnormality (benign left T5 transverse process bone hemangioma suspected). 2. Thoracic spinal cord appears normal. Heterogeneous signal in the capacious dorsal spinal canal is felt due to CSF pulsation artifact. 3. Occasional small thoracic disc herniations but no spinal stenosis. Up to moderate thoracic posterior element degeneration with moderate bilateral T9 and T10 neural foraminal stenosis. Electronically Signed   By: Odessa Fleming M.D.   On: 03/16/2020 12:13   ECHOCARDIOGRAM COMPLETE  Result Date:  03/16/2020    ECHOCARDIOGRAM REPORT   Patient Name:   ELIYA BUBAR Date of Exam: 03/16/2020 Medical Rec #:  [id number]     Height:       74.0 in Accession #:    [id number]    Weight:       220.0 lb Date of Birth:  11-Nov-1970      BSA:          2.263 m Patient Age:    49 years      BP:           139/84 mmHg Patient Gender: F             HR:           82 bpm. Exam Location:  Inpatient Procedure: 2D Echo, Cardiac Doppler and Color Doppler Indications:    Dyspnea 786.0/R06.00  History:        Patient has no prior history of Echocardiogram examinations.                 Risk Factors:Hypertension and Diabetes. COVID-19.  Sonographer:    Elmarie Shiley Dance Referring Phys: [id number] ARSHAD N KAKRAKANDY IMPRESSIONS  1. Left ventricular ejection fraction, by estimation, is 60 to 65%. The left ventricle has normal function. The left ventricle has no regional wall motion abnormalities. Left ventricular diastolic parameters were normal.  2. Right ventricular systolic function is normal. The right ventricular size is normal. Tricuspid regurgitation signal is inadequate for  assessing PA pressure.  3. A small pericardial effusion is present.  4. The mitral valve is normal in structure. Trivial mitral valve regurgitation. No evidence of mitral stenosis.  5. The aortic valve is tricuspid. Aortic valve regurgitation is trivial. No aortic stenosis is present.  6. The inferior vena cava is normal in size with greater than 50% respiratory variability, suggesting right atrial pressure of 3 mmHg. Comparison(s): No prior Echocardiogram. Conclusion(s)/Recommendation(s): Otherwise normal echocardiogram, with minor abnormalities described in the report. FINDINGS  Left Ventricle: Left ventricular ejection fraction, by estimation, is 60 to 65%. The left ventricle has normal function. The left ventricle has no regional wall motion abnormalities. The left ventricular internal cavity size was normal in size. There is  no left ventricular hypertrophy. Left ventricular diastolic parameters were normal. Right Ventricle: The right ventricular size is normal. No increase in right ventricular wall thickness. Right ventricular systolic function is normal. Tricuspid regurgitation signal is inadequate for assessing PA pressure. Left Atrium: Left atrial size was normal in size. Right Atrium: Right atrial size was normal in size. Pericardium: A small pericardial effusion is present. Mitral Valve: The mitral valve is normal in structure. Trivial mitral valve regurgitation. No evidence of mitral valve stenosis. Tricuspid Valve: The tricuspid valve is normal in structure. Tricuspid valve regurgitation is trivial. No evidence of tricuspid stenosis. Aortic Valve: The aortic valve is tricuspid. Aortic valve regurgitation is trivial. Aortic regurgitation PHT measures 765 msec. No aortic stenosis is present. Pulmonic Valve: The pulmonic valve was grossly normal. Pulmonic valve regurgitation is not visualized. No evidence of pulmonic stenosis. Aorta: The aortic root, ascending aorta, aortic arch and descending aorta are  all structurally normal, with no evidence of dilitation or obstruction. Venous: The inferior vena cava is normal in size with greater than 50% respiratory variability, suggesting right atrial pressure of 3 mmHg. IAS/Shunts: No atrial level shunt detected by color flow Doppler.  LEFT VENTRICLE PLAX 2D LVIDd:         4.90 cm  Diastology LVIDs:  3.30 cm  LV e' medial:    9.14 cm/s LV PW:         0.90 cm  LV E/e' medial:  8.4 LV IVS:        1.10 cm  LV e' lateral:   10.80 cm/s LVOT diam:     1.80 cm  LV E/e' lateral: 7.1 LV SV:         66 LV SV Index:   29 LVOT Area:     2.54 cm  RIGHT VENTRICLE RV Basal diam:  2.60 cm RV S prime:     14.50 cm/s TAPSE (M-mode): 2.6 cm LEFT ATRIUM             Index       RIGHT ATRIUM           Index LA diam:        4.40 cm 1.94 cm/m  RA Area:     13.70 cm LA Vol (A2C):   69.4 ml 30.66 ml/m RA Volume:   29.50 ml  13.03 ml/m LA Vol (A4C):   42.9 ml 18.95 ml/m LA Biplane Vol: 55.7 ml 24.61 ml/m  AORTIC VALVE LVOT Vmax:   134.00 cm/s LVOT Vmean:  89.300 cm/s LVOT VTI:    0.259 m AI PHT:      765 msec  AORTA Ao Root diam: 3.30 cm Ao Asc diam:  3.20 cm MITRAL VALVE MV Area (PHT): 3.03 cm    SHUNTS MV Decel Time: 250 msec    Systemic VTI:  0.26 m MV E velocity: 77.10 cm/s  Systemic Diam: 1.80 cm MV A velocity: 79.30 cm/s MV E/A ratio:  0.97 Jodelle Red MD Electronically signed by Jodelle Red MD Signature Date/Time: 03/16/2020/6:24:47 PM    Final    VAS [place] LOWER EXTREMITY VENOUS (DVT)  Result Date: 03/16/2020  Lower Venous DVT Study Indications: Edema.  Risk Factors: None identified. Limitations: Poor ultrasound/tissue interface. Comparison Study: No prior studies. Performing Technologist: Chanda Busing RVT  Examination Guidelines: A complete evaluation includes B-mode imaging, spectral Doppler, color Doppler, and power Doppler as needed of all accessible portions of each vessel. Bilateral testing is considered an integral part of a complete examination.  Limited examinations for reoccurring indications may be performed as noted. The reflux portion of the exam is performed with the patient in reverse Trendelenburg.  +---------+---------------+---------+-----------+----------+--------------+ RIGHT    CompressibilityPhasicitySpontaneityPropertiesThrombus Aging +---------+---------------+---------+-----------+----------+--------------+ CFV      Full           Yes      Yes                                 +---------+---------------+---------+-----------+----------+--------------+ SFJ      Full                                                        +---------+---------------+---------+-----------+----------+--------------+ FV Prox  Full                                                        +---------+---------------+---------+-----------+----------+--------------+ FV Mid   Full                                                        +---------+---------------+---------+-----------+----------+--------------+  FV DistalFull                                                        +---------+---------------+---------+-----------+----------+--------------+ PFV      Full                                                        +---------+---------------+---------+-----------+----------+--------------+ POP      Full           Yes      Yes                                 +---------+---------------+---------+-----------+----------+--------------+ PTV      Full                                                        +---------+---------------+---------+-----------+----------+--------------+ PERO     Full                                                        +---------+---------------+---------+-----------+----------+--------------+   +---------+---------------+---------+-----------+----------+--------------+ LEFT     CompressibilityPhasicitySpontaneityPropertiesThrombus Aging  +---------+---------------+---------+-----------+----------+--------------+ CFV      Full           Yes      Yes                                 +---------+---------------+---------+-----------+----------+--------------+ SFJ      Full                                                        +---------+---------------+---------+-----------+----------+--------------+ FV Prox  Full                                                        +---------+---------------+---------+-----------+----------+--------------+ FV Mid   Full                                                        +---------+---------------+---------+-----------+----------+--------------+ FV DistalFull                                                        +---------+---------------+---------+-----------+----------+--------------+   PFV      Full                                                        +---------+---------------+---------+-----------+----------+--------------+ POP      Full           Yes      Yes                                 +---------+---------------+---------+-----------+----------+--------------+ PTV      Full                                                        +---------+---------------+---------+-----------+----------+--------------+ PERO     Full                                                        +---------+---------------+---------+-----------+----------+--------------+     Summary: RIGHT: - There is no evidence of deep vein thrombosis in the lower extremity.  - No cystic structure found in the popliteal fossa.  LEFT: - There is no evidence of deep vein thrombosis in the lower extremity.  - No cystic structure found in the popliteal fossa.  *See table(s) above for measurements and observations. Electronically signed by Sherald Hess MD on 03/16/2020 at 3:01:33 PM.    Final     ____________________________________________   PROCEDURES  Procedure(s)  performed:   Procedures  None  ____________________________________________   INITIAL IMPRESSION / ASSESSMENT AND PLAN / ED COURSE  Pertinent labs & imaging results that were available during my care of the patient were reviewed by me and considered in my medical decision making (see chart for details).   Patient presents emergency department with acute onset shortness of breath with some pain between the shoulder blades.  She is experiencing continued chronic joint pain which began around the time that she developed COVID-19 in 2020.  I do not appreciate anything on exam to suspect a septic joint.  She is overall well-appearing but does have significant discomfort with any movement.  The rash in the posterior thighs appears more fungal and lower suspicion clinically for bacterial cellulitis.  No hypoxemia or tachycardia.  Given her acute onset shortness of breath along with some posterior chest pain I do plan for troponins along with D-dimer.  01:10 AM  D-dimer is significantly elevated here.  Plan for CTA of the chest. CK is also 1200. No AKI.   Discussed patient's case with TRH to request admission. Patient and family (if present) updated with plan. Care transferred to [facility] service.  I reviewed all nursing notes, vitals, pertinent old records, EKGs, labs, imaging (as available).  ____________________________________________  FINAL CLINICAL IMPRESSION(S) / ED DIAGNOSES  Final diagnoses:  Cellulitis of left lower extremity  Polyarthralgia     MEDICATIONS GIVEN DURING THIS VISIT:  Medications  lactated ringers infusion ( Intravenous New Bag/Given 03/16/20 1725)  acetaminophen (TYLENOL) tablet 650 mg (has no administration  in time range)    Or  acetaminophen (TYLENOL) suppository 650 mg (has no administration in time range)  fentaNYL (SUBLIMAZE) injection 12.5 mcg (12.5 mcg Intravenous Given 03/16/20 2057)  insulin aspart (novoLOG) injection 0-9 Units (3 Units Subcutaneous Given  03/16/20 1752)  nystatin (MYCOSTATIN/NYSTOP) topical powder ( Topical Given 03/16/20 2058)  senna-docusate (Senokot-S) tablet 1 tablet (1 tablet Oral Given 03/16/20 2058)  ketorolac (TORADOL) 30 MG/ML injection 30 mg (30 mg Intravenous Given 03/16/20 0016)  sodium chloride (PF) 0.9 % injection (  Given 03/16/20 0316)  iohexol (OMNIPAQUE) 350 MG/ML injection 100 mL (100 mLs Intravenous Contrast Given 03/16/20 0301)  doxycycline (VIBRAMYCIN) 100 mg in sodium chloride 0.9 % 250 mL IVPB ( Intravenous Rate/Dose Verify 03/16/20 0600)     Note:  This document was prepared using Dragon voice recognition software and may include unintentional dictation errors.  Alona Bene, MD, [facility] Emergency Medicine    Irini Leet, Arlyss Repress, MD 03/17/20 [phone]

## 2020-03-16 NOTE — ED Notes (Signed)
Patient transported to CT 

## 2020-03-16 NOTE — TOC Initial Note (Signed)
Transition of Care [facility]) - Initial/Assessment Note    Patient Details  Name: Kaitlin Holloway MRN: [id number] Date of Birth: 03-26-1971  Transition of [facility]) CM/SW Contact:    Ida Rogue, LCSW Phone Number: 03/16/2020, 4:23 PM  Clinical Narrative:  CSW responding to MD consult for needs.  I did some research and saw she had already been screened by our system for MCD and found ineligible.  Secured an appointment at [facility], and asked that MD send her scripts there as well so they can help her with affordability of medications. TOC will continue to follow during the course of hospitalization.                  Expected Discharge Plan: Home/Self Care Barriers to Discharge: No Barriers Identified   Patient Goals and CMS Choice        Expected Discharge Plan and Services Expected Discharge Plan: Home/Self Care                                              Prior Living Arrangements/Services                       Activities of Daily Living Home Assistive Devices/Equipment: Other (Comment) (reading glasses) ADL Screening (condition at time of admission) Patient's cognitive ability adequate to safely complete daily activities?: No Is the patient deaf or have difficulty hearing?: No Does the patient have difficulty seeing, even when wearing glasses/contacts?: No Does the patient have difficulty concentrating, remembering, or making decisions?: No Patient able to express need for assistance with ADLs?: Yes Does the patient have difficulty dressing or bathing?: Yes Independently performs ADLs?: Yes (appropriate for developmental age) Does the patient have difficulty walking or climbing stairs?: Yes Weakness of Legs: Both Weakness of Arms/Hands: Both  Permission Sought/Granted                  Emotional Assessment              Admission diagnosis:  Polyarthralgia [M25.50] Dyspnea [R06.00] Cellulitis of left lower  extremity [L03.116] Patient Active Problem List   Diagnosis Date Noted  . Dyspnea 03/16/2020  . Polyarthralgia 03/16/2020  . Hyperglycemia 03/16/2020  . Elevated CK 03/16/2020  . 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 Thyromegaly 09/09/2018  . Prediabetes   . Hypertension 04/23/2000   PCP:  Julieanne Manson, MD Pharmacy:   [facility] [address] ([place]), [place] - [address] [place] ([place]) [place] [address] Phone: [phone] Fax: [phone]     Social Determinants of Health (SDOH) Interventions    Readmission Risk Interventions No flowsheet data found.

## 2020-03-16 NOTE — Progress Notes (Signed)
Chaplain responded to spiritual consult for Advanced Directives.  Patient was alert and received information with clarity. Chaplain planned to revisit patient after huddle to answer questions after patient had read and digested the forms to answer any questions. Patient was not in room when chaplain returned. Will continue to follow. Patient says she "loves Jesus" and would welcome spiritual care.  Rev. Lynnell Chad Pager [phone]

## 2020-03-16 NOTE — Plan of Care (Signed)

## 2020-03-17 DIAGNOSIS — M609 Myositis, unspecified: Secondary | ICD-10-CM | POA: Diagnosis present

## 2020-03-17 DIAGNOSIS — D721 Eosinophilia, unspecified: Secondary | ICD-10-CM

## 2020-03-17 DIAGNOSIS — R768 Other specified abnormal immunological findings in serum: Secondary | ICD-10-CM

## 2020-03-17 DIAGNOSIS — D72829 Elevated white blood cell count, unspecified: Secondary | ICD-10-CM

## 2020-03-17 DIAGNOSIS — E119 Type 2 diabetes mellitus without complications: Secondary | ICD-10-CM

## 2020-03-17 LAB — BASIC METABOLIC PANEL
Anion gap: 7 (ref 5–15)
BUN: 14 mg/dL (ref 6–20)
CO2: 26 mmol/L (ref 22–32)
Calcium: 8.3 mg/dL — ABNORMAL LOW (ref 8.9–10.3)
Chloride: 100 mmol/L (ref 98–111)
Creatinine, Ser: 0.73 mg/dL (ref 0.44–1.00)
GFR, Estimated: >60 mL/min (ref >60)
Glucose, Bld: 282 mg/dL — ABNORMAL HIGH (ref 70–99)
Potassium: 3.9 mmol/L (ref 3.5–5.1)
Sodium: 133 mmol/L — ABNORMAL LOW (ref 135–145)

## 2020-03-17 LAB — CBC WITH DIFFERENTIAL/PLATELET
Abs Immature Granulocytes: 0.02 10*3/uL (ref 0.00–0.07)
Basophils Absolute: 0.1 10*3/uL (ref 0.0–0.1)
Basophils Relative: 0 %
Eosinophils Absolute: 12.9 10*3/uL — ABNORMAL HIGH (ref 0.0–0.5)
Eosinophils Relative: 72 %
HCT: 37.1 % (ref 36.0–46.0)
Hemoglobin: 13.1 g/dL (ref 12.0–15.0)
Immature Granulocytes: 0 %
Lymphocytes Relative: 9 %
Lymphs Abs: 1.7 10*3/uL (ref 0.7–4.0)
MCH: 30.6 pg (ref 26.0–34.0)
MCHC: 35.3 g/dL (ref 30.0–36.0)
MCV: 86.7 fL (ref 80.0–100.0)
Monocytes Absolute: 0.4 10*3/uL (ref 0.1–1.0)
Monocytes Relative: 2 %
Neutro Abs: 3.1 10*3/uL (ref 1.7–7.7)
Neutrophils Relative %: 17 %
Platelets: 208 10*3/uL (ref 150–400)
RBC: 4.28 MIL/uL (ref 3.87–5.11)
RDW: 13 % (ref 11.5–15.5)
WBC: 18.2 10*3/uL — ABNORMAL HIGH (ref 4.0–10.5)
nRBC: 0 % (ref 0.0–0.2)

## 2020-03-17 LAB — ANTI-JO 1 ANTIBODY, IGG: Anti JO-1: <0.2 AI (ref 0.0–0.9)

## 2020-03-17 LAB — GLUCOSE, CAPILLARY
Glucose-Capillary: 238 mg/dL — ABNORMAL HIGH (ref 70–99)
Glucose-Capillary: 209 mg/dL — ABNORMAL HIGH (ref 70–99)
Glucose-Capillary: 216 mg/dL — ABNORMAL HIGH (ref 70–99)
Glucose-Capillary: 198 mg/dL — ABNORMAL HIGH (ref 70–99)

## 2020-03-17 LAB — ANA: Anti Nuclear Antibody (ANA): NEGATIVE

## 2020-03-17 LAB — MAGNESIUM: Magnesium: 1.8 mg/dL (ref 1.7–2.4)

## 2020-03-17 LAB — RHEUMATOID FACTOR: Rheumatoid fact SerPl-aCnc: >650 IU/mL — ABNORMAL HIGH (ref <14.0)

## 2020-03-17 LAB — CYCLIC CITRUL PEPTIDE ANTIBODY, IGG/IGA: CCP Antibodies IgG/IgA: 176 units — ABNORMAL HIGH (ref 0–19)

## 2020-03-17 MED ORDER — ENOXAPARIN SODIUM 40 MG/0.4ML ~~LOC~~ SOLN
40.0000 mg | SUBCUTANEOUS | Status: DC
  Administered 2020-03-17 – 2020-03-20 (×4): 40 mg via SUBCUTANEOUS
  Filled 2020-03-17 (×4): qty 0.4

## 2020-03-17 MED ORDER — LOSARTAN POTASSIUM 50 MG PO TABS
25.0000 mg | ORAL_TABLET | Freq: Every day | ORAL | Status: DC
  Administered 2020-03-17 – 2020-03-31 (×14): 25 mg via ORAL
  Filled 2020-03-17 (×15): qty 1

## 2020-03-17 NOTE — Evaluation (Addendum)
Physical Therapy Evaluation Patient Details Name: Kaitlin Holloway MRN: [id number] DOB: 1970/11/08 Today's Date: 03/17/2020   History of Present Illness  49 y.o. female with PMH of COVID 19, DM, HTN, and chronic polyarthralgia presents to the emergency department with continued joint pain but new onset difficulty breathing. Pt stated joint pain started after likely covid infection in March 2020.  Clinical Impression  Pt admitted with above diagnosis. Pt ambulated 160' without an assistive device, no loss of balance. Pt reports pain and weakness in shoulders, elbows, wrists, knees and ankles since a likely covid infection in March 2020. Instructed pt in gripping towel roll to work on grip strength, as this is a deficit she especially wants to address.  Pt currently with functional limitations due to the deficits listed below (see PT Problem List). Pt will benefit from skilled PT to increase their independence and safety with mobility to allow discharge to the venue listed below.       Follow Up Recommendations Outpatient PT    Equipment Recommendations  None recommended by PT    Recommendations for Other Services   OT (order placed)    Precautions / Restrictions Precautions Precautions: None Precaution Comments: pt denies falls in past 1 year Restrictions Weight Bearing Restrictions: No      Mobility  Bed Mobility Overal bed mobility: Modified Independent             General bed mobility comments: HOB up, used rail, increased time    Transfers Overall transfer level: Independent Equipment used: None                Ambulation/Gait Ambulation/Gait assistance: Supervision Gait Distance (Feet): 160 Feet Assistive device: None Gait Pattern/deviations: Step-through pattern;Decreased stride length Gait velocity: decr   General Gait Details: steady, no loss of balance  Stairs            Wheelchair Mobility    Modified Rankin (Stroke Patients Only)        Balance Overall balance assessment: Mild deficits observed, not formally tested                                           Pertinent Vitals/Pain Pain Assessment: 0-10 Pain Score: 6  Pain Location: shoulders, elbows, wrists, knees, ankles Pain Descriptors / Indicators: Grimacing Pain Intervention(s): Limited activity within patient's tolerance;Monitored during session;Premedicated before session    Home Living Family/patient expects to be discharged to:: Private residence Living Arrangements: Alone     Home Access: Stairs to enter Entrance Stairs-Rails: None Entrance Stairs-Number of Steps: 3 Home Layout: One level Home Equipment: Toilet riser      Prior Function Level of Independence: Independent         Comments: works as Programme researcher, broadcasting/film/video; 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 Dominance        Extremity/Trunk Assessment   Upper Extremity Assessment Upper Extremity Assessment: Defer to OT evaluation    Lower Extremity Assessment Lower Extremity Assessment: RLE deficits/detail;LLE deficits/detail;Overall [facility] for tasks assessed (+4/5 ankle DF and knee ext; decreased sensation to light touch B feet (new since covid in March 2020)) RLE Deficits / Details: edema noted B ankles RLE Sensation: history of peripheral neuropathy LLE Sensation: history of peripheral neuropathy    Cervical / Trunk Assessment Cervical / Trunk Assessment: Normal  Communication   Communication: No difficulties  Cognition Arousal/Alertness: Awake/alert Behavior  During Therapy: WFL for tasks assessed/performed Overall Cognitive Status: Within Functional Limits for tasks assessed                                        General Comments      Exercises     Assessment/Plan    PT Assessment Patient needs continued PT services  PT Problem List Decreased mobility;Decreased strength;Decreased activity tolerance;Pain       PT Treatment Interventions  Gait training;Therapeutic activities;Therapeutic exercise;Patient/family education    PT Goals (Current goals can be found in the Care Plan section)  Acute Rehab PT Goals Patient Stated Goal: decrease pain, get stronger, get back to yoga and dancing PT Goal Formulation: With patient Time For Goal Achievement: 03/31/20 Potential to Achieve Goals: Good    Frequency Min 3X/week   Barriers to discharge        Co-evaluation               AM-PAC PT "6 Clicks" Mobility  Outcome Measure Help needed turning from your back to your side while in a flat bed without using bedrails?: None Help needed moving from lying on your back to sitting on the side of a flat bed without using bedrails?: A Little Help needed moving to and from a bed to a chair (including a wheelchair)?: None Help needed standing up from a chair using your arms (e.g., wheelchair or bedside chair)?: None Help needed to walk in hospital room?: None Help needed climbing 3-5 steps with a railing? : None 6 Click Score: 23    End of Session   Activity Tolerance: Patient tolerated treatment well Patient left: in chair;with call bell/phone within reach;with chair alarm set Nurse Communication: Mobility status PT Visit Diagnosis: Difficulty in walking, not elsewhere classified (R26.2);Pain Pain - Right/Left:  (both) Pain - part of body: Shoulder;Hand;Knee;Ankle and joints of foot    Time: 1127-1150 PT Time Calculation (min) (ACUTE ONLY): 23 min   Charges:   PT Evaluation $PT Eval Low Complexity: 1 Low PT Treatments $Gait Training: 8-22 mins       Ralene Bathe Kistler PT 03/17/2020  Acute Rehabilitation Services Pager [phone] Office [phone]

## 2020-03-17 NOTE — Progress Notes (Signed)
Occupational Therapy Evaluation  Patient lives alone in [place] [facility], reports since self diagnosed COVID in 2020 has had ongoing weakness impacting independence with self care/IADLs. Patient with generalized UE weakness especially B grip. Instructed patient in theraputty exercises, provided foam handles for utensils and AE kit as patient having increased difficulty with LB ADLs. Patient return demo use of reacher + sock aid with min cues. Patient is highly motivated and very appreciative for all education "I want to be independent again." Recommend continued acute OT services in order to maximize patient safety and independence with self care in order to facilitate D/C to venue listed below.    03/17/20 1436  OT Visit Information  Last OT Received On 03/17/20  Assistance Needed +1  History of Present Illness 49 y.o. female with PMH of COVID 19, DM, HTN, and chronic polyarthralgia presents to the emergency department with continued joint pain but new onset difficulty breathing. Pt stated joint pain started after likely covid infection in March 2020.  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) No difficulties  Pain Assessment  Pain Assessment No/denies pain  Cognition  Arousal/Alertness Awake/alert  Behavior During Therapy WFL for tasks assessed/performed  Overall Cognitive Status  Within Functional Limits for tasks assessed  Upper Extremity Assessment  Upper Extremity Assessment RUE deficits/detail;LUE deficits/detail  RUE Deficits / Details AROM WFL, grip 3-/5  LUE Deficits / Details AROM WFL, grip 3-/5  Lower Extremity Assessment  Lower Extremity Assessment Defer to PT evaluation  Cervical / Trunk Assessment  Cervical / Trunk Assessment Normal  ADL  Overall ADL's  Needs assistance/impaired  Grooming Set up;Sitting  Upper Body Bathing Set up;Sitting  Lower Body Bathing Moderate assistance;Sitting/lateral leans;Sit to/from stand  Lower Body Bathing Details (indicate cue type and reason) increased difficulty reaching to lower legs, provided patient with LH sponge for LEs and back  Upper Body Dressing  Set up;Sitting  Lower Body Dressing Maximal assistance;Sitting/lateral leans;Sit to/from stand  Lower Body Dressing Details (indicate cue type and reason) patient reports increased difficulty with LB dressing especially socks "they have been doing it." Patient instructed in use of reacher, sock aid and shoe horn. Patient return demo use of reacher + sock aid to don/doff socks with min cues for technique   Toilet Transfer Details (indicate cue type and reason) did not assess, patient reports just back to bed after using bathroom  General ADL Comments patient requiring increased assistance with self care due to weakness, decreased activity tolerance. Patient very appreciative of ADL AE, theraputty and utensil grips provided, highly motivated to regain independence  Vision- History  Baseline Vision/History Cataracts  Patient Visual Report  (Recent cataract sx R eye, needs for L)  Bed Mobility  Overal bed mobility Modified Independent  General bed mobility comments HOB up, used rail, increased time  Transfers  General transfer comment did not assess this session, recently back to bed  Balance  Overall  balance assessment Needs assistance  Sitting-balance support Feet  supported  Sitting balance-Leahy Scale Good  Exercises  Exercises General Lower Extremity;Other exercises  General Exercises - Lower Extremity  Ankle Circles/Pumps Both;10 reps;Supine  Quad Sets Strengthening;10 reps;Supine  Gluteal Sets Strengthening;10 reps;Supine  Other Exercises  Other Exercises provided theraputty, instruct patient in rolling putty and alternating pinching with digits, and finger spread  OT - End of Session  Activity Tolerance Patient tolerated treatment well  Patient left in bed;with call bell/phone within reach  OT Assessment  OT Recommendation/Assessment Patient needs continued OT Services  OT Visit Diagnosis Muscle weakness (generalized) (M62.81)  OT Problem List Decreased strength;Decreased knowledge of use of DME or AE;Decreased activity tolerance;Impaired UE functional use  OT Plan  OT Frequency (ACUTE ONLY) Min 3X/week  OT Treatment/Interventions (ACUTE ONLY) Self-care/ADL training;Therapeutic exercise;DME and/or AE instruction;Therapeutic activities;Patient/family education;Balance training  AM-PAC OT "6 Clicks" Daily Activity Outcome Measure (Version 2)  Help from another person eating meals? 3  Help from another person taking care of personal grooming? 3  Help from another person toileting, which includes using toliet, bedpan, or urinal? 3  Help from another person bathing (including washing, rinsing, drying)? 2  Help from another person to put on and taking off regular upper body clothing? 3  Help from another person to put on and taking off regular lower body clothing? 2  6 Click Score 16  OT Recommendation  Follow Up Recommendations Outpatient OT  OT Equipment Tub/shower bench  Individuals Consulted  Consulted and Agree with Results and Recommendations Patient  Acute Rehab OT Goals  Patient Stated Goal decrease pain, get stronger, get back to yoga and dancing  OT Goal Formulation With patient  Time For Goal Achievement 03/31/20  Potential to  Achieve Goals Good  OT Time Calculation  OT Start Time (ACUTE ONLY) 1334  OT Stop Time (ACUTE ONLY) 1413  OT Time Calculation (min) 39 min  OT General Charges  $OT Visit 1 Visit  OT Evaluation  $OT Eval Low Complexity 1 Low  OT Treatments  $Self Care/Home Management  8-22 mins  $Therapeutic Exercise 8-22 mins  Written Expression  Dominant Hand Right    Kaitlin Holloway OT OT pager: [phone]

## 2020-03-17 NOTE — Progress Notes (Signed)
PROGRESS NOTE    Kaitlin Holloway  MMH:[id number] DOB: [birth year] DOA: 03/15/2020 PCP: Mack Hook, MD    Chief Complaint  Patient presents with  . Joint Pain    Brief Narrative:  Kaitlin Holloway reported she thinks she contracted Covid March 2020, she is state was in perfect health condition doing regular exercise a few times a week prior to that.  However after March 2020 she started to have diffuse joint pain muscle aches, weakness, she has been taking ibuprofen 1-3 times per day for more than a year now, she noticed she is getting slower, and weaker she cannot make a fist due to stiffness and weakness, she will report proximal muscle weakness is harder for her to raise arms , harder for her to get from sitting position to stand up position . she presented to the hospital due to significant upper back pain to the point she cannot breath  Subjective:  Report fentanyl helped with pain  Assessment & Plan:   Principal Problem:   Dyspnea Active Problems:   Polyarthralgia   Hyperglycemia   Elevated CK   Myositis   Dyspnea, reported likely due to back pain Echocardiogram with A small pericardial effusion otherwise unremarkable  CTA unremarkable, no hypoxia, denies chest pain, troponin negative  Generalized body aches with polyarthralgia bilateral ankle swelling stiffness in hands and proximal muscle weakness, she has significant point tenderness upper thoracic spine  -MRI thoracic spine" 1. 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 Other paraspinal soft tissues are normal. No convincing acute osseous abnormality (benign left T5 transverse process bone hemangioma suspected).  2. Thoracic spinal cord appears normal. Heterogeneous signal in the capacious dorsal spinal canal is felt due to CSF pulsation artifact.  3. Occasional small thoracic disc herniations but no spinal stenosis. Up to  moderate thoracic posterior element degeneration with moderate bilateral T9 and T10 neural foraminal stenosis  -Venous Doppler bilateral lower extremity no DVT -Bilateral ankle x-ray mild degenerative changes no acute findings -She has several lab abnormalities including elevated CK level at 1000 range ,significant elevated rheumatoid factor greater than 650, elevated CCP at 176, mild elevated ESR and CRP, negative ANA, negative anti-Jo 1 - will discuss with rheumatology -For now pain control and physical therapy  Skin rash bilateral groin -Trial of topical nystatin   Leukocytosis and eosinophilia -Unclear etiology ,she has no fever -Denies history of asthma  Noninsulin-dependent type 2 diabetes, uncontrolled, with hyperglycemia -A1c 9.7, down from 12.5 in 2020 -Report history of metformin intolerance -Currently on SSI -she agrees to take glipizide at discharge -She reports symptom of peripheral neuropathy and eye issues, will discuss with her about Neurontin and ophthalmology follow-up  Recent history of cataract surgery, right eye Follow-up with ophthalmology  Hypertension She does not want to take lisinopril She agrees to try losartan     DVT prophylaxis: Lovenox   Code Status: Full Family Communication: Patient Disposition:   Status is: Inpatient  Dispo: The patient is from: Home              Anticipated d/c is to: To be determined              Anticipated d/c date is: To be determined                Consultants:   We will contact rheumatology  Procedures:   None  Antimicrobials:   None     Objective: Vitals:   03/16/20 1407  03/16/20 2048 03/17/20 0400 03/17/20 1259  BP: (!) 149/86 (!) 160/97 (!) 152/97 (!) 160/91  Pulse: 88 87 84 91  Resp: $Remo'12  16 20  'kDgIk$ Temp: 98.1 F (36.7 C) 98 F (36.7 C) 98 F (36.7 C) 97.9 F (36.6 C)  TempSrc:   Oral Oral  SpO2: 99% 100% 97% 100%  Weight:      Height:        Intake/Output Summary (Last 24 hours) at  03/17/2020 1650 Last data filed at 03/17/2020 0800 Gross per 24 hour  Intake 2581.42 ml  Output 3000 ml  Net -418.58 ml   Filed Weights   03/16/20 0539  Weight: 99.8 kg    Examination:  General exam: Weak ,calm, NAD Respiratory system: Clear to auscultation. Respiratory effort normal. Cardiovascular system: S1 & S2 heard, RRR.  Gastrointestinal system: Abdomen is nondistended, soft and nontender. No organomegaly or masses felt. Normal bowel sounds heard. Central nervous system: Alert and oriented. No focal neurological deficits. Extremities: Generalized weakness, not able to make a fist.  Tenderness PIP joint both hand, MCP joint enlargement, bilateral ankle edema Skin: Bilateral groin raised rash Psychiatry: Anxious    Data Reviewed: I have personally reviewed following labs and imaging studies  CBC: Recent Labs  Lab 03/16/20 0004 03/16/20 0810 03/17/20 0500  WBC 19.2* 18.2* 18.2*  NEUTROABS 4.9 3.7 3.1  HGB 13.8 13.3 13.1  HCT 38.9 38.1 37.1  MCV 84.9 87.2 86.7  PLT 214 187 542    Basic Metabolic Panel: Recent Labs  Lab 03/16/20 0004 03/16/20 0810 03/17/20 0500  NA 135 134* 133*  K 4.0 3.8 3.9  CL 100 102 100  CO2 $Re'24 22 26  'ojk$ GLUCOSE 285* 237* 282*  BUN $Re'17 14 14  'zea$ CREATININE 0.73 0.70 0.73  CALCIUM 8.8* 8.3* 8.3*  MG  --   --  1.8    GFR: Estimated Creatinine Clearance: 114.9 mL/min (by C-G formula based on SCr of 0.73 mg/dL).  Liver Function Tests: Recent Labs  Lab 03/16/20 0004 03/16/20 0810  AST 38 29  ALT 44 34  ALKPHOS 107 82  BILITOT 0.9 0.7  PROT 6.6 5.6*  ALBUMIN 3.1* 2.8*    CBG: Recent Labs  Lab 03/16/20 1724 03/16/20 2248 03/17/20 0717 03/17/20 1151 03/17/20 1624  GLUCAP 237* 232* 238* 209* 216*     Recent Results (from the past 240 hour(s))  Resp Panel by RT-PCR (Flu A&B, Covid) Nasopharyngeal Swab     Status: None   Collection Time: 03/16/20 12:04 AM   Specimen: Nasopharyngeal Swab; Nasopharyngeal(NP) swabs in vial  transport medium  Result Value Ref Range Status   SARS Coronavirus 2 by RT PCR NEGATIVE NEGATIVE Final    Comment: (NOTE) SARS-CoV-2 target nucleic acids are NOT DETECTED.  The SARS-CoV-2 RNA is generally detectable in upper respiratory specimens during the acute phase of infection. The lowest concentration of SARS-CoV-2 viral copies this assay can detect is 138 copies/mL. A negative result does not preclude SARS-Cov-2 infection and should not be used as the sole basis for treatment or other patient management decisions. A negative result may occur with  improper specimen collection/handling, submission of specimen other than nasopharyngeal swab, presence of viral mutation(s) within the areas targeted by this assay, and inadequate number of viral copies(<138 copies/mL). A negative result must be combined with clinical observations, patient history, and epidemiological information. The expected result is Negative.  Fact Sheet for Patients:  EntrepreneurPulse.com.au  Fact Sheet for Healthcare Providers:  IncredibleEmployment.be  This test is  no t yet approved or cleared by the [place] and  has been authorized for detection and/or diagnosis of SARS-CoV-2 by FDA under an Emergency Use Authorization (EUA). This EUA will remain  in effect (meaning this test can be used) for the duration of the COVID-19 declaration under Section 564(b)(1) of the Act, 21 U.S.C.section 360bbb-3(b)(1), unless the authorization is terminated  or revoked sooner.       Influenza A by PCR NEGATIVE NEGATIVE Final   Influenza B by PCR NEGATIVE NEGATIVE Final    Comment: (NOTE) The Xpert Xpress SARS-CoV-2/FLU/RSV plus assay is intended as an aid in the diagnosis of influenza from Nasopharyngeal swab specimens and should not be used as a sole basis for treatment. Nasal washings and aspirates are unacceptable for Xpert Xpress SARS-CoV-2/FLU/RSV testing.  Fact  Sheet for Patients: EntrepreneurPulse.com.au  Fact Sheet for Healthcare Providers: IncredibleEmployment.be  This test is not yet approved or cleared by the [place] FDA and has been authorized for detection and/or diagnosis of SARS-CoV-2 by FDA under an Emergency Use Authorization (EUA). This EUA will remain in effect (meaning this test can be used) for the duration of the COVID-19 declaration under Section 564(b)(1) of the Act, 21 U.S.C. section 360bbb-3(b)(1), unless the authorization is terminated or revoked.  Performed at [facility], [place] [address], [place], [place] [address]   Culture, blood (routine x 2)     Status: None (Preliminary result)   Collection Time: 03/16/20  7:55 AM   Specimen: BLOOD  Result Value Ref Range Status   Specimen Description   Final    BLOOD LEFT ANTECUBITAL Performed at [facility] [address] [place], [place] [address]    Special Requests   Final    BOTTLES DRAWN AEROBIC ONLY Blood Culture adequate volume Performed at [facility] [address] [place], [place] [address]    Culture   Final    NO GROWTH < 24 HOURS Performed at [facility] [address], [place], [place] [address]    Report Status PENDING  Incomplete  Culture, blood (routine x 2)     Status: None (Preliminary result)   Collection Time: 03/16/20  8:10 AM   Specimen: BLOOD  Result Value Ref Range Status   Specimen Description   Final    BLOOD LEFT ANTECUBITAL Performed at [facility] [address], [place], [place] [address]    Special Requests   Final    BOTTLES DRAWN AEROBIC ONLY Blood Culture results may not be optimal due to an inadequate volume of blood received in culture bottles Performed at [facility] [address], [place], [place] [address]    Culture   Final    NO GROWTH < 24 HOURS Performed at [facility] [address] [place], [place] [address]    Report Status PENDING  Incomplete         Radiology Studies: DG Ankle 2 Views Left  Result Date: 03/16/2020 CLINICAL DATA:  Bilateral ankle pain EXAM: LEFT ANKLE - 2 VIEW COMPARISON:  None. FINDINGS: There is no evidence of fracture, dislocation, or joint effusion. Ankle mortise is congruent. Minimal degenerative spurring within the medial gutter. Mild dorsal hypertrophy at the talonavicular joint. Small bidirectional calcaneal enthesophytes. No focal soft tissue swelling. IMPRESSION: Mild degenerative changes of the left ankle/midfoot. Electronically Signed   By: Davina Poke D.O.   On: 03/16/2020 10:52   DG Ankle 2  Views Right  Result Date: 03/16/2020 CLINICAL DATA:  Right ankle pain EXAM: RIGHT ANKLE - 2 VIEW COMPARISON:  None. FINDINGS: There is no evidence of fracture, dislocation, or joint effusion. Mild anterior tibiotalar osteophytosis. Mild degenerative spurring within the medial and lateral gutters. Soft tissues are unremarkable. IMPRESSION: Mild degenerative changes of the right ankle without acute findings. Electronically Signed   By: Davina Poke D.O.   On: 03/16/2020 10:52   CT Angio Chest PE W and/or Wo Contrast  Result Date: 03/16/2020 CLINICAL DATA:  Severe joint pain from neck down to the feet. Negative COVID test. EXAM: CT ANGIOGRAPHY CHEST WITH CONTRAST TECHNIQUE: Multidetector CT imaging of the chest was performed using the standard protocol during bolus administration of intravenous contrast. Multiplanar CT image reconstructions and MIPs were obtained to evaluate the vascular anatomy. CONTRAST:  187mL OMNIPAQUE IOHEXOL 350 MG/ML SOLN COMPARISON:  None. FINDINGS: Cardiovascular: Examination is limited by respiratory motion artifact. Within that limitation, there is no central pulmonary embolus. The size of the main pulmonary artery is normal. Heart size is normal, with no pericardial effusion. The course and  caliber of the aorta are normal. There is no atherosclerotic calcification. Opacification decreased due to pulmonary arterial phase contrast bolus timing. Mediastinum/Nodes: -- No mediastinal lymphadenopathy. -- No hilar lymphadenopathy. -- No axillary lymphadenopathy. -- No supraclavicular lymphadenopathy. --there is a left-sided thyroid nodule measuring 1 cm. No followup recommended (ref: J Am Coll Radiol. 2015 Feb;12(2): 143-50). -  Unremarkable esophagus. Lungs/Pleura: Airways are patent. No pleural effusion, lobar consolidation, pneumothorax or pulmonary infarction. Upper Abdomen: Contrast bolus timing is not optimized for evaluation of the abdominal organs. The visualized portions of the organs of the upper abdomen are normal. Musculoskeletal: No chest wall abnormality. No bony spinal canal stenosis. Review of the MIP images confirms the above findings. IMPRESSION: 1. Examination is limited by respiratory motion artifact. Within that limitation, there is no central pulmonary embolus. 2. No acute cardiopulmonary process. Electronically Signed   By: Constance Holster M.D.   On: 03/16/2020 03:46   MR THORACIC SPINE WO CONTRAST  Result Date: 03/16/2020 CLINICAL DATA:  49 year old female with mid back pain. Generalized body aches. EXAM: MRI THORACIC SPINE WITHOUT CONTRAST TECHNIQUE: Multiplanar, multisequence MR imaging of the thoracic spine was performed. No intravenous contrast was administered. COMPARISON:  CTA chest 0312 hours today. FINDINGS: Limited cervical spine imaging:  Negative. Thoracic spine segmentation:  Appears to be normal. Alignment:  Relatively normal thoracic kyphosis.  No scoliosis. Vertebrae: Background bone marrow signal is within normal limits. Normal vertebral body signal. At the left T5 transverse process there is a small area of increased T2 and STIR signal (series 18, image 12), although comparison with the CT at this level favors a benign osseous hemangioma (and on axial T2 the  area does appear somewhat stippled, series 20, image 15). No other marrow edema or evidence of acute osseous abnormality. Cord: Capacious thoracic spinal canal at most levels. At levels where the canal is largest there is heterogeneous T2 and STIR signal dorsal to the cord (series 18, image 8), but this appears to be CSF pulsation artifact. Axial GRE images do not suggest any space-occupying mass or abnormal vessels within the thecal sac. No thoracic spinal cord signal abnormality identified. Conus medullaris appears grossly normal at T11-T12. Paraspinal and other soft tissues: Negative visible abdominal viscera; there is some bowel in some of the visible left renal fossa although a slightly low lying left kidney is suspected. STIR images suggest there is abnormal  increased STIR signal in the bilateral thoracic paraspinal musculature (series 18, image 1 on the right, image 15 on the left). This is subtle on other sequences. No discrete muscle lesion. No paraspinal fluid collection or other inflammation. Disc levels: The following thoracic degenerative changes are noted: T1-T2: Negative. T2-T3: Negative. T3-T4: Negative. T4-T5: Tiny right paracentral disc herniation (series 20, image 13) effaces the ventral CSF space but there is no stenosis. T5-T6: Mild to moderate facet or ligament flavum hypertrophy. But no stenosis. T6-T7: Mild to moderate facet or ligament flavum hypertrophy greater on the right. No stenosis. T7-T8: Negative. T8-T9: Broad-based left paracentral disc protrusion (series 20, image 26) effaces the ventral CSF space but there is no significant stenosis. T9-T10: Mild to moderate facet and/or ligament flavum hypertrophy with mild to moderate bilateral T9 foraminal stenosis greater on the left. T10-T11: Mild to moderate facet hypertrophy and T10 foraminal stenosis which is fairly symmetric. T11-T12: Mild circumferential disc bulge. Mild to moderate posterior element hypertrophy. Mild right T11 foraminal  stenosis. T12-L1: Negative. IMPRESSION: 1. 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 Other paraspinal soft tissues are normal. No convincing acute osseous abnormality (benign left T5 transverse process bone hemangioma suspected). 2. Thoracic spinal cord appears normal. Heterogeneous signal in the capacious dorsal spinal canal is felt due to CSF pulsation artifact. 3. Occasional small thoracic disc herniations but no spinal stenosis. Up to moderate thoracic posterior element degeneration with moderate bilateral T9 and T10 neural foraminal stenosis. Electronically Signed   By: Odessa Fleming M.D.   On: 03/16/2020 12:13   DG Chest Portable 1 View  Result Date: 03/16/2020 CLINICAL DATA:  Shortness of breath EXAM: PORTABLE CHEST 1 VIEW COMPARISON:  None. FINDINGS: The heart size is mildly enlarged. Aortic calcifications are noted. There is no pneumothorax. No significant pleural effusion. No focal infiltrate. IMPRESSION: No active disease. Electronically Signed   By: Katherine Mantle M.D.   On: 03/16/2020 00:11   ECHOCARDIOGRAM COMPLETE  Result Date: 03/16/2020    ECHOCARDIOGRAM REPORT   Patient Name:   LATONDA LARRIVEE Date of Exam: 03/16/2020 Medical Rec #:  [id number]     Height:       74.0 in Accession #:    [id number]    Weight:       220.0 lb Date of Birth:  1971-01-25      BSA:          2.263 m Patient Age:    49 years      BP:           139/84 mmHg Patient Gender: F             HR:           82 bpm. Exam Location:  Inpatient Procedure: 2D Echo, Cardiac Doppler and Color Doppler Indications:    Dyspnea 786.0/R06.00  History:        Patient has no prior history of Echocardiogram examinations.                 Risk Factors:Hypertension and Diabetes. COVID-19.  Sonographer:    Elmarie Shiley Dance Referring Phys: [id number] ARSHAD N KAKRAKANDY IMPRESSIONS  1. Left ventricular ejection fraction, by estimation, is 60 to 65%. The left ventricle has  normal function. The left ventricle has no regional wall motion abnormalities. Left ventricular diastolic parameters were normal.  2. Right ventricular systolic function is normal. The right ventricular size is normal. Tricuspid regurgitation signal is inadequate for assessing  PA pressure.  3. A small pericardial effusion is present.  4. The mitral valve is normal in structure. Trivial mitral valve regurgitation. No evidence of mitral stenosis.  5. The aortic valve is tricuspid. Aortic valve regurgitation is trivial. No aortic stenosis is present.  6. The inferior vena cava is normal in size with greater than 50% respiratory variability, suggesting right atrial pressure of 3 mmHg. Comparison(s): No prior Echocardiogram. Conclusion(s)/Recommendation(s): Otherwise normal echocardiogram, with minor abnormalities described in the report. FINDINGS  Left Ventricle: Left ventricular ejection fraction, by estimation, is 60 to 65%. The left ventricle has normal function. The left ventricle has no regional wall motion abnormalities. The left ventricular internal cavity size was normal in size. There is  no left ventricular hypertrophy. Left ventricular diastolic parameters were normal. Right Ventricle: The right ventricular size is normal. No increase in right ventricular wall thickness. Right ventricular systolic function is normal. Tricuspid regurgitation signal is inadequate for assessing PA pressure. Left Atrium: Left atrial size was normal in size. Right Atrium: Right atrial size was normal in size. Pericardium: A small pericardial effusion is present. Mitral Valve: The mitral valve is normal in structure. Trivial mitral valve regurgitation. No evidence of mitral valve stenosis. Tricuspid Valve: The tricuspid valve is normal in structure. Tricuspid valve regurgitation is trivial. No evidence of tricuspid stenosis. Aortic Valve: The aortic valve is tricuspid. Aortic valve regurgitation is trivial. Aortic regurgitation PHT  measures 765 msec. No aortic stenosis is present. Pulmonic Valve: The pulmonic valve was grossly normal. Pulmonic valve regurgitation is not visualized. No evidence of pulmonic stenosis. Aorta: The aortic root, ascending aorta, aortic arch and descending aorta are all structurally normal, with no evidence of dilitation or obstruction. Venous: The inferior vena cava is normal in size with greater than 50% respiratory variability, suggesting right atrial pressure of 3 mmHg. IAS/Shunts: No atrial level shunt detected by color flow Doppler.  LEFT VENTRICLE PLAX 2D LVIDd:         4.90 cm  Diastology LVIDs:         3.30 cm  LV e' medial:    9.14 cm/s LV PW:         0.90 cm  LV E/e' medial:  8.4 LV IVS:        1.10 cm  LV e' lateral:   10.80 cm/s LVOT diam:     1.80 cm  LV E/e' lateral: 7.1 LV SV:         66 LV SV Index:   29 LVOT Area:     2.54 cm  RIGHT VENTRICLE RV Basal diam:  2.60 cm RV S prime:     14.50 cm/s TAPSE (M-mode): 2.6 cm LEFT ATRIUM             Index       RIGHT ATRIUM           Index LA diam:        4.40 cm 1.94 cm/m  RA Area:     13.70 cm LA Vol (A2C):   69.4 ml 30.66 ml/m RA Volume:   29.50 ml  13.03 ml/m LA Vol (A4C):   42.9 ml 18.95 ml/m LA Biplane Vol: 55.7 ml 24.61 ml/m  AORTIC VALVE LVOT Vmax:   134.00 cm/s LVOT Vmean:  89.300 cm/s LVOT VTI:    0.259 m AI PHT:      765 msec  AORTA Ao Root diam: 3.30 cm Ao Asc diam:  3.20 cm MITRAL VALVE MV Area (PHT): 3.03 cm  SHUNTS MV Decel Time: 250 msec    Systemic VTI:  0.26 m MV E velocity: 77.10 cm/s  Systemic Diam: 1.80 cm MV A velocity: 79.30 cm/s MV E/A ratio:  0.97 Buford Dresser MD Electronically signed by Buford Dresser MD Signature Date/Time: 03/16/2020/6:24:47 PM    Final    VAS [place] LOWER EXTREMITY VENOUS (DVT)  Result Date: 03/16/2020  Lower Venous DVT Study Indications: Edema.  Risk Factors: None identified. Limitations: Poor ultrasound/tissue interface. Comparison Study: No prior studies. Performing Technologist:  Oliver Hum RVT  Examination Guidelines: A complete evaluation includes B-mode imaging, spectral Doppler, color Doppler, and power Doppler as needed of all accessible portions of each vessel. Bilateral testing is considered an integral part of a complete examination. Limited examinations for reoccurring indications may be performed as noted. The reflux portion of the exam is performed with the patient in reverse Trendelenburg.  +---------+---------------+---------+-----------+----------+--------------+ RIGHT    CompressibilityPhasicitySpontaneityPropertiesThrombus Aging +---------+---------------+---------+-----------+----------+--------------+ CFV      Full           Yes      Yes                                 +---------+---------------+---------+-----------+----------+--------------+ SFJ      Full                                                        +---------+---------------+---------+-----------+----------+--------------+ FV Prox  Full                                                        +---------+---------------+---------+-----------+----------+--------------+ FV Mid   Full                                                        +---------+---------------+---------+-----------+----------+--------------+ FV DistalFull                                                        +---------+---------------+---------+-----------+----------+--------------+ PFV      Full                                                        +---------+---------------+---------+-----------+----------+--------------+ POP      Full           Yes      Yes                                 +---------+---------------+---------+-----------+----------+--------------+ PTV      Full                                                        +---------+---------------+---------+-----------+----------+--------------+  PERO     Full                                                         +---------+---------------+---------+-----------+----------+--------------+   +---------+---------------+---------+-----------+----------+--------------+ LEFT     CompressibilityPhasicitySpontaneityPropertiesThrombus Aging +---------+---------------+---------+-----------+----------+--------------+ CFV      Full           Yes      Yes                                 +---------+---------------+---------+-----------+----------+--------------+ SFJ      Full                                                        +---------+---------------+---------+-----------+----------+--------------+ FV Prox  Full                                                        +---------+---------------+---------+-----------+----------+--------------+ FV Mid   Full                                                        +---------+---------------+---------+-----------+----------+--------------+ FV DistalFull                                                        +---------+---------------+---------+-----------+----------+--------------+ PFV      Full                                                        +---------+---------------+---------+-----------+----------+--------------+ POP      Full           Yes      Yes                                 +---------+---------------+---------+-----------+----------+--------------+ PTV      Full                                                        +---------+---------------+---------+-----------+----------+--------------+ PERO     Full                                                        +---------+---------------+---------+-----------+----------+--------------+  Summary: RIGHT: - There is no evidence of deep vein thrombosis in the lower extremity.  - No cystic structure found in the popliteal fossa.  LEFT: - There is no evidence of deep vein thrombosis in the lower extremity.  - No cystic structure found in the popliteal  fossa.  *See table(s) above for measurements and observations. Electronically signed by Monica Martinez MD on 03/16/2020 at 3:01:33 PM.    Final         Scheduled Meds: . insulin aspart  0-9 Units Subcutaneous TID WC  . losartan  25 mg Oral Daily  . nystatin   Topical BID  . senna-docusate  1 tablet Oral BID   Continuous Infusions:   LOS: 0 days   Time spent: 29mins Greater than 50% of this time was spent in counseling, explanation of diagnosis, planning of further management, and coordination of care.  I have personally reviewed and interpreted on  03/17/2020 daily labs, tele strips, imagings as discussed above under date review session and assessment and plans.  I reviewed all nursing notes, pharmacy notes,  vitals, pertinent old records  I have discussed plan of care as described above with RN , patient  on 03/17/2020  Voice Recognition /Dragon dictation system was used to create this note, attempts have been made to correct errors. Please contact the author with questions and/or clarifications.   Florencia Reasons, MD PhD FACP [facility]  Available via Epic secure chat 7am-7pm for nonurgent issues Please page for urgent issues To page the attending provider between 7A-7P or the covering provider during after hours 7P-7A, please log into the web site [email or web address] and access using universal [place] password for that web site. If you do not have the password, please call the hospital operator.    03/17/2020, 4:50 PM

## 2020-03-18 LAB — CBC WITH DIFFERENTIAL/PLATELET
Abs Immature Granulocytes: 0.03 10*3/uL (ref 0.00–0.07)
Basophils Absolute: 0.1 10*3/uL (ref 0.0–0.1)
Basophils Relative: 0 %
Eosinophils Absolute: 12.2 10*3/uL — ABNORMAL HIGH (ref 0.0–0.5)
Eosinophils Relative: 68 %
HCT: 39.8 % (ref 36.0–46.0)
Hemoglobin: 13.9 g/dL (ref 12.0–15.0)
Immature Granulocytes: 0 %
Lymphocytes Relative: 10 %
Lymphs Abs: 1.9 10*3/uL (ref 0.7–4.0)
MCH: 30.3 pg (ref 26.0–34.0)
MCHC: 34.9 g/dL (ref 30.0–36.0)
MCV: 86.9 fL (ref 80.0–100.0)
Monocytes Absolute: 0.4 10*3/uL (ref 0.1–1.0)
Monocytes Relative: 2 %
Neutro Abs: 3.6 10*3/uL (ref 1.7–7.7)
Neutrophils Relative %: 20 %
Platelets: 206 10*3/uL (ref 150–400)
RBC: 4.58 MIL/uL (ref 3.87–5.11)
RDW: 13.1 % (ref 11.5–15.5)
WBC: 18.2 10*3/uL — ABNORMAL HIGH (ref 4.0–10.5)
nRBC: 0 % (ref 0.0–0.2)

## 2020-03-18 LAB — BASIC METABOLIC PANEL
Anion gap: 9 (ref 5–15)
BUN: 15 mg/dL (ref 6–20)
CO2: 23 mmol/L (ref 22–32)
Calcium: 8.7 mg/dL — ABNORMAL LOW (ref 8.9–10.3)
Chloride: 102 mmol/L (ref 98–111)
Creatinine, Ser: 0.86 mg/dL (ref 0.44–1.00)
GFR, Estimated: >60 mL/min (ref >60)
Glucose, Bld: 189 mg/dL — ABNORMAL HIGH (ref 70–99)
Potassium: 3.8 mmol/L (ref 3.5–5.1)
Sodium: 134 mmol/L — ABNORMAL LOW (ref 135–145)

## 2020-03-18 LAB — LIPID PANEL
Cholesterol: 162 mg/dL (ref 0–200)
HDL: 34 mg/dL — ABNORMAL LOW (ref >40)
LDL Cholesterol: 104 mg/dL — ABNORMAL HIGH (ref 0–99)
Total CHOL/HDL Ratio: 4.8 RATIO
Triglycerides: 122 mg/dL (ref <150)
VLDL: 24 mg/dL (ref 0–40)

## 2020-03-18 LAB — GLUCOSE, CAPILLARY
Glucose-Capillary: 184 mg/dL — ABNORMAL HIGH (ref 70–99)
Glucose-Capillary: 181 mg/dL — ABNORMAL HIGH (ref 70–99)
Glucose-Capillary: 230 mg/dL — ABNORMAL HIGH (ref 70–99)
Glucose-Capillary: 186 mg/dL — ABNORMAL HIGH (ref 70–99)

## 2020-03-18 LAB — CYTOLOGY - NON PAP

## 2020-03-18 LAB — CK: Total CK: 360 U/L — ABNORMAL HIGH (ref 38–234)

## 2020-03-18 MED ORDER — INSULIN ASPART 100 UNIT/ML ~~LOC~~ SOLN
3.0000 [IU] | Freq: Three times a day (TID) | SUBCUTANEOUS | Status: DC
  Administered 2020-03-18 – 2020-03-23 (×15): 3 [IU] via SUBCUTANEOUS

## 2020-03-18 NOTE — Progress Notes (Signed)
Inpatient Diabetes Program Recommendations  AACE/ADA: New Consensus Statement on Inpatient Glycemic Control  Target Ranges:  Prepandial:   less than 140 mg/dL      Peak postprandial:   less than 180 mg/dL (1-2 hours)      Critically ill patients:  140 - 180 mg/dL   Results for Kaitlin Holloway, Kaitlin Holloway (MRN [id number]) as of 03/18/2020 09:31  Ref. Range 03/17/2020 07:17 03/17/2020 11:51 03/17/2020 16:24 03/17/2020 20:36 03/18/2020 07:22  Glucose-Capillary Latest Ref Range: 70 - 99 mg/dL 259 (H) 563 (H) 875 (H) 198 (H) 184 (H)   Review of Glycemic Control  Diabetes history: DM2 Outpatient Diabetes medications: None Current orders for Inpatient glycemic control: Novolog 0-9 units TID with meals  Inpatient Diabetes Program Recommendations:    Insulin: Please consider ordering Novolog 3 units TID with meals for meal coverage if patient eats at least 50% of meals.  HbgA1C:  A1C 9.7% on 03/16/20 indicating an average glucose of 232 mg/dl over the past 2-3 months. At time of discharge, consider prescribing Glipizide as patient is willing to try it again as an outpatient.  Thanks, Orlando Penner, RN, MSN, CDE Diabetes Coordinator Inpatient Diabetes Program [phone] (Team Pager from 8am to 5pm)

## 2020-03-18 NOTE — Progress Notes (Signed)
PROGRESS NOTE    Kaitlin Holloway  ZOX:[id number] DOB: 04-26-1970 DOA: 03/15/2020 PCP: Kaitlin Hook, MD    Chief Complaint  Patient presents with   Joint Pain    Brief Narrative:  Kaitlin Holloway reported she thinks she contracted Covid March 2020, she is state was in perfect health condition doing regular exercise a few times a week prior to that.  However after March 2020 she started to have diffuse joint pain muscle aches, weakness, she has been taking ibuprofen 1-3 times per day for more than a year now, she noticed she is getting slower, and weaker she cannot make a fist due to stiffness and weakness, she will report proximal muscle weakness is harder for her to raise arms , harder for her to get from sitting position to stand up position . she presented to the hospital due to significant upper back pain to the point she cannot breath  Subjective:  Continue need fentanyl for  Pain Continue to be weak, difficult getting up from toilet  Assessment & Plan:   Principal Problem:   Dyspnea Active Problems:   Polyarthralgia   Hyperglycemia   Elevated CK   Myositis   Dyspnea, reported likely due to back pain Echocardiogram with A small pericardial effusion otherwise unremarkable  CTA unremarkable, no hypoxia, denies chest pain, troponin negative  Generalized body aches with polyarthralgia bilateral ankle swelling stiffness in hands and proximal muscle weakness, she has significant point tenderness upper thoracic spine  -MRI thoracic spine" 1. 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 Other paraspinal soft tissues are normal. No convincing acute osseous abnormality (benign left T5 transverse process bone hemangioma suspected).  2. Thoracic spinal cord appears normal. Heterogeneous signal in the capacious dorsal spinal canal is felt due to CSF pulsation artifact.  3. Occasional small thoracic  disc herniations but no spinal stenosis. Up to moderate thoracic posterior element degeneration with moderate bilateral T9 and T10 neural foraminal stenosis  -Venous Doppler bilateral lower extremity no DVT -Bilateral ankle x-ray mild degenerative changes no acute findings -She has several lab abnormalities including elevated CK level at 1000 range ,significant elevated rheumatoid factor greater than 650, elevated CCP at 176, mild elevated ESR and CRP, negative ANA, negative anti-Jo 1 - discussed with  [facility] who initially recommend transfer, however, [facility] is at capacity, no bed, rheumatology recommend consult neurology and hematology here while waiting for a bed, will call neurology and hematology, rheumatology recommend holding steroids until further work up  -For now pain control and physical therapy  Skin rash bilateral groin -Trial of topical nystatin   Leukocytosis and eosinophilia -Unclear etiology ,she has no fever -Denies history of asthma  Noninsulin-dependent type 2 diabetes, uncontrolled, with hyperglycemia -A1c 9.7, down from 12.5 in 2020 -Report history of metformin intolerance -Currently on SSI -she agrees to take glipizide at discharge -She reports symptom of peripheral neuropathy and eye issues, will discuss with her about Neurontin and ophthalmology follow-up  Recent history of cataract surgery, right eye Follow-up with ophthalmology  Hypertension She does not want to take lisinopril She agrees to try losartan     DVT prophylaxis: enoxaparin (LOVENOX) injection 40 mg Start: 03/17/20 1800 Place and maintain sequential compression device Start: 03/17/20 1714Lovenox   Code Status: Full Family Communication: Patient Disposition:   Status is: Inpatient  Dispo: The patient is from: Home              Anticipated d/c is to: To  be determined              Anticipated d/c date is: To be determined                Consultants:   We will  contact rheumatology  Procedures:   None  Antimicrobials:   None     Objective: Vitals:   03/18/20 0500 03/18/20 0800 03/18/20 1054 03/18/20 1151  BP: (!) 163/96  (!) 149/82 (!) 151/92  Pulse: 83  100 81  Resp: 20   18  Temp: 98.5 F (36.9 C)   98 F (36.7 C)  TempSrc: Oral     SpO2: 96% 96%  99%  Weight:      Height:        Intake/Output Summary (Last 24 hours) at 03/18/2020 1657 Last data filed at 03/18/2020 0500 Gross per 24 hour  Intake --  Output 1500 ml  Net -1500 ml   Filed Weights   03/16/20 0539  Weight: 99.8 kg    Examination:  General exam: Weak ,calm, NAD Respiratory system: Clear to auscultation. Respiratory effort normal. Cardiovascular system: S1 & S2 heard, RRR.  Gastrointestinal system: Abdomen is nondistended, soft and nontender. No organomegaly or masses felt. Normal bowel sounds heard. Central nervous system: Alert and oriented. No focal neurological deficits. Extremities: Generalized weakness, not able to make a fist.  Tenderness PIP joint both hand, MCP joint enlargement, bilateral ankle edema Skin: Bilateral groin raised rash Psychiatry: Anxious    Data Reviewed: I have personally reviewed following labs and imaging studies  CBC: Recent Labs  Lab 03/16/20 0004 03/16/20 0810 03/17/20 0500 03/18/20 0541  WBC 19.2* 18.2* 18.2* 18.2*  NEUTROABS 4.9 3.7 3.1 3.6  HGB 13.8 13.3 13.1 13.9  HCT 38.9 38.1 37.1 39.8  MCV 84.9 87.2 86.7 86.9  PLT 214 187 208 509    Basic Metabolic Panel: Recent Labs  Lab 03/16/20 0004 03/16/20 0810 03/17/20 0500 03/18/20 0541  NA 135 134* 133* 134*  K 4.0 3.8 3.9 3.8  CL 100 102 100 102  CO2 '24 22 26 23  ' GLUCOSE 285* 237* 282* 189*  BUN '17 14 14 15  ' CREATININE 0.73 0.70 0.73 0.86  CALCIUM 8.8* 8.3* 8.3* 8.7*  MG  --   --  1.8  --     GFR: Estimated Creatinine Clearance: 106.9 mL/min (by C-G formula based on SCr of 0.86 mg/dL).  Liver Function Tests: Recent Labs  Lab 03/16/20 0004  03/16/20 0810  AST 38 29  ALT 44 34  ALKPHOS 107 82  BILITOT 0.9 0.7  PROT 6.6 5.6*  ALBUMIN 3.1* 2.8*    CBG: Recent Labs  Lab 03/17/20 1151 03/17/20 1624 03/17/20 2036 03/18/20 0722 03/18/20 1149  GLUCAP 209* 216* 198* 184* 181*     Recent Results (from the past 240 hour(s))  Resp Panel by RT-PCR (Flu A&B, Covid) Nasopharyngeal Swab     Status: None   Collection Time: 03/16/20 12:04 AM   Specimen: Nasopharyngeal Swab; Nasopharyngeal(NP) swabs in vial transport medium  Result Value Ref Range Status   SARS Coronavirus 2 by RT PCR NEGATIVE NEGATIVE Final    Comment: (NOTE) SARS-CoV-2 target nucleic acids are NOT DETECTED.  The SARS-CoV-2 RNA is generally detectable in upper respiratory specimens during the acute phase of infection. The lowest concentration of SARS-CoV-2 viral copies this assay can detect is 138 copies/mL. A negative result does not preclude SARS-Cov-2 infection and should not be used as the sole basis for treatment or  other patient management decisions. A negative result may occur with  improper specimen collection/handling, submission of specimen other than nasopharyngeal swab, presence of viral mutation(s) within the areas targeted by this assay, and inadequate number of viral copies(<138 copies/mL). A negative result must be combined with clinical observations, patient history, and epidemiological information. The expected result is Negative.  Fact Sheet for Patients:  EntrepreneurPulse.com.au  Fact Sheet for Healthcare Providers:  IncredibleEmployment.be  This test is no t yet approved or cleared by the [place] FDA and  has been authorized for detection and/or diagnosis of SARS-CoV-2 by FDA under an Emergency Use Authorization (EUA). This EUA will remain  in effect (meaning this test can be used) for the duration of the COVID-19 declaration under Section 564(b)(1) of the Act, 21 U.S.C.section  360bbb-3(b)(1), unless the authorization is terminated  or revoked sooner.       Influenza A by PCR NEGATIVE NEGATIVE Final   Influenza B by PCR NEGATIVE NEGATIVE Final    Comment: (NOTE) The Xpert Xpress SARS-CoV-2/FLU/RSV plus assay is intended as an aid in the diagnosis of influenza from Nasopharyngeal swab specimens and should not be used as a sole basis for treatment. Nasal washings and aspirates are unacceptable for Xpert Xpress SARS-CoV-2/FLU/RSV testing.  Fact Sheet for Patients: EntrepreneurPulse.com.au  Fact Sheet for Healthcare Providers: IncredibleEmployment.be  This test is not yet approved or cleared by the [place] FDA and has been authorized for detection and/or diagnosis of SARS-CoV-2 by FDA under an Emergency Use Authorization (EUA). This EUA will remain in effect (meaning this test can be used) for the duration of the COVID-19 declaration under Section 564(b)(1) of the Act, 21 U.S.C. section 360bbb-3(b)(1), unless the authorization is terminated or revoked.  Performed at [facility], [place] [address], [place], [place] [address]   Culture, blood (routine x 2)     Status: None (Preliminary result)   Collection Time: 03/16/20  7:55 AM   Specimen: BLOOD  Result Value Ref Range Status   Specimen Description   Final    BLOOD LEFT ANTECUBITAL Performed at [facility] [address], [place], [place] [address]    Special Requests   Final    BOTTLES DRAWN AEROBIC ONLY Blood Culture adequate volume Performed at [facility] [address] [place], [place] [address]    Culture   Final    NO GROWTH 2 DAYS Performed at [facility] [address] [place], [place] [address]    Report Status PENDING  Incomplete  Culture, blood (routine x 2)     Status: None (Preliminary result)   Collection Time: 03/16/20  8:10 AM   Specimen: BLOOD  Result Value Ref Range  Status   Specimen Description   Final    BLOOD LEFT ANTECUBITAL Performed at [facility] [address] [place], [place] [address]    Special Requests   Final    BOTTLES DRAWN AEROBIC ONLY Blood Culture results may not be optimal due to an inadequate volume of blood received in culture bottles Performed at [facility] [address] [place], [place] [address]    Culture   Final    NO GROWTH 2 DAYS Performed at [facility] [address], [place], [place] [address]    Report Status PENDING  Incomplete         Radiology Studies: No results found.      Scheduled Meds:  enoxaparin (LOVENOX) injection  40 mg Subcutaneous Q24H   insulin aspart  0-9 Units Subcutaneous TID WC   insulin aspart  3 Units Subcutaneous TID WC   losartan  25 mg Oral Daily   nystatin   Topical BID   senna-docusate  1 tablet Oral BID   Continuous Infusions:   LOS: 1 day   Time spent: 67mns Greater than 50% of this time was spent in counseling, explanation of diagnosis, planning of further management, and coordination of care.  I have personally reviewed and interpreted on  03/18/2020 daily labs, tele strips, imagings as discussed above under date review session and assessment and plans.  I reviewed all nursing notes, pharmacy notes,  vitals, pertinent old records  I have discussed plan of care as described above with RN , patient  on 03/18/2020  Voice Recognition /Dragon dictation system was used to create this note, attempts have been made to correct errors. Please contact the author with questions and/or clarifications.   FFlorencia Reasons MD PhD FACP [facility]  Available via Epic secure chat 7am-7pm for nonurgent issues Please page for urgent issues To page the attending provider between 7A-7P or the covering provider during after hours 7P-7A, please log into the web site [email or web address] and access using universal [place]  password for that web site. If you do not have the password, please call the hospital operator.    03/18/2020, 4:57 PM
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
## 2020-03-19 ENCOUNTER — Inpatient Hospital Stay (HOSPITAL_COMMUNITY): Payer: Self-pay

## 2020-03-19 DIAGNOSIS — R0600 Dyspnea, unspecified: Secondary | ICD-10-CM

## 2020-03-19 LAB — GLUCOSE, CAPILLARY
Glucose-Capillary: 207 mg/dL — ABNORMAL HIGH (ref 70–99)
Glucose-Capillary: 184 mg/dL — ABNORMAL HIGH (ref 70–99)
Glucose-Capillary: 220 mg/dL — ABNORMAL HIGH (ref 70–99)
Glucose-Capillary: 223 mg/dL — ABNORMAL HIGH (ref 70–99)

## 2020-03-19 LAB — HEPATITIS PANEL, ACUTE
HCV Ab: NONREACTIVE
Hep A IgM: NONREACTIVE
Hep B C IgM: NONREACTIVE
Hepatitis B Surface Ag: NONREACTIVE

## 2020-03-19 LAB — URIC ACID: Uric Acid, Serum: 4.1 mg/dL (ref 2.5–7.1)

## 2020-03-19 LAB — SAVE SMEAR(SSMR), FOR PROVIDER SLIDE REVIEW

## 2020-03-19 IMAGING — CT CT ABD-PELV W/ CM
2 of 5 series · 16 of 46 positions shown, 18 images · IV contrast (APPLIED)
Comparison: CT angio chest [DATE]

CLINICAL DATA: Diffuse joint pain with marked eosinophilia.
Evaluate for splenomegaly, adenopathy or hepatomegaly.

EXAM:
CT ABDOMEN AND PELVIS WITH CONTRAST
TECHNIQUE: Multidetector CT imaging of the abdomen and pelvis was performed
using the standard protocol following bolus administration of
intravenous contrast.
CONTRAST:  100mL OMNIPAQUE IOHEXOL 300 MG/ML  SOLN

[Series 2: axial st · axial · 0.89mm/px · z∈[+1138,+1603]mm · 13 of 105 slices shown, 15 images]
[im 6/105  soft-tissue]
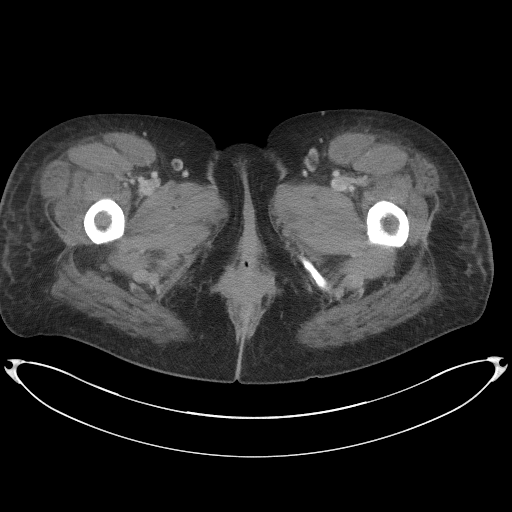
[im 6/105  bone]
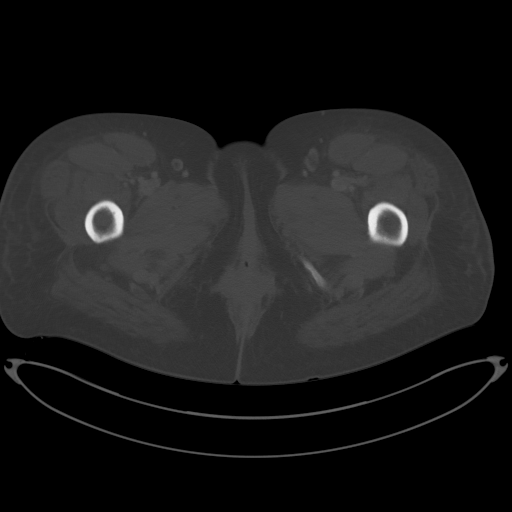
[im 12/105  soft-tissue]
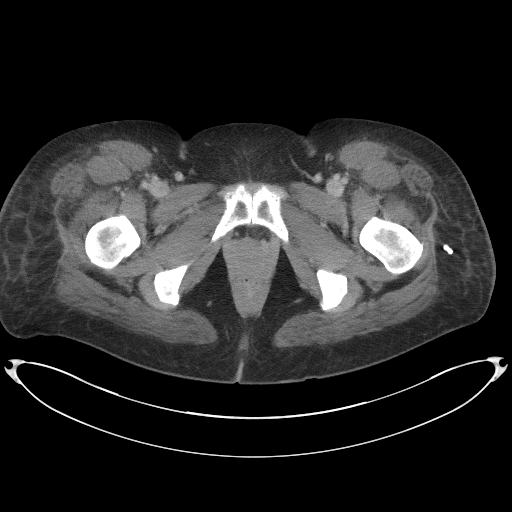
[im 24/105  soft-tissue]
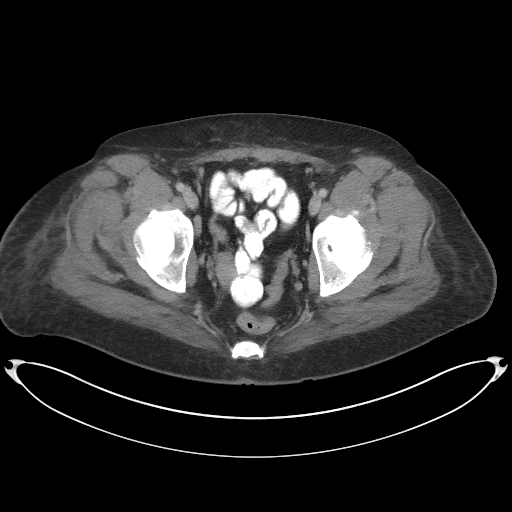
[im 29/105  soft-tissue]
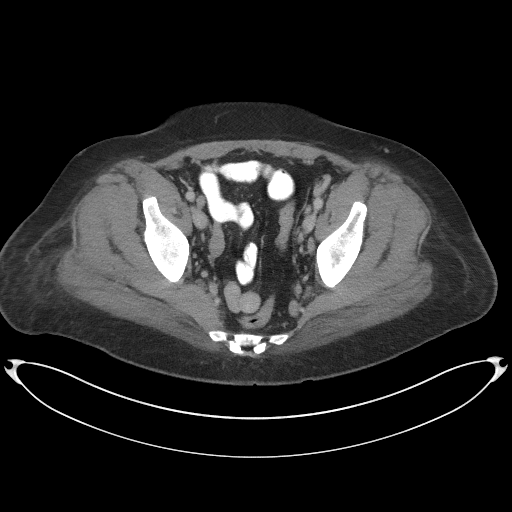
[im 35/105  soft-tissue]
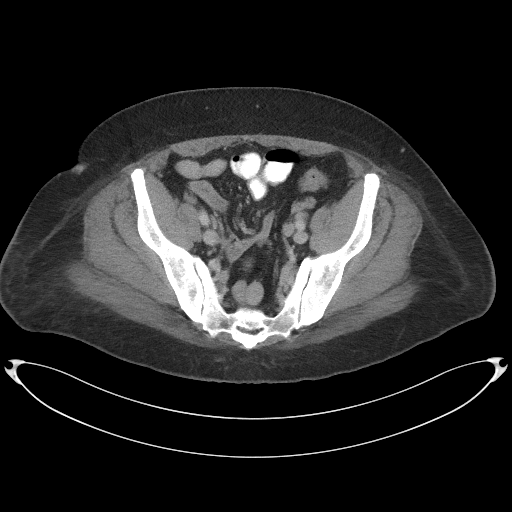
[im 47/105  soft-tissue]
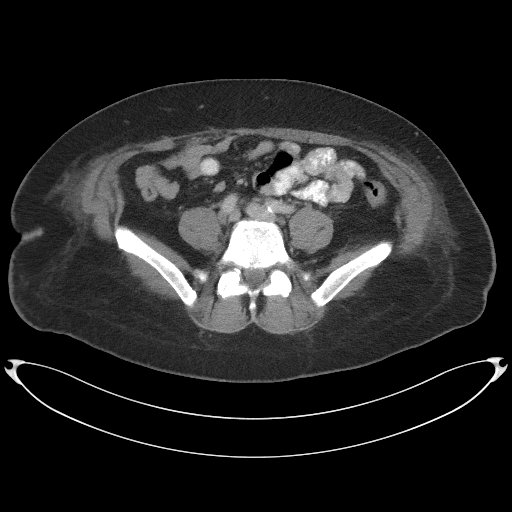
[im 53/105  soft-tissue]
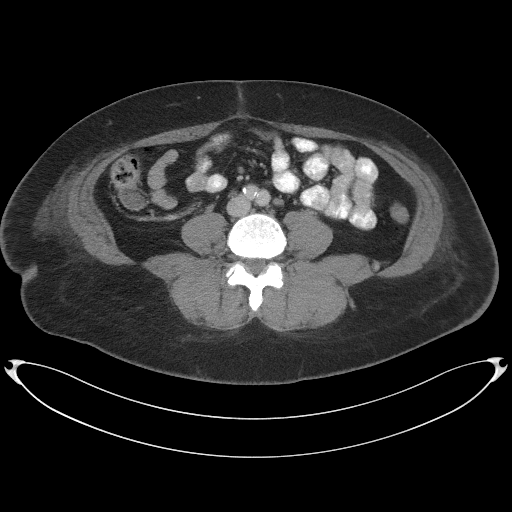
[im 58/105  soft-tissue]
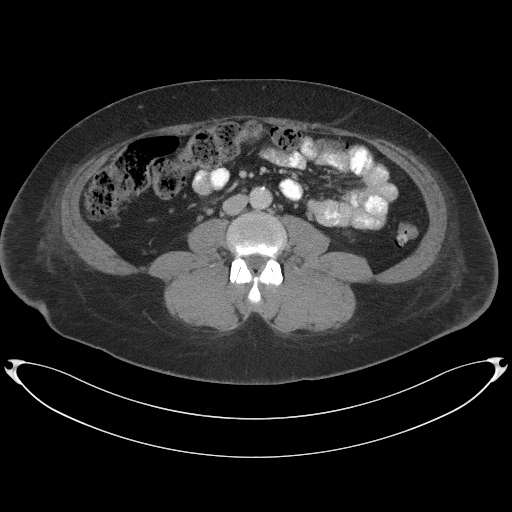
[im 70/105  soft-tissue]
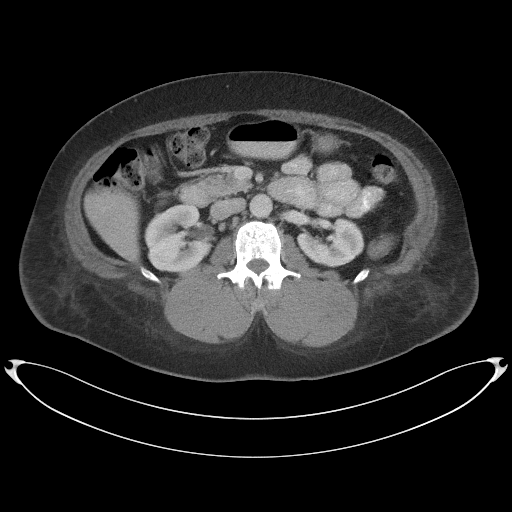
[im 70/105  bone]
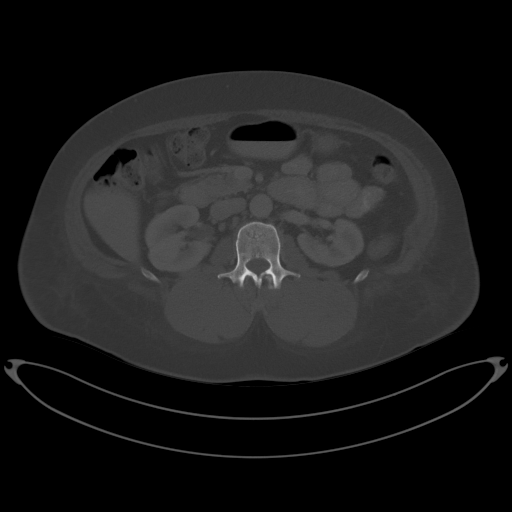
[im 76/105  soft-tissue]
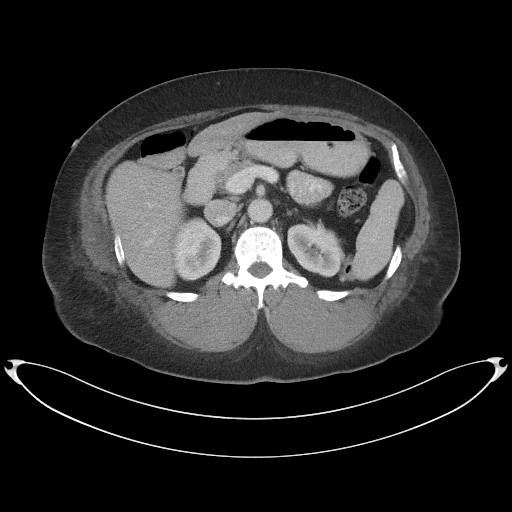
[im 81/105  soft-tissue]
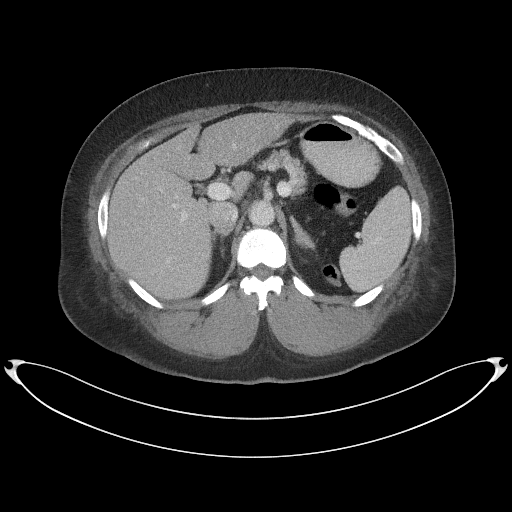
[im 93/105  soft-tissue]
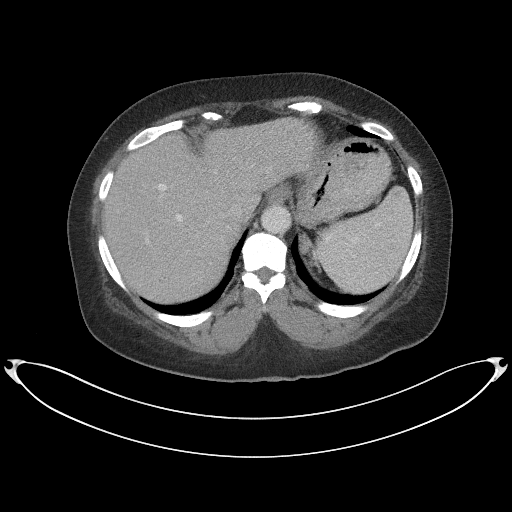
[im 99/105  soft-tissue]
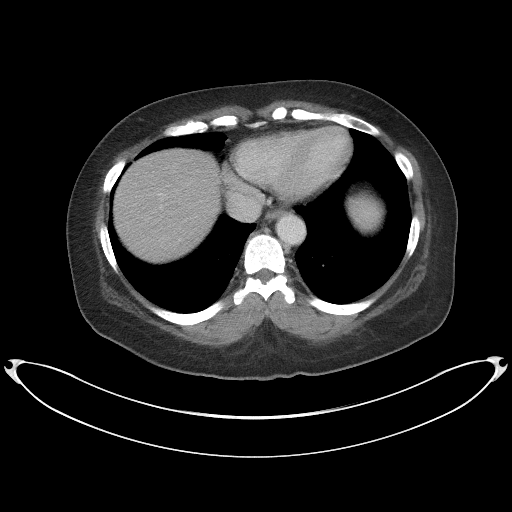

[Series 5: coronal st · coronal · 1.02mm/px · 3 of 99 slices shown]
[im 33/99  soft-tissue]
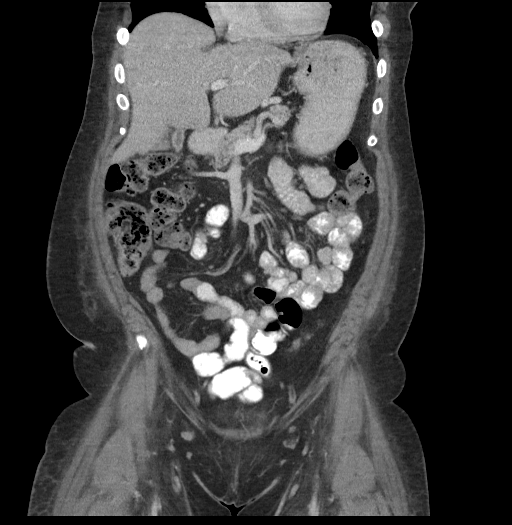
[im 44/99  soft-tissue]
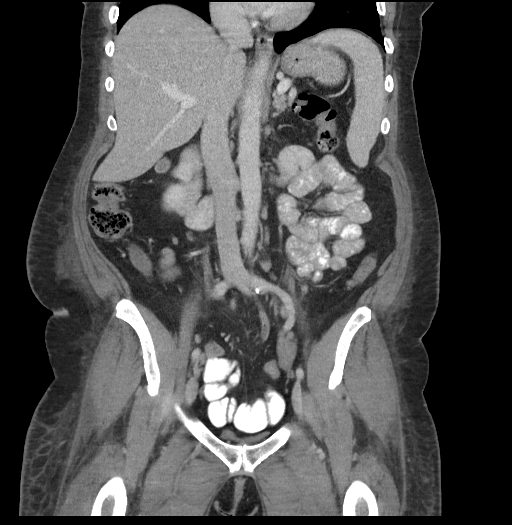
[im 55/99  soft-tissue]
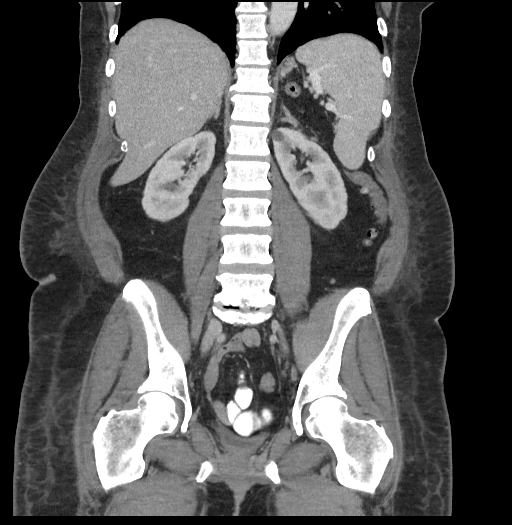

[16 of 46 positions shown; findings below may reference images not displayed]

FINDINGS: Lower chest: No acute abnormality.

Hepatobiliary: No focal liver abnormality is seen. The liver has an
approximate volume of [5W] cc with a length of 19.58. No gallstones,
gallbladder wall thickening, or biliary dilatation.

Pancreas: Unremarkable. No pancreatic ductal dilatation or
surrounding inflammatory changes.

Spleen: This lean measures 14.3 by 10.3 x 6.2 cm (volume = 480
cm^3) no focal liver lesion identified.

Adrenals/Urinary Tract: Normal appearance of the adrenal glands.
Small simple appearing cyst within upper pole of left kidney
measures 1.2 cm, image [DATE]. The urinary bladder appears normal.

Stomach/Bowel: Stomach is nondistended. No bowel wall thickening,
inflammation, or distension.

Vascular/Lymphatic: Aortic atherosclerosis. No aneurysm. No
abdominal adenopathy. Prominent bilateral pelvic lymph nodes are
identified a few of which are borderline enlarged including left
external iliac node measuring 1.1 cm, image 79/2. Right external
iliac lymph node measures 1.0 cm.

Reproductive: Status post hysterectomy. No adnexal masses.

Other: No free fluid or fluid collections identified.

Musculoskeletal: Similar to recent MR findings there is abnormal
edema and soft tissue stranding surrounding the skeletal muscles
compatible with generalized myositis. No aggressive lytic or
sclerotic bone lesions identified.

Disc space narrowing and endplate spurring is identified at L5-S1
along with vacuum disc phenomenon. However, the endplates appear
irregular with loss of normal definition with increasing lucency.
There is no surrounding increased soft tissue, soft tissue stranding
or fluid collection.
IMPRESSION: 1. Splenomegaly.
2. Abnormal edema and soft tissue stranding surrounding the skeletal
muscles compatible with generalized myositis.
3. Disc space narrowing and endplate spurring at L5-S1 along with
vacuum disc phenomenon. However, the endplates appear irregular with
loss of normal definition and increasing lucency. No surrounding
increased soft tissue, soft tissue stranding or fluid collection.
Findings are favored to represent degenerative disc disease.
However, given the elevated white blood cell count, ESR, and CRP
early discitis cannot be excluded. If there are clinical signs or
symptoms suggestive of discitis at L5-S1 consider further
investigation with contrast enhanced MRI of the lumbar spine.
4. Aortic atherosclerosis.

Aortic Atherosclerosis ([5W]-[5W]).

These results will be called to the ordering clinician or
representative by the Radiologist Assistant, and communication
documented in the PACS or [REDACTED].

## 2020-03-19 IMAGING — DX DG HAND 2V*L*
2 series · 2 of 2 positions shown · non-contrast
Comparison: None.

CLINICAL DATA: Pain

EXAM:
LEFT HAND - 2 VIEW

[hand pa]
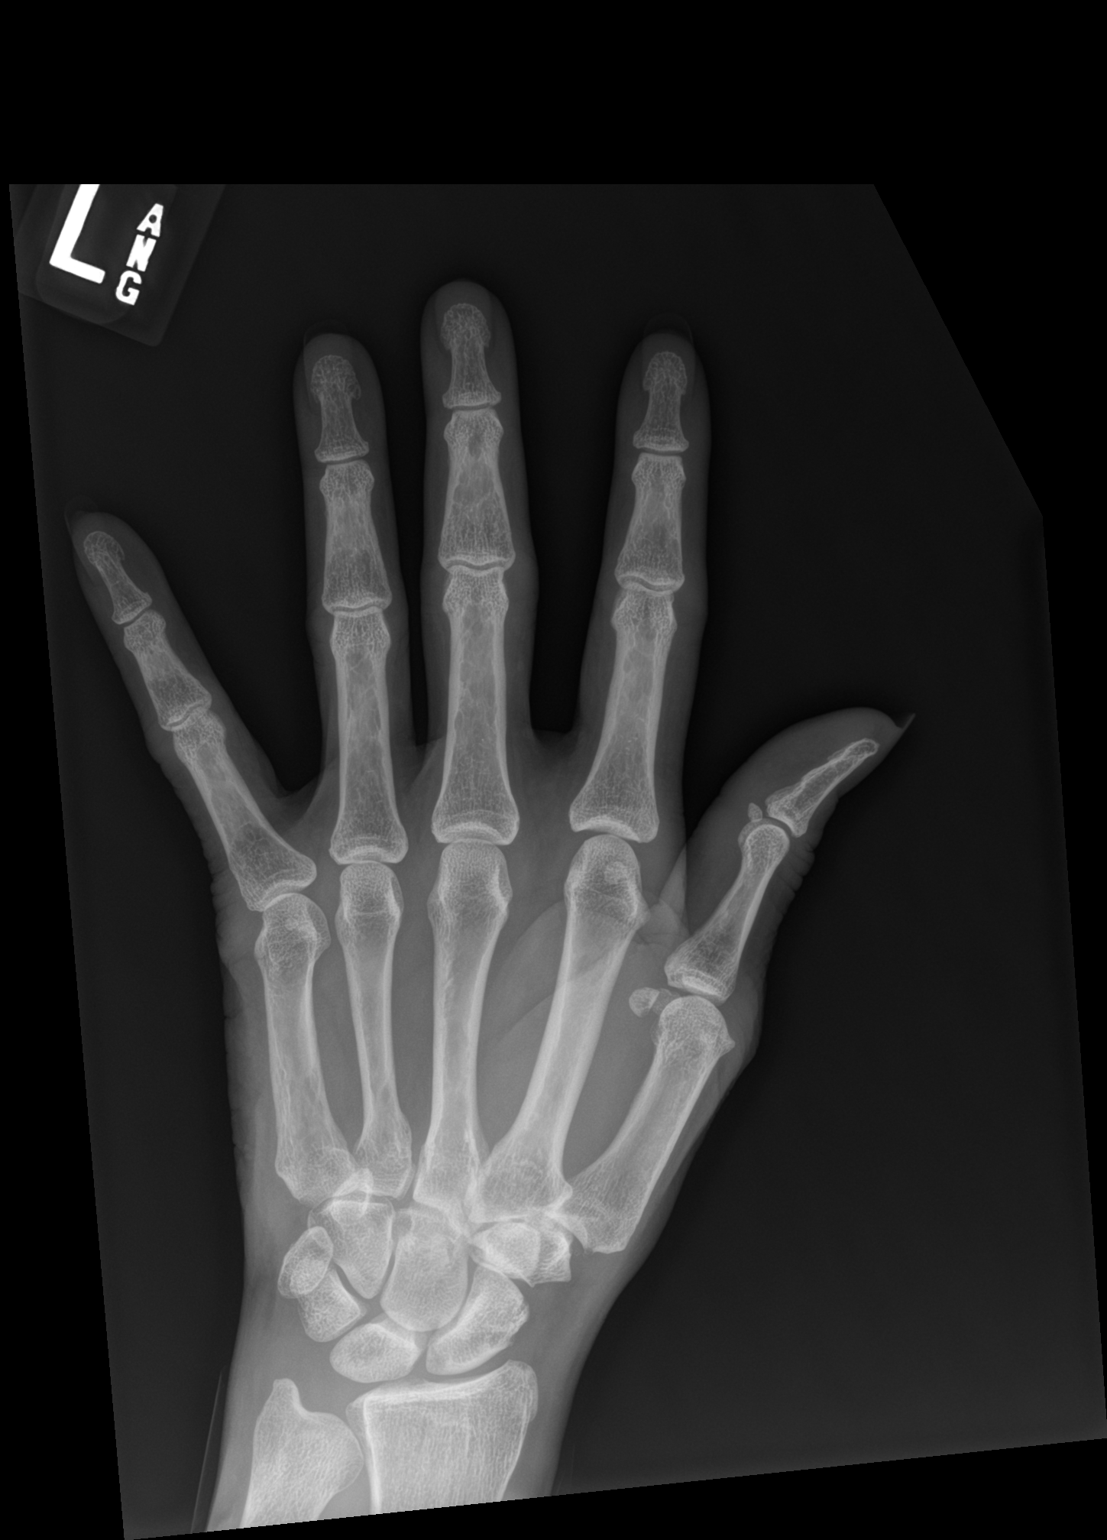

[hand lat]
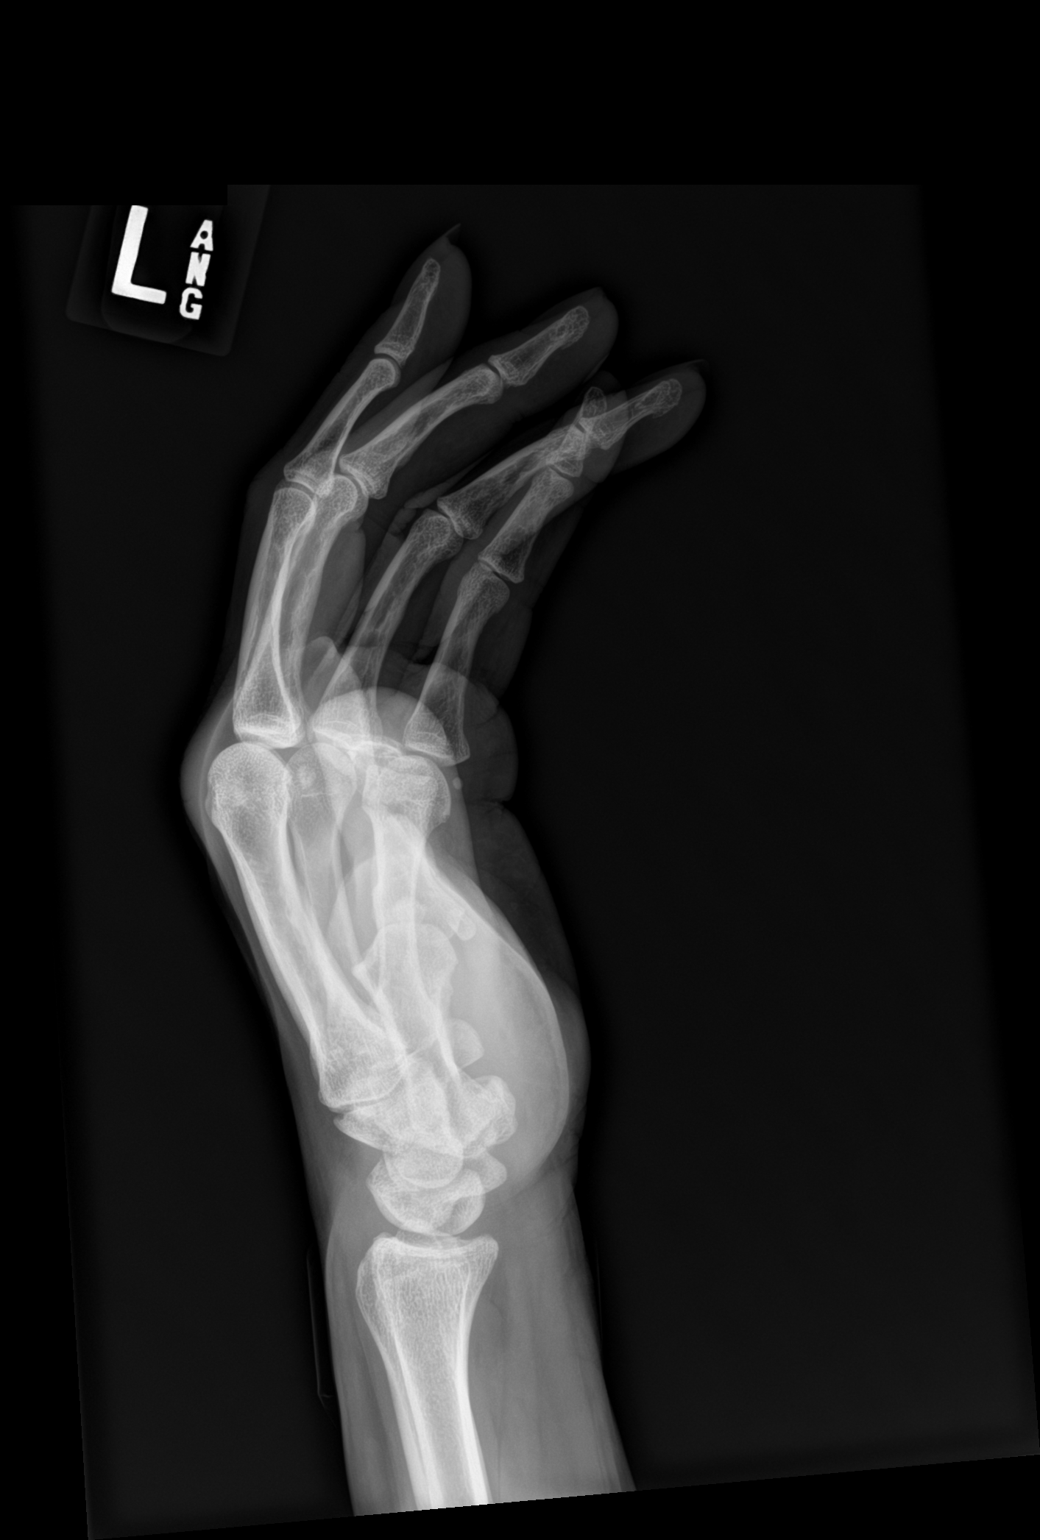

[2 of 2 positions shown; findings below may reference images not displayed]

FINDINGS: Frontal and lateral views were obtained. No fracture or dislocation.
Joint spaces appear normal. No erosive change or periostitis. Bony
mineralization normal.
IMPRESSION: No fracture or dislocation.  No evident arthropathy.
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
## 2020-03-19 IMAGING — DX DG HAND 2V*R*
2 series · 2 of 2 positions shown · non-contrast
Comparison: None.

CLINICAL DATA: Pain

EXAM:
RIGHT HAND - 2 VIEW

[hand pa]
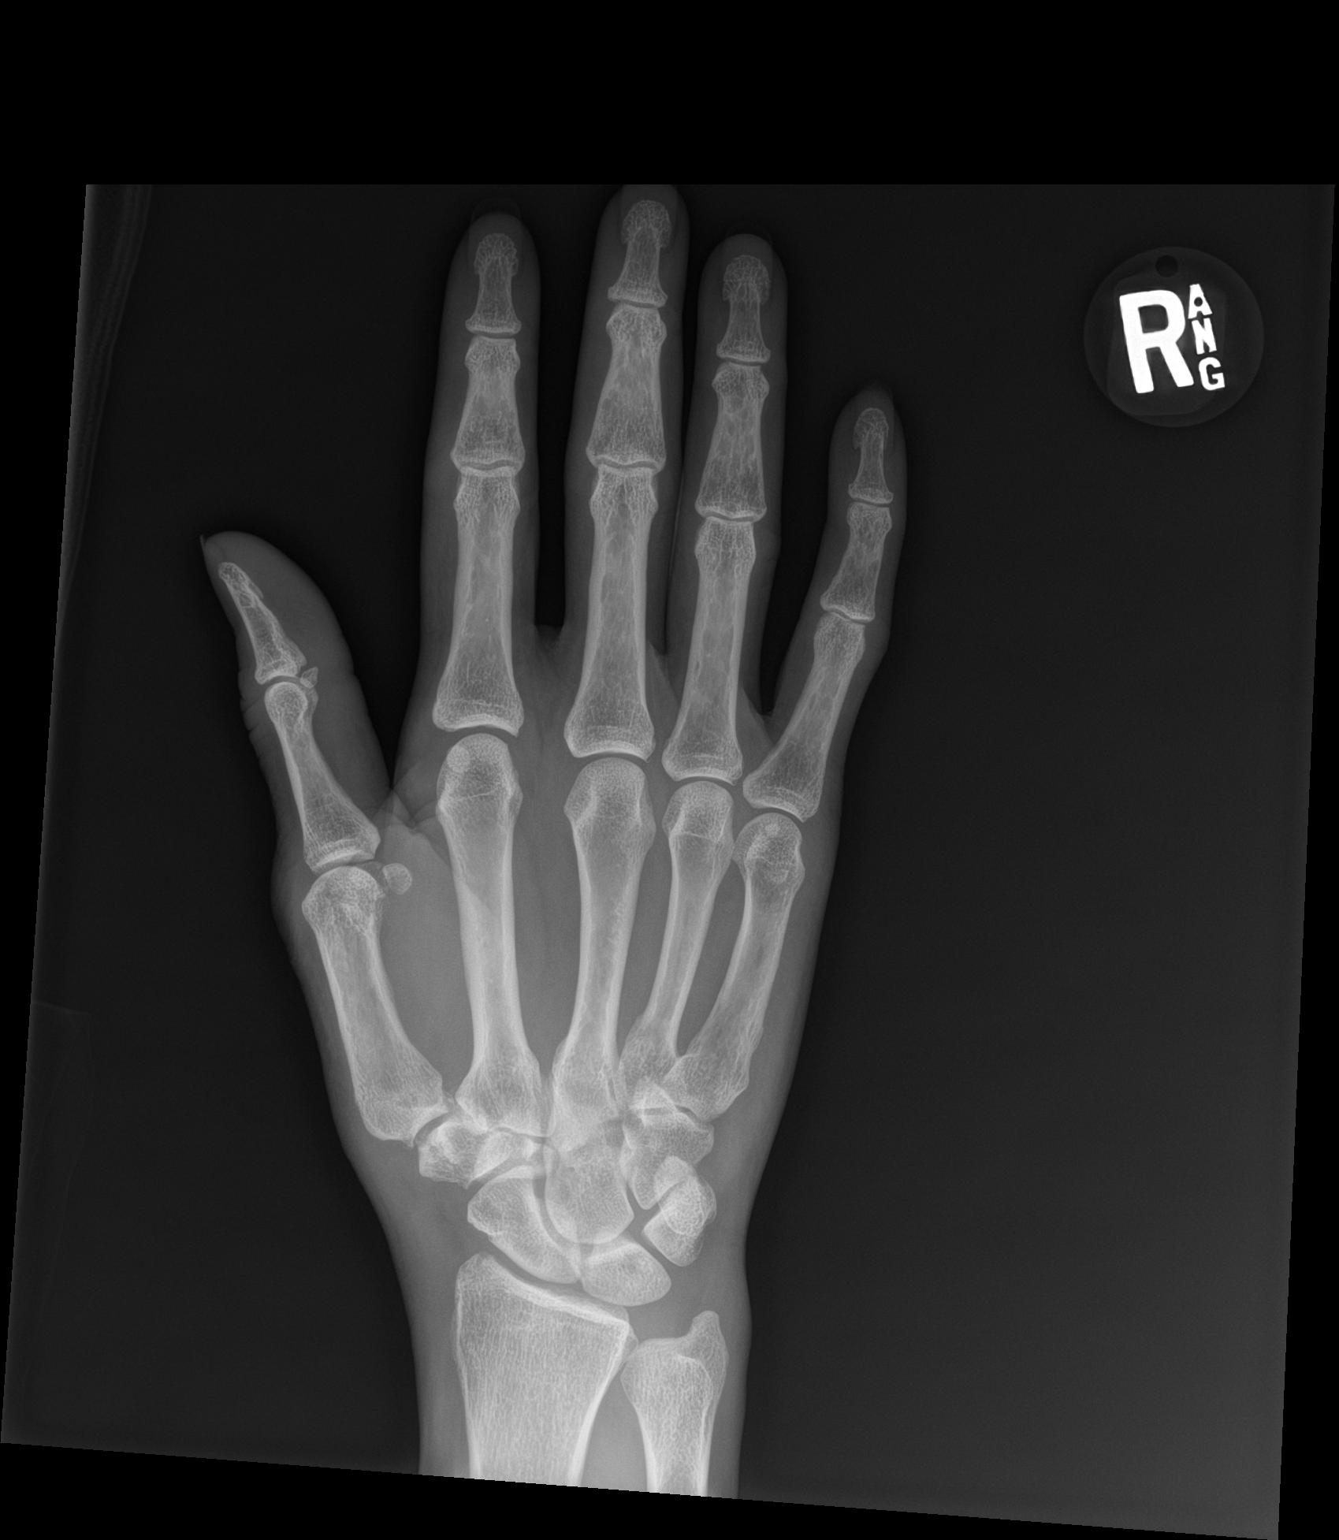

[hand lat]
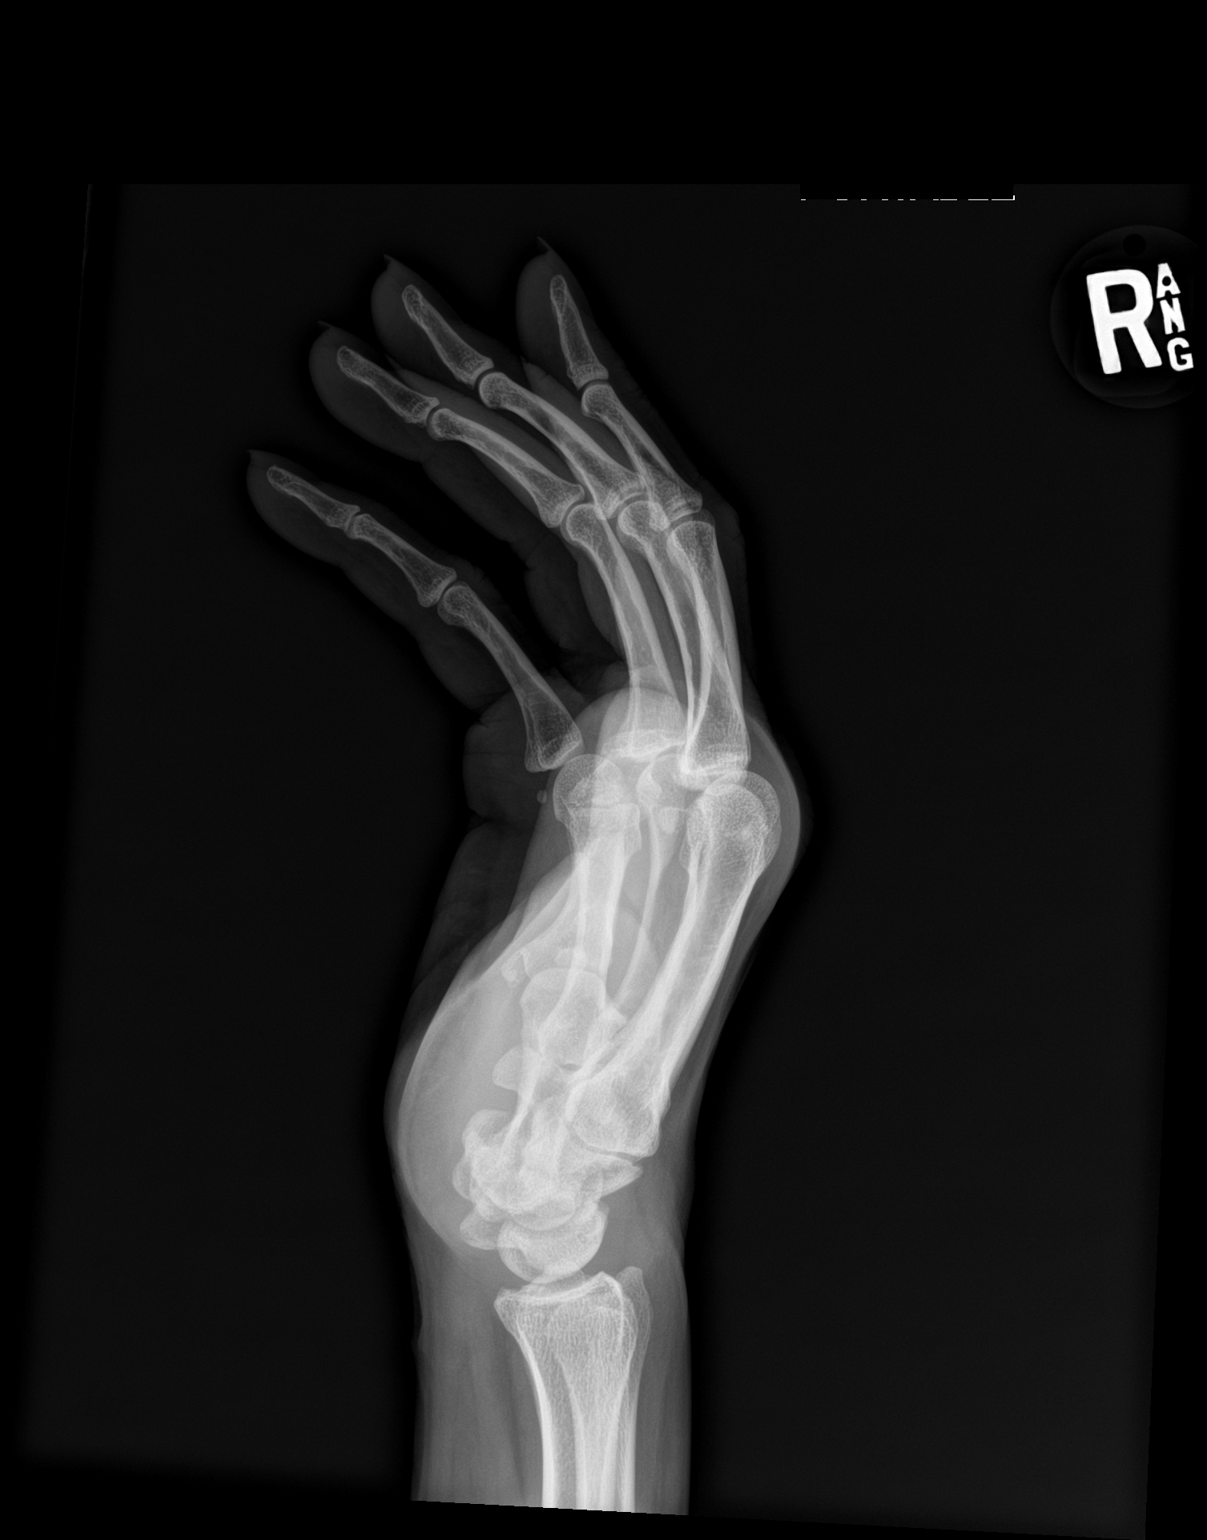

[2 of 2 positions shown; findings below may reference images not displayed]

FINDINGS: Frontal and lateral views were obtained. No appreciable fracture or
dislocation. Joint spaces appear unremarkable. No erosive change or
periostitis. Bony mineralization unremarkable.
IMPRESSION: No fracture or dislocation.  No appreciable arthropathic change.
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
## 2020-03-19 IMAGING — MR MR LUMBAR SPINE WO/W CM
6 of 9 series · 29 of 48 positions shown · IV contrast (gadavist)
Comparison: CT abdomen and pelvis [DATE]. Thoracic spine MRI
[DATE].

CLINICAL DATA: Low back pain.

EXAM:
MRI LUMBAR SPINE WITHOUT AND WITH CONTRAST
TECHNIQUE: Multiplanar and multiecho pulse sequences of the lumbar spine were
obtained without and with intravenous contrast.
CONTRAST:  10mL GADAVIST GADOBUTROL 1 MMOL/ML IV SOLN

[Series 5: T1 · sagittal · 4.0mm · 0.81mm/px · 4 of 14 slices shown (1 of 2)]
[im 1/14]
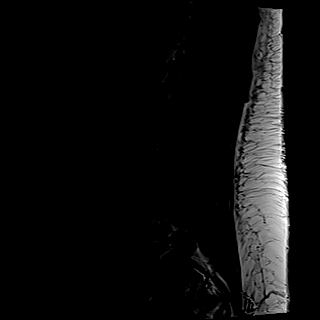
[im 5/14]
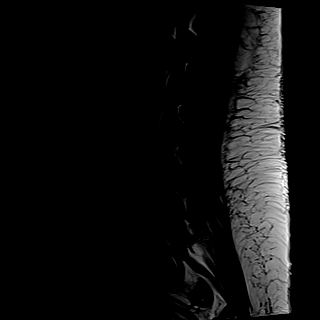
[im 9/14]
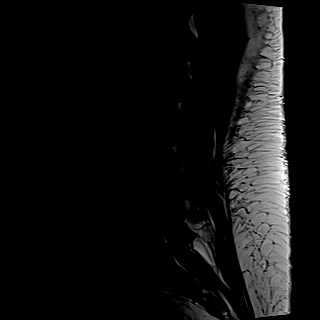
[im 14/14]
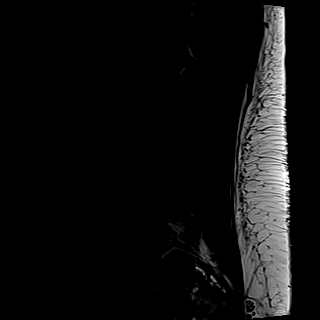

[Series 6: STIR · sagittal · 4.0mm · 0.51mm/px · 3 of 14 slices shown]
[im 1/14]
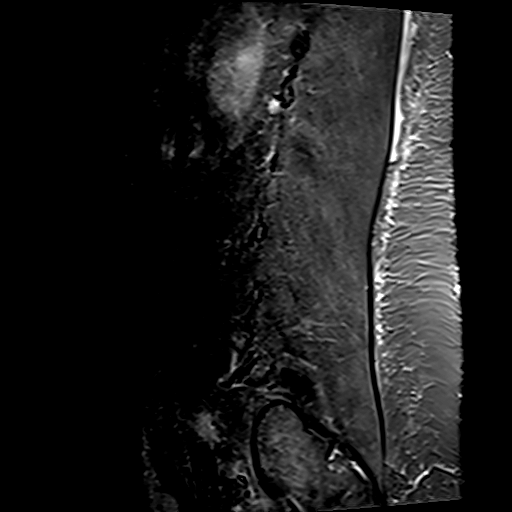
[im 7/14]
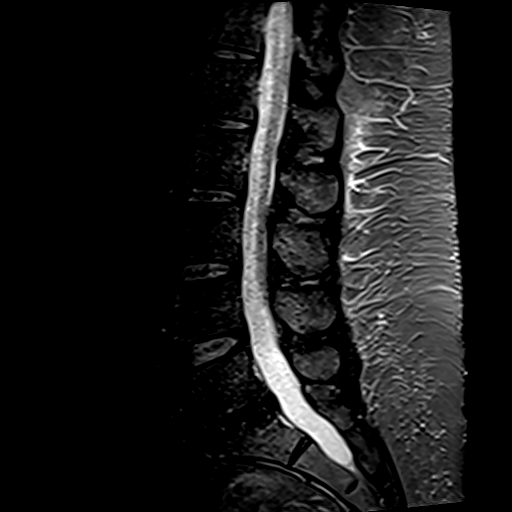
[im 14/14]
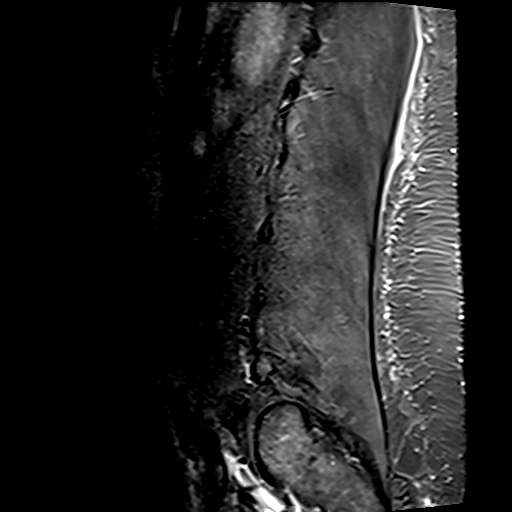

[Series 7: T2 · axial · 4.0mm · 0.62mm/px · z∈[-84,+125]mm · 8 of 43 slices shown]
[im 1/43]
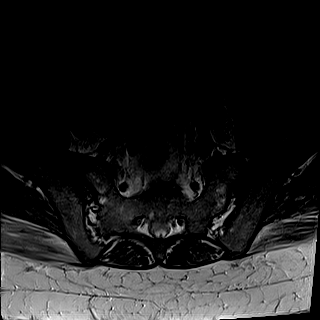
[im 5/43]
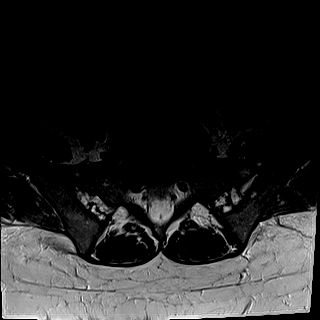
[im 15/43]
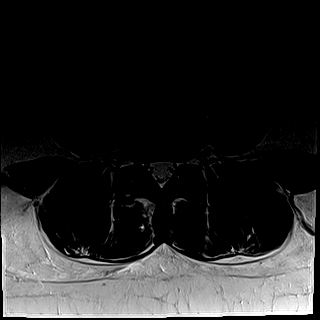
[im 19/43]
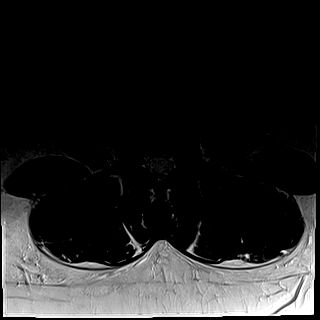
[im 24/43]
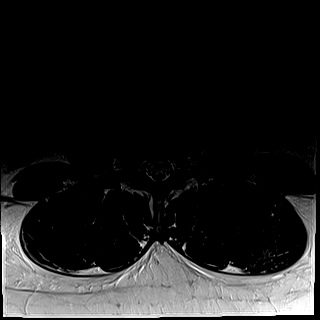
[im 29/43]
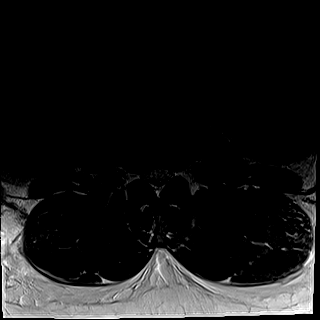
[im 38/43]
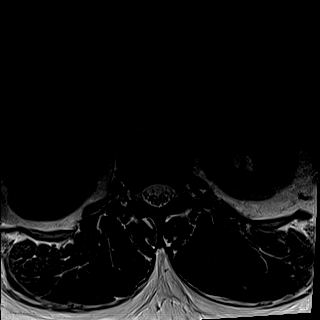
[im 43/43]
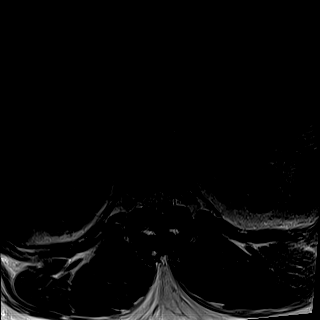

[Series 8: T1 · axial · 4.0mm · 0.39mm/px · z∈[-84,+125]mm · 8 of 43 slices shown (2 of 2)]
[im 1/43]
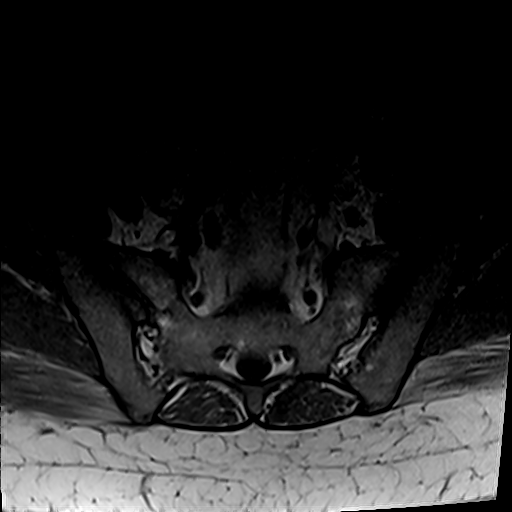
[im 5/43]
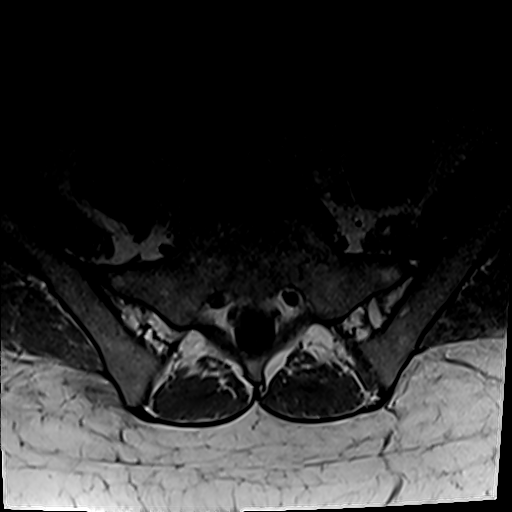
[im 15/43]
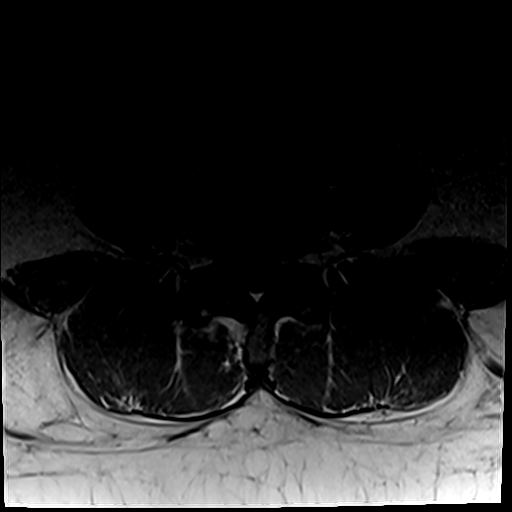
[im 19/43]
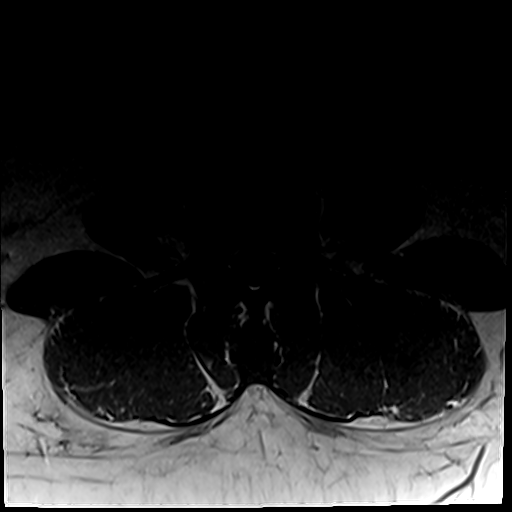
[im 24/43]
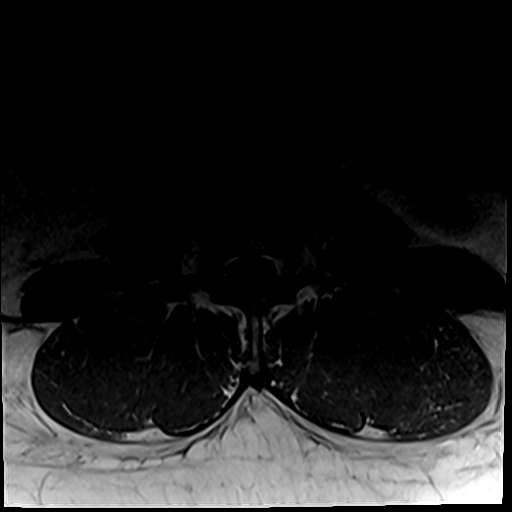
[im 29/43]
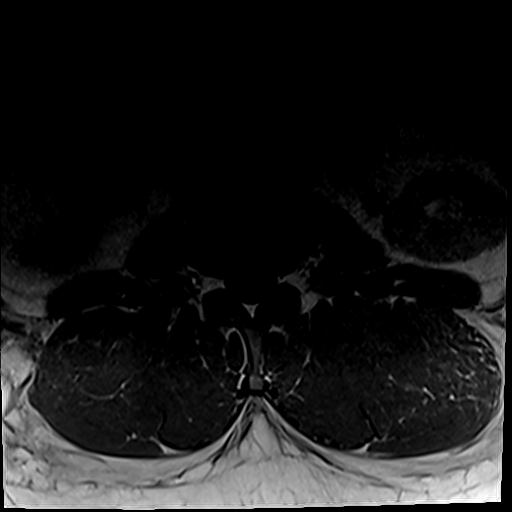
[im 38/43]
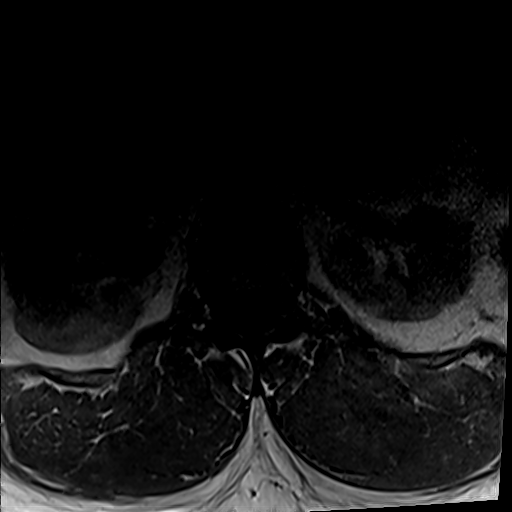
[im 43/43]
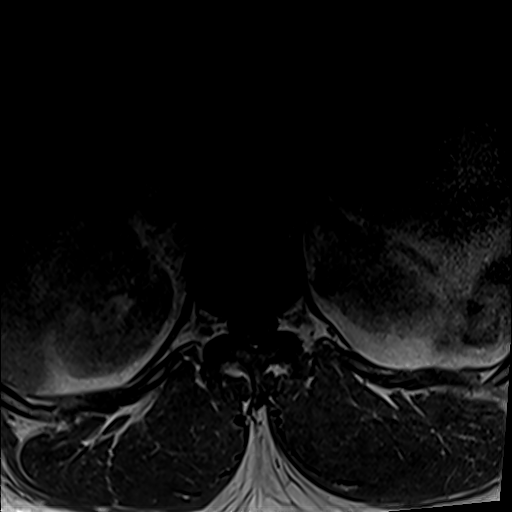

[Series 13: T2 post-contrast · sagittal · 4.0mm · 0.81mm/px · 3 of 14 slices shown]
[im 1/14]
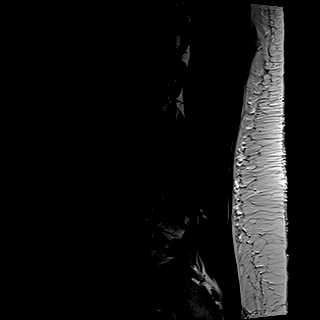
[im 7/14]
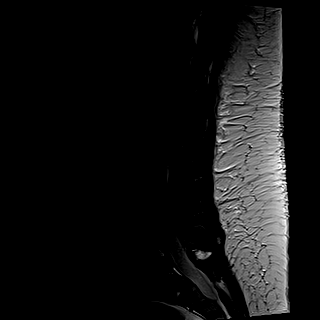
[im 14/14]
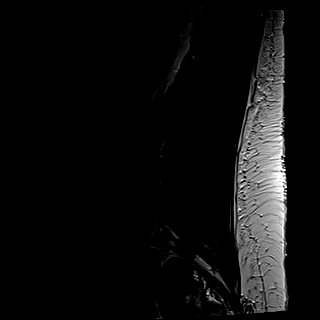

[Series 14: T1 fat-sat post-contrast · sagittal · 4.0mm · 0.81mm/px · 3 of 14 slices shown]
[im 1/14]
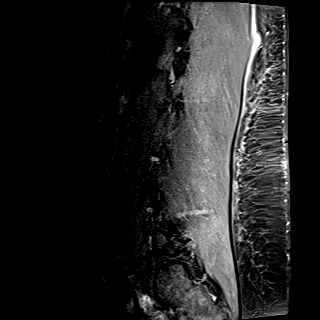
[im 7/14]
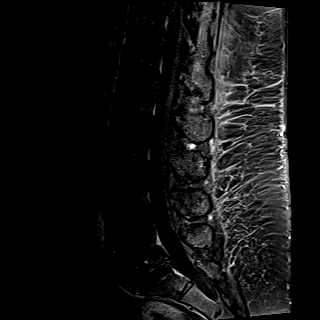
[im 14/14]
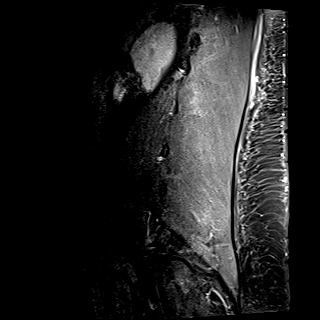

[29 of 48 positions shown; findings below may reference images not displayed]

FINDINGS: Segmentation:  Standard.

Alignment:  Normal.

Vertebrae: No fracture, suspicious osseous lesion, significant
marrow edema, or evidence of discitis. Chronic degenerative endplate
changes at L5-S1. No epidural fluid collection.

Conus medullaris and cauda equina: Conus extends to the T12 level.
Conus and cauda equina appear normal.

Paraspinal and other soft tissues: No paraspinal fluid collection.
No significant lumbar paraspinal muscle edema in comparison to the
thoracic edema on the prior MRI. 1.2 cm left renal cyst.

Disc levels:

L1-2 and L1-2: Normal discs.  Mild facet arthrosis without stenosis.

L3-4: Minimal disc bulging and mild facet arthrosis without
stenosis.

L4-5: Mild disc bulging and mild facet arthrosis without stenosis.

L5-S1: Disc desiccation and moderate disc space narrowing. Disc
bulging, endplate spurring, disc space height loss, and mild facet
arthrosis result in mild right neural foraminal stenosis and mild
left lateral recess stenosis without spinal stenosis.
IMPRESSION: 1. No evidence of lumbar spine infection.
2. Lower lumbar disc and facet degeneration, greatest at L5-S1 where
there is mild right neural foraminal and left lateral recess
stenosis.

## 2020-03-19 MED ORDER — IOHEXOL 300 MG/ML  SOLN
100.0000 mL | Freq: Once | INTRAMUSCULAR | Status: AC | PRN
  Administered 2020-03-19: 100 mL via INTRAVENOUS

## 2020-03-19 MED ORDER — GADOBUTROL 1 MMOL/ML IV SOLN
10.0000 mL | Freq: Once | INTRAVENOUS | Status: AC | PRN
  Administered 2020-03-19: 10 mL via INTRAVENOUS

## 2020-03-19 MED ORDER — METOPROLOL TARTRATE 25 MG PO TABS
12.5000 mg | ORAL_TABLET | Freq: Two times a day (BID) | ORAL | Status: DC
  Administered 2020-03-19 – 2020-03-31 (×25): 12.5 mg via ORAL
  Filled 2020-03-19 (×25): qty 1

## 2020-03-19 MED ORDER — MELATONIN 3 MG PO TABS
3.0000 mg | ORAL_TABLET | Freq: Every day | ORAL | Status: DC
  Administered 2020-03-19 – 2020-03-30 (×12): 3 mg via ORAL
  Filled 2020-03-19 (×12): qty 1

## 2020-03-19 NOTE — Consult Note (Signed)
Referral MD  Reason for Referral: Eosinophilia; severe joint pain  Chief Complaint  Patient presents with  . Joint Pain  : I am just in a lot of pain because my joints are so painful.  HPI: Kaitlin Holloway is a very charming 49 year old Afro-American female.  She has poorly controlled diabetes.  She finally has come to "grips" with the fact that she has diabetes.  She began to note severe joint pain recently.  She had dyspnea.  The dyspnea brought her to the hospital.  She is having some chest wall discomfort.  She had no cough.  She had no fever.  She was admitted on 03/15/2020.  When she was admitted, her white cell count was 19.2.  Hemoglobin 13.8.  Platelet count 214,000.  However she has 62% eosinophils. Her sodium was 135.  Potassium 4.0.  Blood sugar on admission was 285.  Her BUN was 17 creatinine 0.7.  Calcium 8.8 with an albumin of 3.1.  Her liver function studies were okay.  She had a markedly elevated creatine kinase of 1219.  She had a CT angiogram of the chest on 03/16/2020.  This was relatively unremarkable.  There is no lymphadenopathy.  There is no pulmonary nodularity.  She had a rheumatologic studies done.  She had markedly elevated rheumatoid arthritis level of 650.  Her CCP antibody was 176.  She had an echocardiogram done.  She had good ejection fraction of 60-65%.  Show small pericardial effusion.  Her valves looked okay.  It was felt that she has some type of rheumatologic condition.  She was supposed to be transferred over to [facility] because of this likely rheumatologic condition.  However, there are no beds.  The rheumatologist at [facility] was worried about her having underlying oncologic or hematologic condition because of the markedly elevated eosinophils.  As such, we are consulted for this.  There is no family history.  She has this rash on her thighs.  This is somewhat pruritic.  She has onychomycosis in her toes nails.  I suspect this probably is from the  diabetes is poorly controlled.  She has had no weight loss.  She has had no problems with bowels or bladder.  She is little constipated.  She has had no palpable adenopathy.  Currently, on the right radius, she has a nodule.  She has no risk factor for HIV or hepatitis.  She apparently had Covid a year ago.,  Sure if this is some late term manifestation of Covid.  She has had no travel recently.  There is been no obvious change in medications.  She has been taking a lot of Advil because of her joint pains.  I would have to say that currently, her performance status is ECOG 1.   Past Medical History:  Diagnosis Date  . Arthritis    knee  . COVID-19 06/2018   Residual joint pain  . Diabetes mellitus without complication (HCC)    Type 2, does not take her meds.  . Hypertension 2002  . Joint pain    since having COVID 06/2018  . Motion sickness    boats, car - back seat  . Prediabetes 2016  :  Past Surgical History:  Procedure Laterality Date  . ABDOMINAL HYSTERECTOMY  2016   unilateral oophorectomy.  Does not know what side.  For fibroids and heavy bleeding.  . APPENDECTOMY     age 66  . CATARACT EXTRACTION W/PHACO Right 01/28/2020   Procedure: CATARACT EXTRACTION PHACO AND INTRAOCULAR  LENS PLACEMENT (IOC) RIGHT VISION BLUE 13.73  01:10.8;  Surgeon: Marchia Meiers, MD;  Location: [facility];  Service: Ophthalmology;  Laterality: Right;  Diabetes Latex  . TONSILLECTOMY    :   Current Facility-Administered Medications:  .  acetaminophen (TYLENOL) tablet 650 mg, 650 mg, Oral, Q6H PRN **OR** acetaminophen (TYLENOL) suppository 650 mg, 650 mg, Rectal, Q6H PRN, Rise Patience, MD .  enoxaparin (LOVENOX) injection 40 mg, 40 mg, Subcutaneous, Q24H, Florencia Reasons, MD, 40 mg at 03/18/20 1822 .  fentaNYL (SUBLIMAZE) injection 12.5 mcg, 12.5 mcg, Intravenous, Q2H PRN, Rise Patience, MD, 12.5 mcg at 03/19/20 0420 .  insulin aspart (novoLOG) injection 0-9 Units, 0-9  Units, Subcutaneous, TID WC, Rise Patience, MD, 3 Units at 03/19/20 0750 .  insulin aspart (novoLOG) injection 3 Units, 3 Units, Subcutaneous, TID WC, Florencia Reasons, MD, 3 Units at 03/19/20 0749 .  losartan (COZAAR) tablet 25 mg, 25 mg, Oral, Daily, Florencia Reasons, MD, 25 mg at 03/18/20 1046 .  nystatin (MYCOSTATIN/NYSTOP) topical powder, , Topical, BID, Florencia Reasons, MD, Given at 03/18/20 2253 .  senna-docusate (Senokot-S) tablet 1 tablet, 1 tablet, Oral, BID, Florencia Reasons, MD, 1 tablet at 03/18/20 2250:  . enoxaparin (LOVENOX) injection  40 mg Subcutaneous Q24H  . insulin aspart  0-9 Units Subcutaneous TID WC  . insulin aspart  3 Units Subcutaneous TID WC  . losartan  25 mg Oral Daily  . nystatin   Topical BID  . senna-docusate  1 tablet Oral BID  :  Allergies  Allergen Reactions  . Penicillins Other (See Comments)    Has patient had a PCN reaction causing immediate rash, facial/tongue/throat swelling, SOB or lightheadedness with hypotension: Yes Has patient had a PCN reaction causing severe rash involving mucus membranes or skin necrosis: No Has patient had a PCN reaction that required hospitalization: No Has patient had a PCN reaction occurring within the last 10 years: No If all of the above answers are "NO", then may proceed with Cephalosporin use.   . Aspirin Other (See Comments)    Muscle spasms in back  . Garlic Swelling  . Morphine And Related Other (See Comments)    Headaches/ migraine  . Latex Rash    Gloves - when worn  :  Family History  Problem Relation Age of Onset  . Heart disease Mother 59       AMI  . Diabetes Mother   . Hypertension Mother   . Hyperlipidemia Mother   :  Social History   Socioeconomic History  . Marital status: Single    Spouse name: Not on file  . Number of children: 1  . Years of education: Not on file  . Highest education level: Master's degree (e.g., MA, MS, MEng, MEd, MSW, MBA)  Occupational History  . Occupation: Academic librarian.  Tobacco Use  . Smoking status: Former Smoker    Packs/day: 1.50    Years: 17.00    Pack years: 25.50    Types: Cigarettes    Quit date: 07/10/2000    Years since quitting: 19.7  . Smokeless tobacco: Never Used  Vaping Use  . Vaping Use: Never used  Substance and Sexual Activity  . Alcohol use: No    Comment: rare  . Drug use: No    Comment: MJ when young  . Sexual activity: Not on file  Other Topics Concern  . Not on file  Social History Narrative   Lives alone, no support.Marland Kitchen   Has  been hard during [place] pandemic.    Social Determinants of Health   Financial Resource Strain:   . Difficulty of Paying Living Expenses: Not on file  Food Insecurity:   . Worried About Charity fundraiser in the Last Year: Not on file  . Ran Out of Food in the Last Year: Not on file  Transportation Needs:   . Lack of Transportation (Medical): Not on file  . Lack of Transportation (Non-Medical): Not on file  Physical Activity:   . Days of Exercise per Week: Not on file  . Minutes of Exercise per Session: Not on file  Stress:   . Feeling of Stress : Not on file  Social Connections:   . Frequency of Communication with Friends and Family: Not on file  . Frequency of Social Gatherings with Friends and Family: Not on file  . Attends Religious Services: Not on file  . Active Member of Clubs or Organizations: Not on file  . Attends Archivist Meetings: Not on file  . Marital Status: Not on file  Intimate Partner Violence:   . Fear of Current or Ex-Partner: Not on file  . Emotionally Abused: Not on file  . Physically Abused: Not on file  . Sexually Abused: Not on file  :  Review of Systems  Constitutional: Positive for malaise/fatigue.  Eyes: Negative.   Respiratory: Positive for shortness of breath.   Cardiovascular: Positive for chest pain.  Gastrointestinal: Positive for constipation.  Genitourinary: Negative.   Musculoskeletal: Positive for joint  pain and myalgias.  Skin: Positive for rash.  Neurological: Negative.   Endo/Heme/Allergies: Negative.   Psychiatric/Behavioral: Negative.      Exam:  This is a well-developed and well-nourished African-American female in no obvious distress.  Vital signs are temperature 98.  Pulse 81.  Blood pressure 145/88.  Head and neck exam shows no scleral icterus.  There is no oral lesions.  There is no adenopathy in the neck.  Thyroid is nonpalpable.  Lungs are clear bilaterally.  No wheezes are noted.  Is no friction rub.  Cardiac exam is regular rate and rhythm with no murmurs, rubs or bruits.  Abdomen is soft.  She has no palpable liver or spleen tip.  There is no fluid wave.  There is no guarding or rebound tenderness.  Her extremities shows no clubbing, cyanosis or edema.  She has decreased range of motion of her joints because of stiffness.  She does have this nodule on the proximal tibia.  This is slightly tender.  Skin exam does show this macular rash on the upper inner thighs.  It is slightly scaly.  Neurological exam is nonfocal.  Patient Vitals for the past 24 hrs:  BP Temp Temp src Pulse Resp SpO2  03/19/20 0428 (!) 145/88 98 F (36.7 C) Oral 81 20 100 %  03/18/20 2123 (!) 155/89 98.9 F (37.2 C) Oral 88 16 98 %  03/18/20 1800 (!) 148/92 -- -- 87 -- --  03/18/20 1151 (!) 151/92 98 F (36.7 C) -- 81 18 99 %  03/18/20 1054 (!) 149/82 -- -- 100 -- --     Recent Labs    03/17/20 0500 03/18/20 0541  WBC 18.2* 18.2*  HGB 13.1 13.9  HCT 37.1 39.8  PLT 208 206   Recent Labs    03/17/20 0500 03/18/20 0541  NA 133* 134*  K 3.9 3.8  CL 100 102  CO2 26 23  GLUCOSE 282* 189*  BUN 14 15  CREATININE 0.73 0.86  CALCIUM 8.3* 8.7*    Blood smear review: Pending  Pathology: None    Assessment and Plan: Kaitlin Holloway is a very nice 49 year old Afro-American female.  She has marked eosinophilia.  I have to believe this is to be some type of rheumatologic condition.  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  Unfortunately, there is no inpatient rheumatologic consultation in [place].  I would be surprised if she had primary hypereosinophilic syndrome.  This is was a possibility.  An underlying malignancy would be a little unlikely.  Typically Hodgkin's lymphoma is was mostly associated with eosinophilia.  It would be reasonable to get a CT of the abdomen and pelvis looking for splenomegaly or hepatomegaly and lymphadenopathy.  I am not sure of this rash needs to be biopsied.  I forgot to mention that she did have an MRI of the thoracic spine.  Looks like she may have spinal erector inflammation.  I would not think that she would have HIV or hepatitis.  Again I am not sure if COVID is a possibility as a late term manifestation.  Ultimately, she may need to have a bone marrow biopsy done to rule out hypereosinophilic syndrome.  Typically, this is not associated with arthralgias to the degree that Kaitlin Holloway has.  She is very nice.  I just feel bad for her.  We will follow along.  We will have to see what a blood smear looks like.  I know that the staff on [address] will do a fantastic job taking care of her.  Lattie Haw, MD [place] 12:12

## 2020-03-19 NOTE — Progress Notes (Addendum)
PROGRESS NOTE    Kaitlin Holloway  XKG:[id number] DOB: 1970/12/13 DOA: 03/15/2020 PCP: Mack Hook, MD    Chief Complaint  Patient presents with  . Joint Pain    Brief Narrative:  Ms. Golomb reported she thinks she contracted Covid March 2020, she is state was in perfect health condition doing regular exercise a few times a week prior to that.  However after March 2020 she started to have diffuse joint pain muscle aches, weakness, she has been taking ibuprofen 1-3 times per day for more than a year now, she noticed she is getting slower, and weaker she cannot make a fist due to stiffness and weakness, she will report proximal muscle weakness is harder for her to raise arms , harder for her to get from sitting position to stand up position . she presented to the hospital due to significant upper back pain to the point she cannot breath  Subjective:  Continue need fentanyl for  Pain Continue to be weak, difficult getting up from toilet No improvement  Assessment & Plan:   Principal Problem:   Dyspnea Active Problems:   Polyarthralgia   Hyperglycemia   Elevated CK   Myositis   Dyspnea ( presenting symptom), reported likely due to back pain Echocardiogram with A small pericardial effusion otherwise unremarkable  CTA unremarkable, no hypoxia, denies chest pain, troponin negative  Generalized body aches with polyarthralgia bilateral ankle swelling stiffness in hands and proximal muscle weakness, she has significant point tenderness upper thoracic spine  -MRI thoracic spine" 1. 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 Other paraspinal soft tissues are normal. No convincing acute osseous abnormality (benign left T5 transverse process bone hemangioma suspected).  2. Thoracic spinal cord appears normal. Heterogeneous signal in the capacious dorsal spinal canal is felt due to CSF pulsation  artifact.  3. Occasional small thoracic disc herniations but no spinal stenosis. Up to moderate thoracic posterior element degeneration with moderate bilateral T9 and T10 neural foraminal stenosis  -Venous Doppler bilateral lower extremity no DVT -Bilateral ankle x-ray mild degenerative changes no acute findings -She has several lab abnormalities including elevated CK level at 1000 range ,significant elevated rheumatoid factor greater than 650, elevated CCP at 176, mild elevated ESR and CRP, negative ANA, negative anti-Jo 1 - discussed with  [facility] who initially recommend transfer, however, [facility] is at capacity, no bed, rheumatology recommend consult neurology and hematology here while waiting for a bed, will call neurology and hematology, rheumatology recommend holding steroids until further work up  -bilateral hand and foot x ray ordered per rheumatology recommendation -case discussed with [facility] Dr Arelia Sneddon who will see patient in consult, consult request sent to neurology, neurology is currently in code stroke, will discuss case with him later -For now pain control and physical therapy  Skin rash bilateral groin -Trial of topical nystatin   Leukocytosis and eosinophilia -Unclear etiology ,she has no fever -Denies history of asthma -case discussed with [facility] Dr Arelia Sneddon who will see patient in consult,  Myositis/weakness -elevated ck, no h/o statin exposure, no alcohol use -case discussed with neurology who is to see patient in consult  Noninsulin-dependent type 2 diabetes, uncontrolled, with hyperglycemia -A1c 9.7, down from 12.5 in 2020 -Report history of metformin intolerance -Currently on SSI -she agrees to take glipizide at discharge -She reports symptom of peripheral neuropathy and eye issues, will discuss with her about Neurontin and ophthalmology follow-up  Recent history of cataract surgery, right eye Follow-up  with ophthalmology  Hypertension She  does not want to take lisinopril She agrees to try losartan     DVT prophylaxis: enoxaparin (LOVENOX) injection 40 mg Start: 03/17/20 1800 Place and maintain sequential compression device Start: 03/17/20 1714Lovenox   Code Status: Full Family Communication: Patient Disposition:   Status is: Inpatient  Dispo: The patient is from: Home              Anticipated d/c is to: To be determined              Anticipated d/c date is: To be determined                Consultants:   We will contact rheumatology  Procedures:   None  Antimicrobials:   None     Objective: Vitals:   03/18/20 1151 03/18/20 1800 03/18/20 2123 03/19/20 0428  BP: (!) 151/92 (!) 148/92 (!) 155/89 (!) 145/88  Pulse: 81 87 88 81  Resp: '18  16 20  ' Temp: 98 F (36.7 C)  98.9 F (37.2 C) 98 F (36.7 C)  TempSrc:   Oral Oral  SpO2: 99%  98% 100%  Weight:      Height:        Intake/Output Summary (Last 24 hours) at 03/19/2020 5537 Last data filed at 03/19/2020 0134 Gross per 24 hour  Intake 240 ml  Output 800 ml  Net -560 ml   Filed Weights   03/16/20 0539  Weight: 99.8 kg    Examination:  General exam: Weak ,calm, NAD Respiratory system: Clear to auscultation. Respiratory effort normal. Cardiovascular system: S1 & S2 heard, RRR.  Gastrointestinal system: Abdomen is nondistended, soft and nontender. No organomegaly or masses felt. Normal bowel sounds heard. Central nervous system: Alert and oriented. No focal neurological deficits. Extremities: Generalized weakness, not able to make a fist.  Tenderness PIP joint both hand, MCP joint enlargement, bilateral ankle edema Skin: Bilateral groin raised rash Psychiatry: Anxious    Data Reviewed: I have personally reviewed following labs and imaging studies  CBC: Recent Labs  Lab 03/16/20 0004 03/16/20 0810 03/17/20 0500 03/18/20 0541  WBC 19.2* 18.2* 18.2* 18.2*  NEUTROABS 4.9 3.7 3.1 3.6  HGB 13.8 13.3 13.1 13.9  HCT 38.9 38.1 37.1  39.8  MCV 84.9 87.2 86.7 86.9  PLT 214 187 208 482    Basic Metabolic Panel: Recent Labs  Lab 03/16/20 0004 03/16/20 0810 03/17/20 0500 03/18/20 0541  NA 135 134* 133* 134*  K 4.0 3.8 3.9 3.8  CL 100 102 100 102  CO2 '24 22 26 23  ' GLUCOSE 285* 237* 282* 189*  BUN '17 14 14 15  ' CREATININE 0.73 0.70 0.73 0.86  CALCIUM 8.8* 8.3* 8.3* 8.7*  MG  --   --  1.8  --     GFR: Estimated Creatinine Clearance: 106.9 mL/min (by C-G formula based on SCr of 0.86 mg/dL).  Liver Function Tests: Recent Labs  Lab 03/16/20 0004 03/16/20 0810  AST 38 29  ALT 44 34  ALKPHOS 107 82  BILITOT 0.9 0.7  PROT 6.6 5.6*  ALBUMIN 3.1* 2.8*    CBG: Recent Labs  Lab 03/18/20 0722 03/18/20 1149 03/18/20 1709 03/18/20 2121 03/19/20 0723  GLUCAP 184* 181* 230* 186* 207*     Recent Results (from the past 240 hour(s))  Resp Panel by RT-PCR (Flu A&B, Covid) Nasopharyngeal Swab     Status: None   Collection Time: 03/16/20 12:04 AM   Specimen: Nasopharyngeal Swab; Nasopharyngeal(NP) swabs in  vial transport medium  Result Value Ref Range Status   SARS Coronavirus 2 by RT PCR NEGATIVE NEGATIVE Final    Comment: (NOTE) SARS-CoV-2 target nucleic acids are NOT DETECTED.  The SARS-CoV-2 RNA is generally detectable in upper respiratory specimens during the acute phase of infection. The lowest concentration of SARS-CoV-2 viral copies this assay can detect is 138 copies/mL. A negative result does not preclude SARS-Cov-2 infection and should not be used as the sole basis for treatment or other patient management decisions. A negative result may occur with  improper specimen collection/handling, submission of specimen other than nasopharyngeal swab, presence of viral mutation(s) within the areas targeted by this assay, and inadequate number of viral copies(<138 copies/mL). A negative result must be combined with clinical observations, patient history, and epidemiological information. The expected  result is Negative.  Fact Sheet for Patients:  EntrepreneurPulse.com.au  Fact Sheet for Healthcare Providers:  IncredibleEmployment.be  This test is no t yet approved or cleared by the [place] FDA and  has been authorized for detection and/or diagnosis of SARS-CoV-2 by FDA under an Emergency Use Authorization (EUA). This EUA will remain  in effect (meaning this test can be used) for the duration of the COVID-19 declaration under Section 564(b)(1) of the Act, 21 U.S.C.section 360bbb-3(b)(1), unless the authorization is terminated  or revoked sooner.       Influenza A by PCR NEGATIVE NEGATIVE Final   Influenza B by PCR NEGATIVE NEGATIVE Final    Comment: (NOTE) The Xpert Xpress SARS-CoV-2/FLU/RSV plus assay is intended as an aid in the diagnosis of influenza from Nasopharyngeal swab specimens and should not be used as a sole basis for treatment. Nasal washings and aspirates are unacceptable for Xpert Xpress SARS-CoV-2/FLU/RSV testing.  Fact Sheet for Patients: EntrepreneurPulse.com.au  Fact Sheet for Healthcare Providers: IncredibleEmployment.be  This test is not yet approved or cleared by the [place] FDA and has been authorized for detection and/or diagnosis of SARS-CoV-2 by FDA under an Emergency Use Authorization (EUA). This EUA will remain in effect (meaning this test can be used) for the duration of the COVID-19 declaration under Section 564(b)(1) of the Act, 21 U.S.C. section 360bbb-3(b)(1), unless the authorization is terminated or revoked.  Performed at [facility], [place] [address], [place], [place] [address]   Culture, blood (routine x 2)     Status: None (Preliminary result)   Collection Time: 03/16/20  7:55 AM   Specimen: BLOOD  Result Value Ref Range Status   Specimen Description   Final    BLOOD LEFT ANTECUBITAL Performed at [facility] [address], [place], [place] [address]    Special Requests   Final    BOTTLES DRAWN AEROBIC ONLY Blood Culture adequate volume Performed at [facility] [address] [place], [place] [address]    Culture   Final    NO GROWTH 3 DAYS Performed at [facility], [place] [address], [place], [place] [address]    Report Status PENDING  Incomplete  Culture, blood (routine x 2)     Status: None (Preliminary result)   Collection Time: 03/16/20  8:10 AM   Specimen: BLOOD  Result Value Ref Range Status   Specimen Description   Final    BLOOD LEFT ANTECUBITAL Performed at [facility] [address] [place],  [address]    Special Requests   Final    BOTTLES DRAWN AEROBIC ONLY Blood Culture results may not be optimal  due to an inadequate volume of blood received in culture bottles Performed at [facility] [address] [place], [place] [address]    Culture   Final    NO GROWTH 3 DAYS Performed at [facility], [place] [address], [place], [place] [address]    Report Status PENDING  Incomplete         Radiology Studies: No results found.      Scheduled Meds: . enoxaparin (LOVENOX) injection  40 mg Subcutaneous Q24H  . insulin aspart  0-9 Units Subcutaneous TID WC  . insulin aspart  3 Units Subcutaneous TID WC  . losartan  25 mg Oral Daily  . nystatin   Topical BID  . senna-docusate  1 tablet Oral BID   Continuous Infusions:   LOS: 2 days   Time spent: 6mns Greater than 50% of this time was spent in counseling, explanation of diagnosis, planning of further management, and coordination of care.  I have personally reviewed and interpreted on  03/19/2020 daily labs, tele strips, imagings as discussed above under date review session and assessment and plans.  I reviewed all nursing notes, pharmacy notes,  vitals, pertinent old records  I have discussed plan of care as described above with RN  , patient  on 03/19/2020  Voice Recognition /Dragon dictation system was used to create this note, attempts have been made to correct errors. Please contact the author with questions and/or clarifications.   FFlorencia Reasons MD PhD FACP [facility]  Available via Epic secure chat 7am-7pm for nonurgent issues Please page for urgent issues To page the attending provider between 7A-7P or the covering provider during after hours 7P-7A, please log into the web site [email or web address] and access using universal [place] password for that web site. If you do not have the password, please call the hospital operator.    03/19/2020, 9:22 AM

## 2020-03-20 DIAGNOSIS — R748 Abnormal levels of other serum enzymes: Secondary | ICD-10-CM

## 2020-03-20 LAB — GLUCOSE, CAPILLARY
Glucose-Capillary: 215 mg/dL — ABNORMAL HIGH (ref 70–99)
Glucose-Capillary: 164 mg/dL — ABNORMAL HIGH (ref 70–99)
Glucose-Capillary: 239 mg/dL — ABNORMAL HIGH (ref 70–99)

## 2020-03-20 LAB — LACTATE DEHYDROGENASE: LDH: 385 U/L — ABNORMAL HIGH (ref 98–192)

## 2020-03-20 LAB — RPR: RPR Ser Ql: NONREACTIVE

## 2020-03-20 NOTE — Progress Notes (Addendum)
PROGRESS NOTE    Kaitlin Holloway  GUR:[id number] DOB: September 27, 1970 DOA: 03/15/2020 PCP: Mack Hook, MD    Chief Complaint  Patient presents with   Joint Pain    Brief Narrative:  Kaitlin Holloway reported she thinks she contracted Covid March 2020, she is state was in perfect health condition doing regular exercise a few times a week prior to that.  However after March 2020 she started to have diffuse joint pain muscle aches, weakness, she has been taking ibuprofen 1-3 times per day for more than a year now, she noticed she is getting slower, and weaker she cannot make a fist due to stiffness and weakness, she will report proximal muscle weakness is harder for her to raise arms , harder for her to get from sitting position to stand up position . she presented to the hospital due to significant upper back pain to the point she cannot breath  Subjective:  Pain between shoulders is better down from 10 to 2 Weakness remains the same, difficult getting out of bed and getting  up from toilet  Bilateral Ankle edema has improved, report right knee slightly swollen, no knee pain at rest, dose has mild knee pain when bear weight   Assessment & Plan:   Principal Problem:   Dyspnea Active Problems:   Polyarthralgia   Hyperglycemia   Elevated CK   Myositis     Generalized body aches with polyarthralgia, bilateral ankle swelling, stiffness in hands and proximal muscle weakness,  she has significant point tenderness upper thoracic spine  -MRI thoracic spine" 1. 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 Other paraspinal soft tissues are normal. No convincing acute osseous abnormality (benign left T5 transverse process bone hemangioma suspected).  2. Thoracic spinal cord appears normal. Heterogeneous signal in the capacious dorsal spinal canal is felt due to CSF pulsation artifact.  3. Occasional small  thoracic disc herniations but no spinal stenosis. Up to moderate thoracic posterior element degeneration with moderate bilateral T9 and T10 neural foraminal stenosis  -Venous Doppler bilateral lower extremity no DVT -Bilateral ankle x-ray mild degenerative changes no acute findings -She has several lab abnormalities including elevated CK level at 1000 range ,significant elevated rheumatoid factor greater than 650, elevated CCP at 176, mild elevated ESR and CRP, negative ANA, negative anti-Jo 1 - discussed with  [facility] who initially recommend transfer, however, [facility] is at capacity, no bed, rheumatology recommend consult neurology and hematology here -rheumatology recommend holding steroids until further work up  -bilateral hand and foot x ray ordered per rheumatology recommendation which showed no evidence of arthropathy -hemonc Dr Arelia Sneddon input appreciated, will follow recommendations  - neurology consulted, will see patient today -For now pain control and physical therapy  Skin rash bilateral groin -Trial of topical nystatin , reports helped with the itch -may need skin biopsy  Leukocytosis and eosinophilia -Unclear etiology ,she has no fever, blood culture no growth, mri thoracic/lumber spine no infection -Denies history of asthma - CT ab showed splenomegaly, elevated LDH, will follow him on Dr. Marin Olp recommendation  splenomegaly  Per chart review she has a diagnosis of splenomegaly in 2015 with subcapsular hematoma of unclear etiology -she was told in 2015 "clonidine was the cause of subcapsular hematoma by a ED doctor" -will follow hemonc Dr Marin Olp recommendation   Myositis/weakness -elevated ck, no h/o statin exposure, no alcohol use -case discussed with neurology who is to see patient in consult   Dyspnea (  presenting symptom), reported likely due to back pain Echocardiogram with A small pericardial effusion otherwise unremarkable  CTA unremarkable,  no hypoxia, denies chest pain, troponin negative Appear resolved  SVT? Nonsustained, initially with activity Not sure if patient had tachycardia during dyspnea episode, denies dyspnea since in the hospital Tsh unremarkable Will keep mag>2, k >4 Started on lopressor   Noninsulin-dependent type 2 diabetes, uncontrolled, with hyperglycemia -A1c 9.7, down from 12.5 in 2020 -Report history of metformin intolerance -Currently on SSI -she agrees to take glipizide at discharge -She reports symptom of peripheral neuropathy and eye issues, will discuss with her about Neurontin and ophthalmology follow-up  Recent history of cataract surgery, right eye Follow-up with ophthalmology  Hypertension She does not want to take lisinopril She agrees to try losartan She was on clonidine in 2015, she was told clonidine caused her spleen to ruputure     DVT prophylaxis: enoxaparin (LOVENOX) injection 40 mg Start: 03/17/20 1800 Place and maintain sequential compression device Start: 03/17/20 1714Lovenox   Code Status: Full Family Communication: Patient Disposition:   Status is: Inpatient  Dispo: The patient is from: Home              Anticipated d/c is to: To be determined              Anticipated d/c date is: To be determined                Consultants:   [facility] rheumatology Dr Arcelia Jew ( consult note in epic under care everywhere)  Neurology  Hematology oncology  Procedures:   None  Antimicrobials:   None     Objective: Vitals:   03/18/20 2123 03/19/20 0428 03/19/20 2022 03/20/20 0605  BP: (!) 155/89 (!) 145/88 136/83 137/89  Pulse: 88 81 85 81  Resp: _0 Temp: 98.9 F (37.2 C) 98 F (36.7 C) 98.6 F (37 C) 97.7 F (36.5 C)  TempSrc: Oral Oral Oral Oral  SpO2: 98% 100% 98% 99%  Weight:      Height:        Intake/Output Summary (Last 24 hours) at 03/20/2020 0952 Last data filed at 03/19/2020 1100 Gross per 24 hour  Intake 240 ml  Output --  Net 240  ml   Filed Weights   03/16/20 0539  Weight: 99.8 kg    Examination:  General exam: Weak ,calm, NAD Respiratory system: Clear to auscultation. Respiratory effort normal. Cardiovascular system: S1 & S2 heard, RRR.  Gastrointestinal system: Abdomen is nondistended, soft and nontender. Normal bowel sounds heard. Central nervous system: Alert and oriented. No focal neurological deficits. Extremities: Generalized weakness, not able to make a fist.  Tenderness PIP joint both hand, MCP joint enlargement, bilateral ankle edema has much improved Skin: Bilateral groin raised rash Psychiatry: Anxious    Data Reviewed: I have personally reviewed following labs and imaging studies  CBC: Recent Labs  Lab 03/16/20 0004 03/16/20 0810 03/17/20 0500 03/18/20 0541  WBC 19.2* 18.2* 18.2* 18.2*  NEUTROABS 4.9 3.7 3.1 3.6  HGB 13.8 13.3 13.1 13.9  HCT 38.9 38.1 37.1 39.8  MCV 84.9 87.2 86.7 86.9  PLT 214 187 208 226    Basic Metabolic Panel: Recent Labs  Lab 03/16/20 0004 03/16/20 0810 03/17/20 0500 03/18/20 0541  NA 135 134* 133* 134*  K 4.0 3.8 3.9 3.8  CL 100 102 100 102  CO2 _1 GLUCOSE 285* 237* 282* 189*  BUN _2 15  CREATININE 0.73 0.70 0.73 0.86  CALCIUM 8.8* 8.3* 8.3* 8.7*  MG  --   --  1.8  --     GFR: Estimated Creatinine Clearance: 106.9 mL/min (by C-G formula based on SCr of 0.86 mg/dL).  Liver Function Tests: Recent Labs  Lab 03/16/20 0004 03/16/20 0810  AST 38 29  ALT 44 34  ALKPHOS 107 82  BILITOT 0.9 0.7  PROT 6.6 5.6*  ALBUMIN 3.1* 2.8*    CBG: Recent Labs  Lab 03/19/20 0723 03/19/20 1134 03/19/20 1655 03/19/20 2018 03/20/20 0744  GLUCAP 207* 184* 220* 223* 215*     Recent Results (from the past 240 hour(s))  Resp Panel by RT-PCR (Flu A&B, Covid) Nasopharyngeal Swab     Status: None   Collection Time: 03/16/20 12:04 AM   Specimen: Nasopharyngeal Swab; Nasopharyngeal(NP) swabs in vial transport medium  Result Value Ref  Range Status   SARS Coronavirus 2 by RT PCR NEGATIVE NEGATIVE Final    Comment: (NOTE) SARS-CoV-2 target nucleic acids are NOT DETECTED.  The SARS-CoV-2 RNA is generally detectable in upper respiratory specimens during the acute phase of infection. The lowest concentration of SARS-CoV-2 viral copies this assay can detect is 138 copies/mL. A negative result does not preclude SARS-Cov-2 infection and should not be used as the sole basis for treatment or other patient management decisions. A negative result may occur with  improper specimen collection/handling, submission of specimen other than nasopharyngeal swab, presence of viral mutation(s) within the areas targeted by this assay, and inadequate number of viral copies(<138 copies/mL). A negative result must be combined with clinical observations, patient history, and epidemiological information. The expected result is Negative.  Fact Sheet for Patients:  EntrepreneurPulse.com.au  Fact Sheet for Healthcare Providers:  IncredibleEmployment.be  This test is no t yet approved or cleared by the [place] FDA and  has been authorized for detection and/or diagnosis of SARS-CoV-2 by FDA under an Emergency Use Authorization (EUA). This EUA will remain  in effect (meaning this test can be used) for the duration of the COVID-19 declaration under Section 564(b)(1) of the Act, 21 U.S.C.section 360bbb-3(b)(1), unless the authorization is terminated  or revoked sooner.       Influenza A by PCR NEGATIVE NEGATIVE Final   Influenza B by PCR NEGATIVE NEGATIVE Final    Comment: (NOTE) The Xpert Xpress SARS-CoV-2/FLU/RSV plus assay is intended as an aid in the diagnosis of influenza from Nasopharyngeal swab specimens and should not be used as a sole basis for treatment. Nasal washings and aspirates are unacceptable for Xpert Xpress SARS-CoV-2/FLU/RSV testing.  Fact Sheet for  Patients: EntrepreneurPulse.com.au  Fact Sheet for Healthcare Providers: IncredibleEmployment.be  This test is not yet approved or cleared by the [place] FDA and has been authorized for detection and/or diagnosis of SARS-CoV-2 by FDA under an Emergency Use Authorization (EUA). This EUA will remain in effect (meaning this test can be used) for the duration of the COVID-19 declaration under Section 564(b)(1) of the Act, 21 U.S.C. section 360bbb-3(b)(1), unless the authorization is terminated or revoked.  Performed at [facility], [place] [address] [place],  [address]   Culture, blood (routine x 2)     Status: None (Preliminary result)   Collection Time: 03/16/20  7:55 AM   Specimen: BLOOD  Result Value Ref Range Status   Specimen Description   Final    BLOOD LEFT ANTECUBITAL Performed at [facility] [address] [place],  [address]  Special Requests   Final    BOTTLES DRAWN AEROBIC ONLY Blood Culture adequate volume Performed at [facility] [address] [place], [place] [address]    Culture   Final    NO GROWTH 4 DAYS Performed at [facility], [place] [address] [place], [place] [address]    Report Status PENDING  Incomplete  Culture, blood (routine x 2)     Status: None (Preliminary result)   Collection Time: 03/16/20  8:10 AM   Specimen: BLOOD  Result Value Ref Range Status   Specimen Description   Final    BLOOD LEFT ANTECUBITAL Performed at [facility] [address], [place], [place] [address]    Special Requests   Final    BOTTLES DRAWN AEROBIC ONLY Blood Culture results may not be optimal due to an inadequate volume of blood received in culture bottles Performed at [facility] [address] [place], [place] [address]    Culture   Final    NO GROWTH 4 DAYS Performed at [facility],  [place] [address], [place], [place] [address]    Report Status PENDING  Incomplete  Culture, blood (routine x 2)     Status: None (Preliminary result)   Collection Time: 03/19/20  4:39 PM   Specimen: BLOOD  Result Value Ref Range Status   Specimen Description   Final    BLOOD LEFT ANTECUBITAL Performed at [facility] [address], [place], [place] [address]    Special Requests   Final    BOTTLES DRAWN AEROBIC ONLY Blood Culture adequate volume Performed at [facility] [address], [place], [place] [address]    Culture   Final    NO GROWTH < 12 HOURS Performed at [facility] [address] [place], [place] [address]    Report Status PENDING  Incomplete  Culture, blood (routine x 2)     Status: None (Preliminary result)   Collection Time: 03/19/20  4:47 PM   Specimen: BLOOD  Result Value Ref Range Status   Specimen Description   Final    BLOOD LEFT ANTECUBITAL Performed at [facility] [address], [place], [place] [address]    Special Requests   Final    BOTTLES DRAWN AEROBIC ONLY Blood Culture adequate volume Performed at [facility] [address], [place], [place] [address]    Culture   Final    NO GROWTH < 12 HOURS Performed at [facility] [address] [place], [place] [address]    Report Status PENDING  Incomplete         Radiology Studies: MR Lumbar Spine W Wo Contrast  Result Date: 03/19/2020 CLINICAL DATA:  Low back pain. EXAM: MRI LUMBAR SPINE WITHOUT AND WITH CONTRAST TECHNIQUE: Multiplanar and multiecho pulse sequences of the lumbar spine were obtained without and with intravenous contrast. CONTRAST:  104m GADAVIST GADOBUTROL 1 MMOL/ML IV SOLN COMPARISON:  CT abdomen and pelvis 03/19/2020. Thoracic spine MRI 03/16/2020. FINDINGS: Segmentation:  Standard. Alignment:  Normal. Vertebrae: No fracture, suspicious osseous lesion, significant marrow edema, or  evidence of discitis. Chronic degenerative endplate changes at LE7-M0 No epidural fluid collection. Conus medullaris and cauda equina: Conus extends to the T12 level. Conus and cauda equina appear normal. Paraspinal and other soft tissues: No paraspinal fluid collection. No significant lumbar paraspinal muscle edema in comparison to the thoracic edema on the prior MRI.  1.2 cm left renal cyst. Disc levels: L1-2 and L1-2: Normal discs.  Mild facet arthrosis without stenosis. L3-4: Minimal disc bulging and mild facet arthrosis without stenosis. L4-5: Mild disc bulging and mild facet arthrosis without stenosis. L5-S1: Disc desiccation and moderate disc space narrowing. Disc bulging, endplate spurring, disc space height loss, and mild facet arthrosis result in mild right neural foraminal stenosis and mild left lateral recess stenosis without spinal stenosis. IMPRESSION: 1. No evidence of lumbar spine infection. 2. Lower lumbar disc and facet degeneration, greatest at L5-S1 where there is mild right neural foraminal and left lateral recess stenosis. Electronically Signed   By: Logan Bores M.D.   On: 03/19/2020 20:32   CT ABDOMEN PELVIS W CONTRAST  Result Date: 03/19/2020 CLINICAL DATA:  Diffuse joint pain with marked eosinophilia. Evaluate for splenomegaly, adenopathy or hepatomegaly. EXAM: CT ABDOMEN AND PELVIS WITH CONTRAST TECHNIQUE: Multidetector CT imaging of the abdomen and pelvis was performed using the standard protocol following bolus administration of intravenous contrast. CONTRAST:  125m OMNIPAQUE IOHEXOL 300 MG/ML  SOLN COMPARISON:  CT angio chest 03/16/2020 FINDINGS: Lower chest: No acute abnormality. Hepatobiliary: No focal liver abnormality is seen. The liver has an approximate volume of 2100 cc with a length of 19.58. No gallstones, gallbladder wall thickening, or biliary dilatation. Pancreas: Unremarkable. No pancreatic ductal dilatation or surrounding inflammatory changes. Spleen: This lean  measures 14.3 by 10.3 x 6.2 cm (volume = 480 cm^3) no focal liver lesion identified. Adrenals/Urinary Tract: Normal appearance of the adrenal glands. Small simple appearing cyst within upper pole of left kidney measures 1.2 cm, image 28/2. The urinary bladder appears normal. Stomach/Bowel: Stomach is nondistended. No bowel wall thickening, inflammation, or distension. Vascular/Lymphatic: Aortic atherosclerosis. No aneurysm. No abdominal adenopathy. Prominent bilateral pelvic lymph nodes are identified a few of which are borderline enlarged including left external iliac node measuring 1.1 cm, image 79/2. Right external iliac lymph node measures 1.0 cm. Reproductive: Status post hysterectomy. No adnexal masses. Other: No free fluid or fluid collections identified. Musculoskeletal: Similar to recent MR findings there is abnormal edema and soft tissue stranding surrounding the skeletal muscles compatible with generalized myositis. No aggressive lytic or sclerotic bone lesions identified. Disc space narrowing and endplate spurring is identified at L5-S1 along with vacuum disc phenomenon. However, the endplates appear irregular with loss of normal definition with increasing lucency. There is no surrounding increased soft tissue, soft tissue stranding or fluid collection. IMPRESSION: 1. Splenomegaly. 2. Abnormal edema and soft tissue stranding surrounding the skeletal muscles compatible with generalized myositis. 3. Disc space narrowing and endplate spurring at LP5-P0along with vacuum disc phenomenon. However, the endplates appear irregular with loss of normal definition and increasing lucency. No surrounding increased soft tissue, soft tissue stranding or fluid collection. Findings are favored to represent degenerative disc disease. However, given the elevated white blood cell count, ESR, and CRP early discitis cannot be excluded. If there are clinical signs or symptoms suggestive of discitis at L5-S1 consider further  investigation with contrast enhanced MRI of the lumbar spine. 4. Aortic atherosclerosis. Aortic Atherosclerosis (ICD10-I70.0). These results will be called to the ordering clinician or representative by the Radiologist Assistant, and communication documented in the PACS or [employer] Electronically Signed   By: TKerby MoorsM.D.   On: 03/19/2020 16:06   DG Hand 2 View Right  Result Date: 03/19/2020 CLINICAL DATA:  Pain EXAM: RIGHT HAND - 2 VIEW COMPARISON:  None. FINDINGS: Frontal and lateral views were obtained. No appreciable fracture or  dislocation. Joint spaces appear unremarkable. No erosive change or periostitis. Bony mineralization unremarkable. IMPRESSION: No fracture or dislocation.  No appreciable arthropathic change. Electronically Signed   By: Lowella Grip III M.D.   On: 03/19/2020 10:52   DG Hand 2 View Left  Result Date: 03/19/2020 CLINICAL DATA:  Pain EXAM: LEFT HAND - 2 VIEW COMPARISON:  None. FINDINGS: Frontal and lateral views were obtained. No fracture or dislocation. Joint spaces appear normal. No erosive change or periostitis. Bony mineralization normal. IMPRESSION: No fracture or dislocation.  No evident arthropathy. Electronically Signed   By: Lowella Grip III M.D.   On: 03/19/2020 10:56   DG Foot 2 Views Left  Result Date: 03/19/2020 CLINICAL DATA:  Bilateral foot pain since March 2020 EXAM: LEFT FOOT - 2 VIEW; RIGHT FOOT - 2 VIEW COMPARISON:  None. FINDINGS: There is no evidence of fracture or dislocation of the bilateral feet. Dorsal spurring/hypertrophy at the left talonavicular joint, likely reflects sequela of remote trauma. Overall, joint spaces of the bilateral feet are maintained. No bony erosion. No periostitis. Soft tissues are unremarkable. IMPRESSION: No acute osseous abnormality or significant arthropathy of the bilateral feet. Electronically Signed   By: Davina Poke D.O.   On: 03/19/2020 10:58   DG Foot 2 Views Right  Result Date:  03/19/2020 CLINICAL DATA:  Bilateral foot pain since March 2020 EXAM: LEFT FOOT - 2 VIEW; RIGHT FOOT - 2 VIEW COMPARISON:  None. FINDINGS: There is no evidence of fracture or dislocation of the bilateral feet. Dorsal spurring/hypertrophy at the left talonavicular joint, likely reflects sequela of remote trauma. Overall, joint spaces of the bilateral feet are maintained. No bony erosion. No periostitis. Soft tissues are unremarkable. IMPRESSION: No acute osseous abnormality or significant arthropathy of the bilateral feet. Electronically Signed   By: Davina Poke D.O.   On: 03/19/2020 10:58        Scheduled Meds:  enoxaparin (LOVENOX) injection  40 mg Subcutaneous Q24H   insulin aspart  0-9 Units Subcutaneous TID WC   insulin aspart  3 Units Subcutaneous TID WC   losartan  25 mg Oral Daily   melatonin  3 mg Oral QHS   metoprolol tartrate  12.5 mg Oral BID   nystatin   Topical BID   senna-docusate  1 tablet Oral BID   Continuous Infusions:   LOS: 3 days   Time spent: 62mns Greater than 50% of this time was spent in counseling, explanation of diagnosis, planning of further management, and coordination of care.  I have personally reviewed and interpreted on  03/20/2020 daily labs, tele strips, imagings as discussed above under date review session and assessment and plans.  I reviewed all nursing notes, pharmacy notes,  vitals, pertinent old records  I have discussed plan of care as described above with RN , patient  on 03/20/2020  Voice Recognition /Dragon dictation system was used to create this note, attempts have been made to correct errors. Please contact the author with questions and/or clarifications.   FFlorencia Reasons MD PhD FACP [facility]  Available via Epic secure chat 7am-7pm for nonurgent issues Please page for urgent issues To page the attending provider between 7A-7P or the covering provider during after hours 7P-7A, please log into the web site  [email or web address] and access using universal [place] password for that web site. If you do not have the password, please call the hospital operator.    03/20/2020, 9:52 AM

## 2020-03-20 NOTE — Consult Note (Signed)
Neurology Consultation Reason for Consult: c/f Myositis  Referring Physician: Florencia Reasons, MD  CC: My body has not been right since [place] in March 2020  History is obtained from: Patient and chart review   HPI: Kaitlin Holloway is a 49 y.o. female with a past medical history significant for type 2 diabetes (nonadherence to medications due to mistrust of the medical system and medication side effects), upper respiratory infection March 2020 with concerns that it might of been COVID-19.  She reports that in March 2020 she had an upper respiratory infection that was associated with a "heat rash type of rash" on her chest (small red bumps) lasting about 1-1/2 weeks as well as bumps on her tongue that lasted about 1-1/2 days.  She treated these with rubbing alcohol and Listerine/hydrogen peroxide respectively.  After that she had gradually progressive joint pain and stiffness that is worse in the morning and improves but does not resolve over the course of the day.  She takes ibuprofen 400 to 600 mg 2-3 times daily which improves but does not resolve her pain.  The majority of her pain is in her shoulders/elbows/wrists.  In February 2021 she sustained an injury to her right knee which is further increased her pain everywhere as she has to favor that right knee.  She has noticed proximal muscle weakness with difficulty reaching above her head and difficulty rising out of chairs/climbing stairs in particular.  On review of systems she additionally reports some toenail fungus which she feels spread to her thighs in the last few weeks.  She has also had some mild feeling of her heart fluttering twice a month and constipation.   She has managed her diabetes primarily with yoga and dietary changes due to mistrust of the medical system.  For example she reports she was given "green cleanser" of some sort that looks like the color of spinach and tasted very sweet when she was initially diagnosed with prediabetes.  On  her return to that 79 office she was then told she had diabetes.  She tried Metformin and glipizide for about 3 weeks but felt that they were just making her feel worse and so she stopped taking those.  She has made significant improvement in her A1c with diet and lifestyle changes as documented below  In addition to ibuprofen she is using boswellia and valerian; she is also using apple cider vinegar for her blood pressure  She feels like the initial shortness of breath she presented with a secondary to the severe pain she was in.  This has improved, though she continues to be in a significant amount of pain.  ROS: A 14 point ROS was performed and is negative except as noted in the HPI.   Past Medical History:  Diagnosis Date  . Arthritis    knee  . COVID-19 06/2018   Residual joint pain  . Diabetes mellitus without complication (HCC)    Type 2, does not take her meds.  . Hypertension 2002  . Joint pain    since having COVID 06/2018  . Motion sickness    boats, car - back seat  . Prediabetes 2016   Past Surgical History:  Procedure Laterality Date  . ABDOMINAL HYSTERECTOMY  2016   unilateral oophorectomy.  Does not know what side.  For fibroids and heavy bleeding.  . APPENDECTOMY     age 21  . CATARACT EXTRACTION W/PHACO Right 01/28/2020   Procedure: CATARACT EXTRACTION PHACO AND INTRAOCULAR LENS PLACEMENT (IOC)  RIGHT VISION BLUE 13.73  01:10.8;  Surgeon: Marchia Meiers, MD;  Location: [facility];  Service: Ophthalmology;  Laterality: Right;  Diabetes Latex  . TONSILLECTOMY      Current Outpatient Medications  Medication Instructions  . glipiZIDE (GLUCOTROL) 5 MG tablet 1 tab by mouth twice daily with meals  . ibuprofen (ADVIL) 400 mg, Oral, Every 6 hours PRN  . lisinopril (ZESTRIL) 10 mg, Oral, Daily  . metFORMIN (GLUCOPHAGE-XR) 500 MG 24 hr tablet 1 tab by mouth twice daily with meals  Please note supplements she is taking as in HPI above   Family History   Problem Relation Age of Onset  . Heart disease Mother 81       AMI  . Diabetes Mother   . Hypertension Mother   . Hyperlipidemia Mother   She specifically denies any family history of rheumatological diseases, multiple sclerosis, seizures,  Social History:  reports that she quit smoking about 19 years ago. Her smoking use included cigarettes. She has a 25.50 pack-year smoking history. She has never used smokeless tobacco. She reports that she does not drink alcohol and does not use drugs.   Exam: Current vital signs: BP 137/89 (BP Location: Right Arm)   Pulse 81   Temp 97.7 F (36.5 C) (Oral)   Resp 18   Ht 6' 2" (1.88 m)   Wt 99.8 kg   SpO2 99%   BMI 28.25 kg/m  Vital signs in last 24 hours: Temp:  [97.7 F (36.5 C)-98.6 F (37 C)] 97.7 F (36.5 C) (11/28 0605) Pulse Rate:  [81-85] 81 (11/28 0605) Resp:  [18] 18 (11/28 0605) BP: (136-137)/(83-89) 137/89 (11/28 0605) SpO2:  [98 %-99 %] 99 % (11/28 2595)   Physical Exam  Constitutional: Appears well-developed and well-nourished.  Psych: Affect appropriate to situation, pleasant. Emphatic about her mistrust of the medical system.  Tearful about the amount of pain she lives with on a daily basis and and expresses passive death wish Eyes: No scleral injection HENT: No OP obstruction  MSK: no joint deformities.  Cardiovascular: Normal rate and regular rhythm.  Respiratory: Effort normal, non-labored breathing GI: Soft.  No distension. There is no tenderness.  Skin: Violaceous rash on her inner thighs that does not appear to be blanching or raised Back: Nontender to palpation throughout  Neuro: Mental Status: Patient is awake, alert, oriented to person, place, month, year, and situation. Patient is able to give a clear and coherent history. No signs of aphasia or neglect Cranial Nerves: II: Visual Fields are full. Pupils are equal, round, and reactive to light.   III,IV, VI: EOMI without ptosis or diploplia.  V:  Facial sensation is symmetric to temperature VII: Facial movement is symmetric.  VIII: hearing is intact to voice X: Uvula elevates symmetrically XI: Shoulder shrug is symmetric. XII: tongue is midline without atrophy or fasciculations.  Motor: Tone is notable for guarding secondary to pain but otherwise appears normal. Bulk is notable for some mild muscle wasting.  Her examination is severely pain limited, proximally more than distally.  That said she is at least 4 out of 5 distally throughout, 3 out of 5 proximally throughout arms and legs.  Her muscles are nontender to palpation Sensory: She has a length dependent loss of temperature sensation in the arms and legs.  No extinction to double simultaneous stimulation Deep Tendon Reflexes: 2+ and symmetric in the biceps and patellae.  Plantars Toes are downgoing bilaterally.  Cerebellar: Finger-to-nose is intact bilaterally Gait: Her  casual gait is slightly wide-based with short steps, but steady  I have reviewed labs in epic and the results pertinent to this consultation are:  A1c 12.5% 08/2018 --> 9.7% 02/2020  Results for Kaitlin Holloway, Kaitlin Holloway (MRN [id number]) as of 03/20/2020 14:07  Ref. Range 03/16/2020 08:10  Anti Nuclear Antibody (ANA) Latest Ref Range: Negative  Negative  Anti JO-1 Latest Ref Range: 0.0 - 0.9 AI <0.2  CCP Antibodies IgG/IgA Latest Ref Range: 0 - 19 units 176 (H)  RA Latex Turbid. Latest Ref Range: <14.0 IU/mL >650.0 (H)    Lab Results  Component Value Date   ESRSEDRATE 28 (H) 03/16/2020    Lab Results  Component Value Date   CRP 3.5 (H) 03/16/2020   Lab Results  Component Value Date   TSH 2.694 03/16/2020   Lab Results  Component Value Date   CHOL 162 03/18/2020   HDL 34 (L) 03/18/2020   LDLCALC 104 (H) 03/18/2020   TRIG 122 03/18/2020   CHOLHDL 4.8 03/18/2020    No results found for: VITAMINB12   CBC  18.2 WBC 12.2% eosionphils  Smear review pending  LDH 385 (ref 98-192)  2.8 albumin 5.6  total protein --> 2.8 gamma gap  RPR negative 11/27 HIV negative 11/24  CK 1219 on admission, downtrending to 360 11/26  Imaging: 11/24: CT chest -- negative for infection and PE 11/27: CT Ab/pelv: IMPRESSION: 1. Splenomegaly. 2. Abnormal edema and soft tissue stranding surrounding the skeletal muscles compatible with generalized myositis. 3. Disc space narrowing and endplate spurring at H8-N2 along with vacuum disc phenomenon. However, the endplates appear irregular with loss of normal definition and increasing lucency. No surrounding increased soft tissue, soft tissue stranding or fluid collection. Findings are favored to represent degenerative disc disease. However, given the elevated white blood cell count, ESR, and CRP early discitis cannot be excluded. If there are clinical signs or symptoms suggestive of discitis at L5-S1 consider further investigation with contrast enhanced MRI of the lumbar spine. 4. Aortic atherosclerosis.  Personally reviewed MRI t-spine and l-spine, agree with radiology: T-spine: 1. Splenomegaly. 2. Abnormal edema and soft tissue stranding surrounding the skeletal muscles compatible with generalized myositis. 3. Disc space narrowing and endplate spurring at D7-O2 along with vacuum disc phenomenon. However, the endplates appear irregular with loss of normal definition and increasing lucency. No surrounding increased soft tissue, soft tissue stranding or fluid collection. Findings are favored to represent degenerative disc disease. However, given the elevated white blood cell count, ESR, and CRP early discitis cannot be excluded. If there are clinical signs or symptoms suggestive of discitis at L5-S1 consider further investigation with contrast enhanced MRI of the lumbar spine. 4. Aortic atherosclerosis. L-spine 1. No evidence of lumbar spine infection. 2. Lower lumbar disc and facet degeneration, greatest at L5-S1 where there is mild right neural  foraminal and left lateral recess stenosis.  Impression: This is a 49 year old woman with an autoimmune process affecting her joints.  She also has eosinophilia and elevated LDH as well as splenomegaly for which we appreciate hematology/oncology following.  Her primary issue appears to be joint pain rather than muscle weakness.  Notably her CK elevation on admission has improved with simple supportive care.  This argues against an ongoing severe autoimmune primary myositis requiring immediate immunosuppressive treatment, also argues against HmgCoA associated myositis. 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 Anti-CCP antibodies,  are not typically associated with myositis, so will obtain a broader antibody panel, but will hold on Hmg-CoA antibody testing at this time. Antibody test results may help guide outpatient management in the future, and in particular selection of a steroid-sparing disease modifying treatment.    Regarding treatment hesitate to start steroids until oncological work-up is complete as steroid exposure can partially treat some malignancies such as lymphoma and cloud to diagnostic work-up.  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 However, will also check SPEP/UPE/IFe and B12 to eval for other treatable causes of neuropathy (though these are less likely given normal gamma gap on CMP and lack of anemia on CBC)  Recommendations:  - [facility] Myositis panel (ordered), will need to be followed up outpatient - Aldolase  - SPEP/UPEP/IFe and B12 to complete neuropathy workup (though most likely etiology is diabetes) - EMG/NCS will need to be completed on an outpatient basis (not available inpatient); ordered ambulatory referral to neurology - Continued  counseling on the importance of diabetes control - Defer decision to potentially biopsy rash to hematology/oncology - Appreciate social work team's assistance with access to care/insurance issues - Appreciate oncology following and directing oncological workup - Appreciate plan for transfer for rheumatological evaluation  - Further evaluation of passive death wish by primary team - Neurology will be available on an as-needed basis and should be contacted if patient's motor status is significantly declining and transfer for rheumatologic care remains delayed.   Lesleigh Noe MD-PhD [facility] [phone]

## 2020-03-20 NOTE — Progress Notes (Signed)
Pt transferred to 1612 via wc accompanied by NT.

## 2020-03-20 NOTE — Progress Notes (Signed)
Report called and given to receiving nurse. Spoke with Alvino Chapel, RN on 6 east.

## 2020-03-20 NOTE — Progress Notes (Signed)
Occupational Therapy Treatment Patient Details Name: Kaitlin Holloway MRN: [id number] DOB: [birth year] Today's Date: 03/20/2020    History of present illness 49 y.o. female with PMH of COVID 19, DM, HTN, and chronic polyarthralgia presents to the emergency department with continued joint pain but new onset difficulty breathing. Pt stated joint pain started after likely covid infection in March 2020.   OT comments  Treatment focused on educating patient on ROM exercises to perform of shoulders and fingers in order to maintain joint mobility and ROM. Patient educated on joint protection principles  - limiting activities to ROM and light activity to reduce rebounding pain. Patient verbalized understanding and demonstrated ROM activities instructed on - including single digit ROM, in hand manipulation tasks with paper towels, and ROM of shoulders while supine in bed. Possible impingement of shoulders due to internal rotation position of shoulders with compensatory movement - educated patient on external rotation ROM and scapular retraction to counterbalance internal rotation positioning and reduce pain. Patient demonstrated ability to perform bed mobility and sit to stand with increased time, use of bed rails and bed positioning to assist. Patient reports being able to perform toileting and grooming this morning- though needing an hour to perform due to joint pain, joint stiffness and fatigue. Cont POC.   Follow Up Recommendations  Outpatient OT    Equipment Recommendations  Tub/shower bench    Recommendations for Other Services      Precautions / Restrictions Precautions Precautions: None Precaution Comments: pt denies falls in past 1 year Restrictions Weight Bearing Restrictions: No       Mobility Bed Mobility Overal bed mobility: Modified Independent             General bed mobility comments: HOB up, used rail, increased time  Transfers   Equipment used: None              General transfer comment: Patient reports ambulating to bathroom without assistance. Patient performed bed mobility and sit to stand from elevated bed height for therapist to monitor.    Balance Overall balance assessment: No apparent balance deficits (not formally assessed)                                         ADL either performed or assessed with clinical judgement   ADL                                               Vision Patient Visual Report: No change from baseline     Perception     Praxis      Cognition Arousal/Alertness: Awake/alert Behavior During Therapy: WFL for tasks assessed/performed Overall Cognitive Status: Within Functional Limits for tasks assessed                                          Exercises Other Exercises Other Exercises: Educated patient on gentle ROM exercises to perfrom in hands and shoulder in order to maintain joint mobility and limit joint stiffness/ tendon shortening Other Exercises: Education in regards to joint protection in the setting of inflammation response   Shoulder Instructions       General Comments  Pertinent Vitals/ Pain       Pain Assessment: 0-10 Pain Score: 7  Pain Location: shoulders, elbows, wrists, knees, ankles Pain Descriptors / Indicators: Grimacing;Guarding Pain Intervention(s): Premedicated before session  Home Living                                          Prior Functioning/Environment              Frequency  Min 3X/week        Progress Toward Goals  OT Goals(current goals can now be found in the care plan section)  Progress towards OT goals: Progressing toward goals  Acute Rehab OT Goals Patient Stated Goal: decrease pain, get stronger, get back to yoga and dancing OT Goal Formulation: With patient Time For Goal Achievement: 03/31/20 Potential to Achieve Goals: Good  Plan Discharge plan remains appropriate     Co-evaluation                 AM-PAC OT "6 Clicks" Daily Activity     Outcome Measure   Help from another person eating meals?: A Little Help from another person taking care of personal grooming?: A Little Help from another person toileting, which includes using toliet, bedpan, or urinal?: A Little Help from another person bathing (including washing, rinsing, drying)?: A Lot Help from another person to put on and taking off regular upper body clothing?: A Little Help from another person to put on and taking off regular lower body clothing?: A Lot 6 Click Score: 16    End of Session    OT Visit Diagnosis: Muscle weakness (generalized) (M62.81)   Activity Tolerance Patient tolerated treatment well   Patient Left in bed;with call bell/phone within reach   Nurse Communication Mobility status        Time: 1023-1101 OT Time Calculation (min): 38 min  Charges: OT General Charges $OT Visit: 1 Visit OT Treatments $Therapeutic Activity: 8-22 mins $Therapeutic Exercise: 23-37 mins  Aariz Maish, OTR/L Acute Care Rehab Services  Office [phone] Pager: [phone]    Kaitlin Holloway 03/20/2020, 12:45 PM

## 2020-03-20 NOTE — Progress Notes (Signed)
I looked at her blood smear yesterday.  The eosinophils appear to be mature.  I do not see any type of blasts.  The white blood cells with neutrophils all looked okay.  There may have been some reactive changes.  There is no nucleated red blood cells.  CT scan does show mild splenomegaly.  I would have to think that she also has some hepatomegaly.  I suppose this might go along with an underlying hematologic issue.  She could also have Felty's syndrome which is splenomegaly in the face of rheumatoid arthritis.  Unfortunately, I think that we are going to have to do a bone marrow test on her.  With the bone marrow biopsy, we can send off for molecular markers which might indicate an underlying hypereosinophilic syndrome.  I would think that it is going to be difficult to use steroids on her because of her underlying diabetes.  She really needs to have good diabetic control.  There are no labs back today.  We will have to talk to her in the morning about the bone marrow biopsy.  I think she might be a little reluctant to undergo this.  However, I do think is going to be important to help [place] in management.   Lattie Haw, MD  Proverbs 17:17

## 2020-03-21 ENCOUNTER — Inpatient Hospital Stay (HOSPITAL_COMMUNITY): Payer: Self-pay

## 2020-03-21 LAB — GLUCOSE, CAPILLARY
Glucose-Capillary: 283 mg/dL — ABNORMAL HIGH (ref 70–99)
Glucose-Capillary: 217 mg/dL — ABNORMAL HIGH (ref 70–99)
Glucose-Capillary: 193 mg/dL — ABNORMAL HIGH (ref 70–99)
Glucose-Capillary: 211 mg/dL — ABNORMAL HIGH (ref 70–99)
Glucose-Capillary: 203 mg/dL — ABNORMAL HIGH (ref 70–99)

## 2020-03-21 LAB — COMPREHENSIVE METABOLIC PANEL
ALT: 22 U/L (ref 0–44)
AST: 21 U/L (ref 15–41)
Albumin: 3.2 g/dL — ABNORMAL LOW (ref 3.5–5.0)
Alkaline Phosphatase: 75 U/L (ref 38–126)
Anion gap: 10 (ref 5–15)
BUN: 19 mg/dL (ref 6–20)
CO2: 24 mmol/L (ref 22–32)
Calcium: 8.9 mg/dL (ref 8.9–10.3)
Chloride: 101 mmol/L (ref 98–111)
Creatinine, Ser: 0.77 mg/dL (ref 0.44–1.00)
GFR, Estimated: >60 mL/min (ref >60)
Glucose, Bld: 225 mg/dL — ABNORMAL HIGH (ref 70–99)
Potassium: 4.1 mmol/L (ref 3.5–5.1)
Sodium: 135 mmol/L (ref 135–145)
Total Bilirubin: 0.6 mg/dL (ref 0.3–1.2)
Total Protein: 6.5 g/dL (ref 6.5–8.1)

## 2020-03-21 LAB — CBC WITH DIFFERENTIAL/PLATELET
Abs Immature Granulocytes: 0.03 10*3/uL (ref 0.00–0.07)
Basophils Absolute: 0 10*3/uL (ref 0.0–0.1)
Basophils Relative: 0 %
Eosinophils Absolute: 13 10*3/uL — ABNORMAL HIGH (ref 0.0–0.5)
Eosinophils Relative: 70 %
HCT: 39.4 % (ref 36.0–46.0)
Hemoglobin: 14 g/dL (ref 12.0–15.0)
Immature Granulocytes: 0 %
Lymphocytes Relative: 9 %
Lymphs Abs: 1.7 10*3/uL (ref 0.7–4.0)
MCH: 30.8 pg (ref 26.0–34.0)
MCHC: 35.5 g/dL (ref 30.0–36.0)
MCV: 86.6 fL (ref 80.0–100.0)
Monocytes Absolute: 0.5 10*3/uL (ref 0.1–1.0)
Monocytes Relative: 3 %
Neutro Abs: 3.4 10*3/uL (ref 1.7–7.7)
Neutrophils Relative %: 18 %
Platelets: 213 10*3/uL (ref 150–400)
RBC: 4.55 MIL/uL (ref 3.87–5.11)
RDW: 13.4 % (ref 11.5–15.5)
WBC: 18.7 10*3/uL — ABNORMAL HIGH (ref 4.0–10.5)
nRBC: 0 % (ref 0.0–0.2)

## 2020-03-21 LAB — LACTATE DEHYDROGENASE: LDH: 360 U/L — ABNORMAL HIGH (ref 98–192)

## 2020-03-21 LAB — CULTURE, BLOOD (ROUTINE X 2)
Culture: NO GROWTH
Culture: NO GROWTH
Special Requests: ADEQUATE

## 2020-03-21 LAB — VITAMIN B12: Vitamin B-12: 473 pg/mL (ref 180–914)

## 2020-03-21 LAB — C-REACTIVE PROTEIN: CRP: 2.7 mg/dL — ABNORMAL HIGH (ref <1.0)

## 2020-03-21 LAB — SEDIMENTATION RATE: Sed Rate: 35 mm/hr — ABNORMAL HIGH (ref 0–22)

## 2020-03-21 LAB — CK: Total CK: 428 U/L — ABNORMAL HIGH (ref 38–234)

## 2020-03-21 IMAGING — CT CT BIOPSY AND ASPIRATION BONE MARROW
1 of 2 series · 15 of 29 positions shown, 19 images · non-contrast
Comparison: none

INDICATION: Eosinophilia of uncertain etiology. Please perform CT-guided bone
marrow biopsy tissue diagnostic purposes, including cytogenetics.

[Series 2: i-spiral 5.0 br40 · axial · 0.98mm/px · z∈[+1134,+1256]mm · 15 of 39 slices shown, 19 images]
[im 2/39  mediastinal]
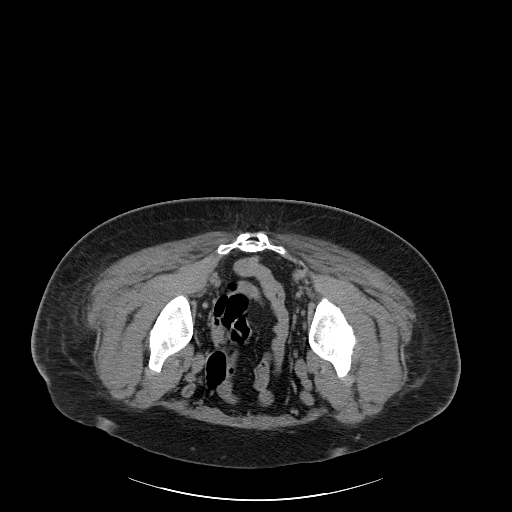
[im 2/39  lung]
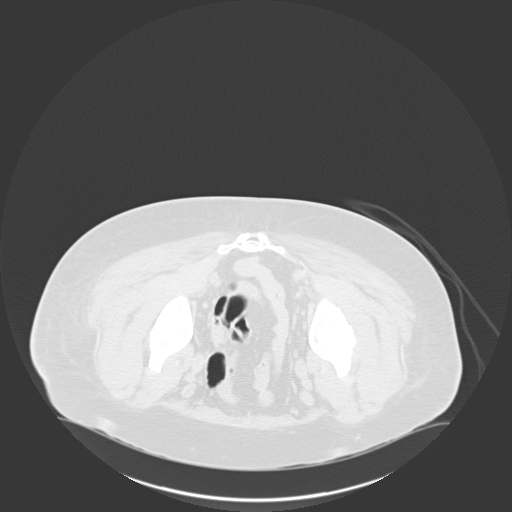
[im 5/39  lung]
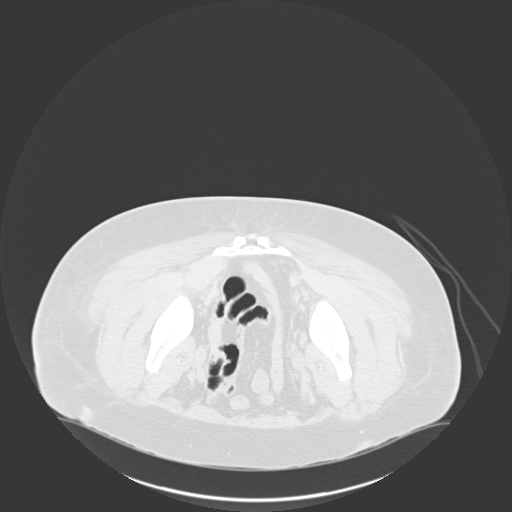
[im 7/39  lung]
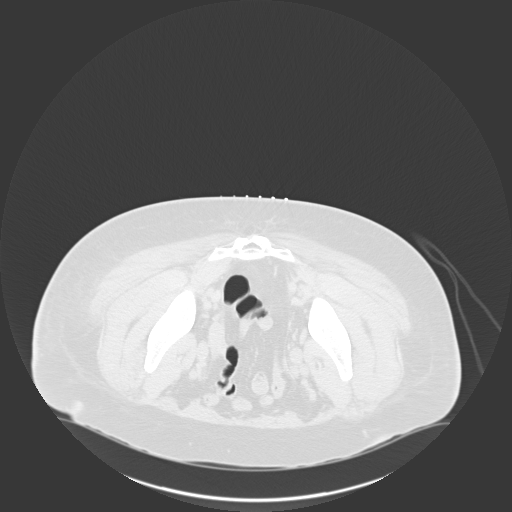
[im 10/39  lung]
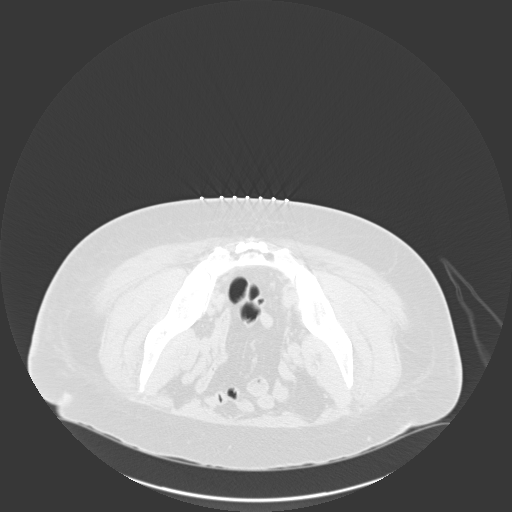
[im 12/39  mediastinal]
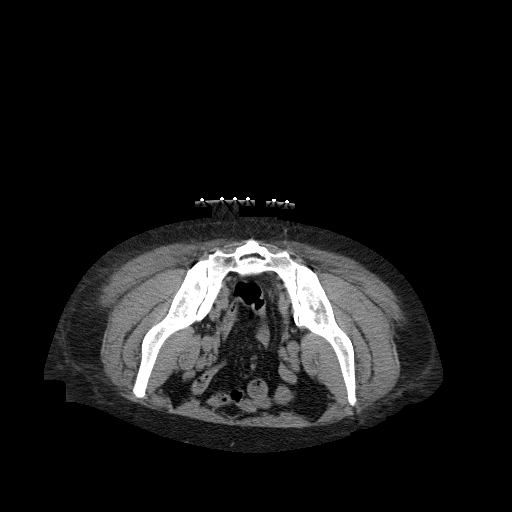
[im 12/39  lung]
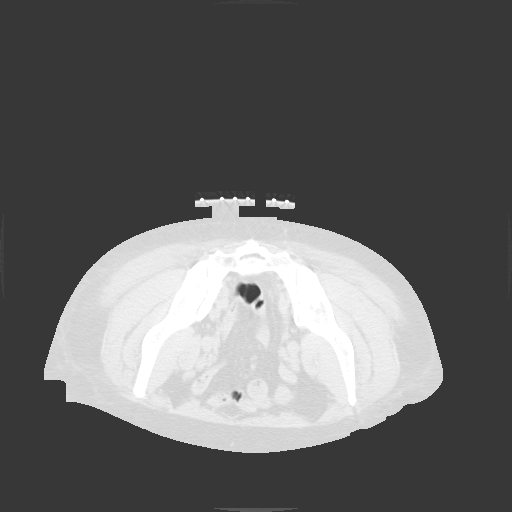
[im 15/39  lung]
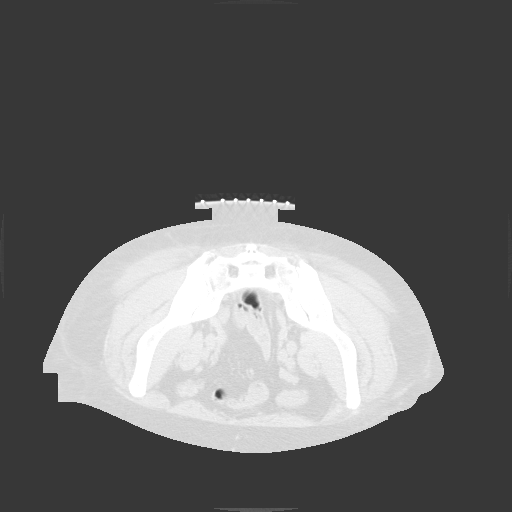
[im 17/39  lung]
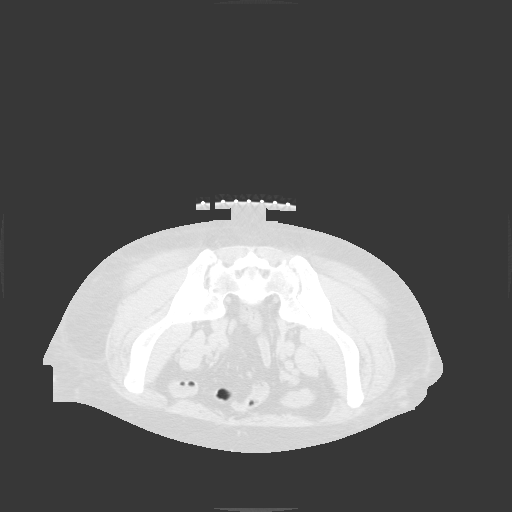
[im 19/39  lung]
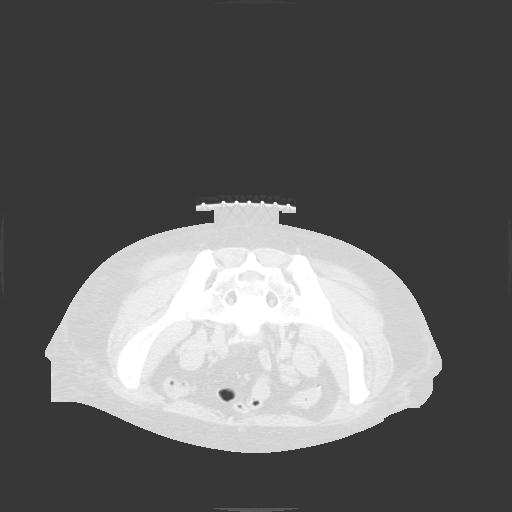
[im 22/39  mediastinal]
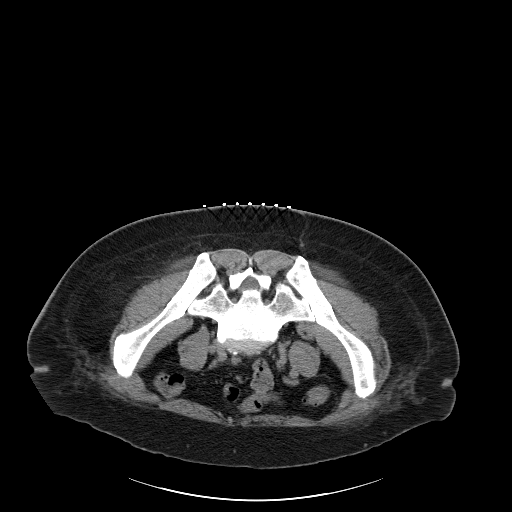
[im 22/39  lung]
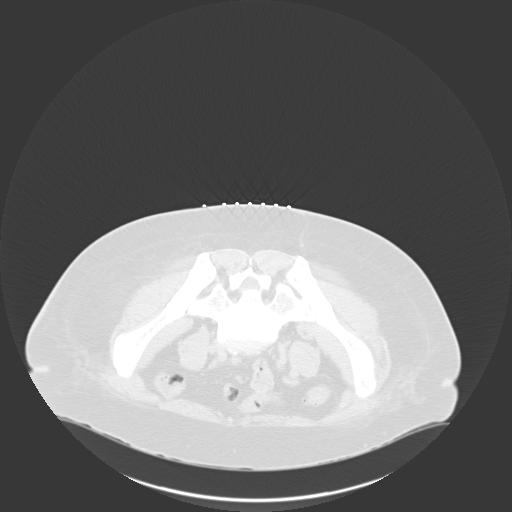
[im 24/39  lung]
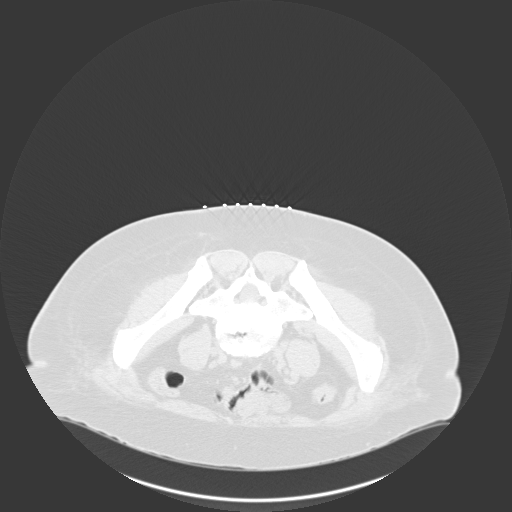
[im 27/39  lung]
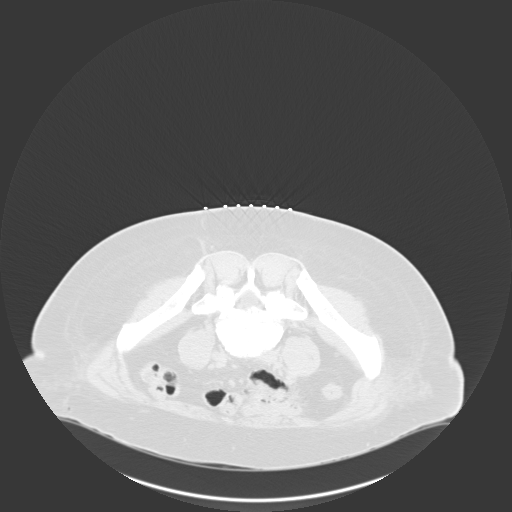
[im 29/39  lung]
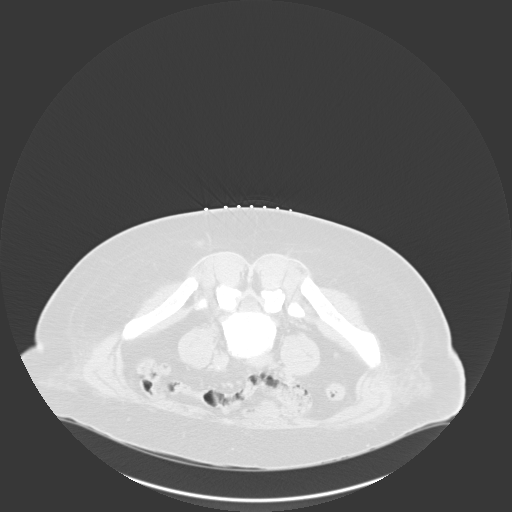
[im 32/39  mediastinal]
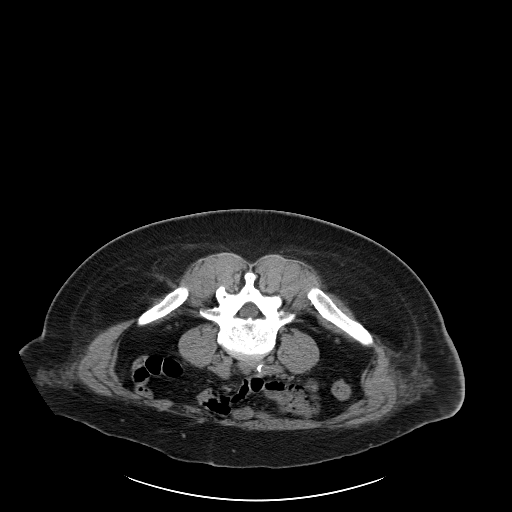
[im 32/39  lung]
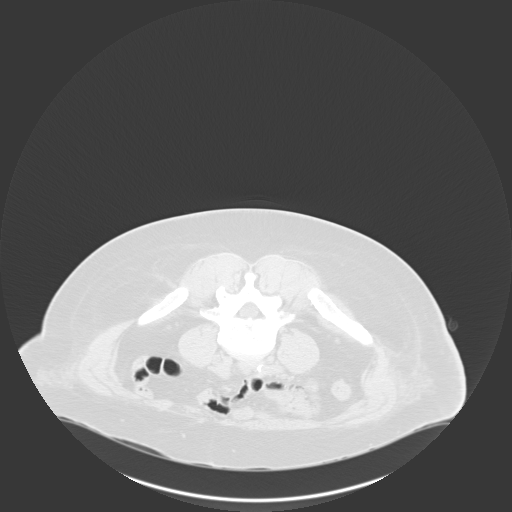
[im 34/39  lung]
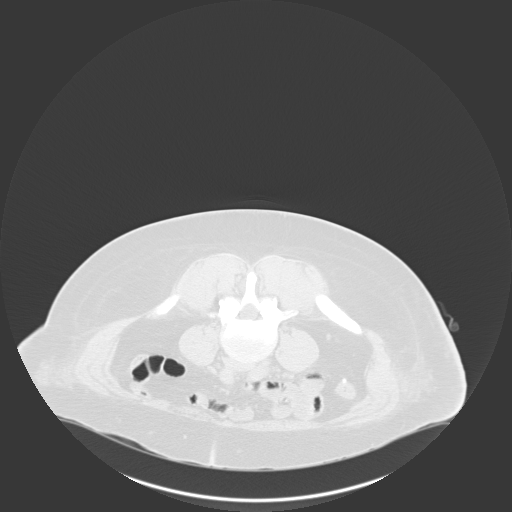
[im 37/39  lung]
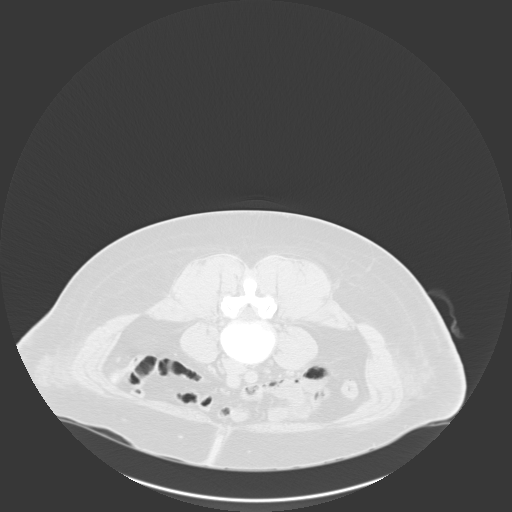

[15 of 29 positions shown; findings below may reference images not displayed]

EXAM:
CT-GUIDED BONE MARROW BIOPSY AND ASPIRATION

MEDICATIONS:
None

ANESTHESIA/SEDATION:
None

COMPLICATIONS:
None immediate.

PROCEDURE:
Informed consent was obtained from the patient following an
explanation of the procedure, risks, benefits and alternatives. The
patient understands, agrees and consents for the procedure. All
questions were addressed. A time out was performed prior to the
initiation of the procedure.

The patient was positioned prone and non-contrast localization CT
was performed of the pelvis to demonstrate the iliac marrow spaces.
The operative site was prepped and draped in the usual sterile
fashion.

Under sterile conditions and local anesthesia, a 22 gauge spinal
needle was utilized for procedural planning. Next, an 11 gauge
coaxial bone biopsy needle was advanced into the left iliac marrow
space. Needle position was confirmed with CT imaging. Initially, a
bone marrow aspiration was performed. Next, a bone marrow biopsy was
obtained with the 11 gauge outer bone marrow device. Samples were
prepared with the cytotechnologist and deemed adequate. The needle
was removed and superficial hemostasis was obtained with manual
compression. A dressing was applied. The patient tolerated the
procedure well without immediate post procedural complication.
IMPRESSION: Successful CT guided left iliac bone marrow aspiration and core
biopsy.

## 2020-03-21 MED ORDER — NALOXONE HCL 0.4 MG/ML IJ SOLN
INTRAMUSCULAR | Status: AC
  Filled 2020-03-21: qty 1

## 2020-03-21 MED ORDER — FENTANYL CITRATE (PF) 100 MCG/2ML IJ SOLN
INTRAMUSCULAR | Status: AC
  Filled 2020-03-21: qty 2

## 2020-03-21 MED ORDER — FENTANYL CITRATE (PF) 100 MCG/2ML IJ SOLN
INTRAMUSCULAR | Status: AC | PRN
  Administered 2020-03-21: 50 ug via INTRAVENOUS

## 2020-03-21 MED ORDER — ENOXAPARIN SODIUM 40 MG/0.4ML ~~LOC~~ SOLN
40.0000 mg | SUBCUTANEOUS | Status: DC
  Administered 2020-03-22: 40 mg via SUBCUTANEOUS
  Filled 2020-03-21 (×2): qty 0.4

## 2020-03-21 MED ORDER — FENTANYL CITRATE (PF) 100 MCG/2ML IJ SOLN
INTRAMUSCULAR | Status: AC | PRN
Start: 2020-03-21 — End: 2020-03-21
  Administered 2020-03-21: 50 ug via INTRAVENOUS

## 2020-03-21 NOTE — Progress Notes (Signed)
Physical Therapy Treatment Patient Details Name: Kaitlin Holloway MRN: [id number] DOB: 01/03/1971 Today's Date: 03/21/2020    History of Present Illness Pt is 49 y.o. female with PMH of COVID 19, DM, HTN, and chronic polyarthralgia presents to the emergency department with continued joint pain but new onset difficulty breathing. Pt stated joint pain started after likely covid infection in March 2020.  Pt with scheduled bone biopsy for today    PT Comments    Pt is demonstrating transfers with modified independent level (bed elevated).  She has been ambulating in room independently and in hall with supervision but with decreased ROM and speed.  She has joint pain throughout but also more in R knee from injury.  Discussed/educated on exercises for knee pain with return to outpt PT when able.    Follow Up Recommendations  Outpatient PT     Equipment Recommendations  None recommended by PT    Recommendations for Other Services       Precautions / Restrictions Precautions Precautions: None Precaution Comments: pt denies falls in past 1 year    Mobility  Bed Mobility Overal bed mobility: Modified Independent             General bed mobility comments: sitting at arrival  Transfers Overall transfer level: Modified independent Equipment used: None Transfers: Sit to/from Stand Sit to Stand: From elevated surface         General transfer comment: Pt able to perform sit to stand modified independent from elevated surface (she is 6'2" with joint pain); reports joint pain increases with lower transfer attempts  Ambulation/Gait Ambulation/Gait assistance: Supervision Gait Distance (Feet): 400 Feet Assistive device: None Gait Pattern/deviations: Step-through pattern;Decreased stride length;Wide base of support Gait velocity: decr   General Gait Details: Steady balance but slow gait with stiff ROM; pt described as "tin man walk" since COVID and knee injury   Stairs              Wheelchair Mobility    Modified Rankin (Stroke Patients Only)       Balance Overall balance assessment: Needs assistance Sitting-balance support: Feet supported;Feet unsupported Sitting balance-Leahy Scale: Good     Standing balance support: No upper extremity supported Standing balance-Leahy Scale: Good Standing balance comment: No AD needs but did demonstrate waivering gait pattern at times                            Cognition Arousal/Alertness: Awake/alert Behavior During Therapy: WFL for tasks assessed/performed Overall Cognitive Status: Within Functional Limits for tasks assessed                                 General Comments: Pt expressing frustration with her ongoing issues since COVID - "I'm 49 and feel like I'm 89"      Exercises      General Comments General comments (skin integrity, edema, etc.): Pt reports joint pain throughout since COVID.  However, states she also injured her R knee and then L knee hurts from favoring it after R knee injury.  Reports has had fluid drawn off R knee in past and was going to outpt PT.  R knee does appear swollen medially today.  She states in outpt she was told her knee cap was going laterally and was working on sit to stands with L knee in front, heel slides, and knee extension.  Showed pt  quad sets and discussed mini-squat on incline with feet facing downhill for increased VMO activiation. Pt also expressed desire to be able to return to yoga - discussed modified yoga with potential videos online -such as "chair yoga"      Pertinent Vitals/Pain Pain Assessment: 0-10 Pain Score: 5  Pain Location: shoulders, elbows, wrists, knees, ankles Pain Descriptors / Indicators: Grimacing;Guarding Pain Intervention(s): Limited activity within patient's tolerance;Monitored during session    Home Living                      Prior Function            PT Goals (current goals can now be found in  the care plan section) Acute Rehab PT Goals Patient Stated Goal: decrease pain, get stronger, get back to yoga and dancing PT Goal Formulation: With patient Time For Goal Achievement: 03/31/20 Potential to Achieve Goals: Good Progress towards PT goals: Progressing toward goals    Frequency    Min 3X/week      PT Plan Current plan remains appropriate    Co-evaluation              AM-PAC PT "6 Clicks" Mobility   Outcome Measure  Help needed turning from your back to your side while in a flat bed without using bedrails?: None Help needed moving from lying on your back to sitting on the side of a flat bed without using bedrails?: None Help needed moving to and from a bed to a chair (including a wheelchair)?: None Help needed standing up from a chair using your arms (e.g., wheelchair or bedside chair)?: None Help needed to walk in hospital room?: None Help needed climbing 3-5 steps with a railing? : A Little 6 Click Score: 23    End of Session   Activity Tolerance: Patient tolerated treatment well Patient left: in bed;with call bell/phone within reach Nurse Communication: Mobility status PT Visit Diagnosis: Difficulty in walking, not elsewhere classified (R26.2);Pain Pain - part of body: Shoulder;Hand;Knee;Ankle and joints of foot     Time: [id number] PT Time Calculation (min) (ACUTE ONLY): 31 min  Charges:  $Gait Training: 8-22 mins $Therapeutic Exercise: 8-22 mins                     Kaitlin Holloway, PT Acute Rehab Services Pager [phone] [facility] [phone]     Kaitlin Holloway 03/21/2020, 11:48 AM

## 2020-03-21 NOTE — Progress Notes (Signed)
MEDICATION-RELATED CONSULT NOTE   IR Procedure Consult - Anticoagulant/Antiplatelet PTA/Inpatient Med List Review by Pharmacist    Procedure: Bone Marrow Biopsy    Completed: 11/29 at 1300  Post-Procedural bleeding risk per IR MD assessment: Standard  Antithrombotic medications on inpatient or PTA profile prior to procedure:  Lovenox 61m SQ daily `    Recommended restart time per IR Post-Procedure Guidelines:  Next day in am   Other considerations:      Plan:   Lovenox 428mSQ daily rescheduled to resume 11/30 at 1000  GrMinda DittoharmD 03/21/2020, 1:24 PM

## 2020-03-21 NOTE — Progress Notes (Signed)
PROGRESS NOTE    Kaitlin Holloway  ENI:[id number] DOB: 07/09/1970 DOA: 03/15/2020 PCP: Kaitlin Hook, MD   Chief Complaint  Patient presents with  . Joint Pain    Brief Narrative: 49 year old female with past medical history of T2DM, last hemoglobin A1c around 211 5 days ago, has not been taking her medication, comes to the ED for evaluation of shortness of breath. As per report Kaitlin Holloway reported she thinks she contracted Covid March 2020,she  Reported she was in perfect health condition doing regular exercise a few times a week prior to that.However after March 2020 she started to have diffuse joint pain muscle aches,weakness, she has been taking ibuprofen 1-3 times per day for more than a year now, she noticed she is getting slower, and weaker she cannot make a fist due to stiffness and weakness, she will report proximal muscle weakness is harder for her to raise arms , harder for her to get from sitting position to stand up position .she presented to the hospital due to significant upper back pain to the point she cannot breath. Patient had managed her diabetes with yoga and dietary changes due to mistrust of the medical system. Patient has gone extensive evaluation in house since admission with RPR HIV negative, CK slightly elevated 1219 admission, improved to 360, CT chest abdomen pelvis, no PE, no acute infection, showed splenomegaly, skeletal muscle soft tissue stranding abnormal edema compatible with generalized myositis, L5-S1 disc space narrowing and endplate spurring, underwent MRI T-spine and L-spine no evidence of lumbar spine infection, some disc and facet degeneration.  Seen by neurology, hematology and undergoing rheumatologic work-up.  Her primary issue appears to be joint rather than muscle weakness, multiple bilateral symmetrical and small joints involved.  Subjective:  C/O aches and pain in wrist,elbow. shortness of breath improved.Pain worse with strain from the  pull. Afebrile overnight, on room air, vitals stable. Labs pending. Last CBC 11/25 with stable leukocytosis 18.2k. LDL 104, CRP 3.5, procalcitonin 0.1 sed rate 28, A1c 9.7 CCP antibody IgG/IgA-176 high, RA latex > 650 Antinuclear antibody negative anti-Jo 1 less than 0.2 Awaiting biopsy today.  Assessment & Plan:  Generalized body weight with polyarthralgia, bilateral ankle swelling, stiffness in the hands and proximal muscle weakness, small joint tenderness and tenderness on upper thoracic spine: Extensive work-up as above with CT spine, MRI spine, see report for details.  No PE on CT, no DVT on duplex, bilateral ankle x-ray with degenerative changes, lab with mild rhabdomyolysis, significantly elevated rheumatoid factor greater than 650, elevated CCP 176, mildly elevated ESR and CRP, ANA negative, anti-Jo 1 negative.  Kaitlin Kaitlin Holloway- " discussed with  Kaitlin Holloway who initially recommend transfer, however, Kaitlin is at capacity, no bed, Holloway recommend consult neurology and hematology here and advcised holding steroids until further work up,bilateral hand and foot x ray ordered per Holloway recommendation which showed no evidence of arthropathy.  Seen by neurology and hematology. ? 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  Seen by neurology ordered Mayo myositis panel for outpatient follow-up, aldolase, SPEP/UPEP/IFE, B12 for neuropathy work-up, EMG NCS as an outpatient.  Dyspnea on admission, currently improved.  CTA chest no acute finding.  Echocardiogram with a small pericardial effusion otherwise unremarkable.  Currently doing well on room air  SVT ? nonsustained initially with activity not sure if patient had tachycardia during dyspnea episode.  TSH unremarkable, started on Lopressor.  Non-insulin dependent diabetes mellitus, uncontrolled with hyperglycemia, poorly controlled hemoglobin A1c 9.7.  Not taking any medication at home.  Continue  sliding scale insulin, patient agreed for glipizide at discharge.  She has neuropathy will need Neurontin and ophthalmology outpatient follow-up  Recent history of cataract surgery on right eye.  Hypertension not on medication at home.  Does not take lisinopril but willing to try losartan.  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  BP is controlled.  Patient made a comment to Kaitlin Holloway " if we didn't figure it out soon she wanted to just die" Neurology was consulted and currently denies suicidal ideation and has been cleared.  Nutrition: Diet Order            Diet heart healthy/carb modified Room service appropriate? Yes; Fluid consistency: Thin  Diet effective now                 Body mass index is 28.25 kg/m.  DVT prophylaxis: enoxaparin (LOVENOX) injection 40 mg Start: 03/17/20 1800 Place and maintain sequential compression device Start: 03/17/20 1714 Code Status:   Code Status: Full Code  Family Communication: plan of care discussed with patient at bedside.  Status is: Inpatient Remains inpatient appropriate because:Ongoing diagnostic testing needed not appropriate for outpatient work up and Inpatient level of care appropriate due to severity of illness  Dispo: The patient is from: Home              Anticipated d/c is to: Home              Anticipated d/c date is: 3 days              Patient currently is not medically stable to d/c.  Consultants:see note  Procedures:see note  Culture/Microbiology    Component Value Date/Time   SDES  03/19/2020 1647    BLOOD LEFT ANTECUBITAL Performed at [facility], [address] [place], [place] [address]    SPECREQUEST  03/19/2020 1647    BOTTLES DRAWN AEROBIC ONLY Blood Culture adequate volume Performed at [facility], [address] [place], [place] [address]    CULT  03/19/2020 1647    NO GROWTH < 12 HOURS Performed at [facility], [address] [place], [place] [address]    REPTSTATUS PENDING 03/19/2020 1647    Other culture-see note  Medications: Scheduled Meds: . enoxaparin (LOVENOX) injection  40 mg Subcutaneous Q24H  . insulin aspart  0-9 Units Subcutaneous TID WC  . insulin aspart  3 Units Subcutaneous TID WC  . losartan  25 mg Oral Daily  . melatonin  3 mg Oral QHS  . metoprolol tartrate  12.5 mg Oral BID  . nystatin   Topical BID  . senna-docusate  1 tablet Oral BID   Continuous Infusions:  Antimicrobials: Anti-infectives (From admission, onward)   Start     Dose/Rate Route Frequency Ordered Stop   03/16/20 0415  doxycycline (VIBRAMYCIN) 100 mg in sodium chloride 0.9 % 250 mL IVPB        100 mg 125 mL/hr over 120 Minutes Intravenous  Once 03/16/20 0407 03/16/20 0637     Objective: Vitals: Today's Vitals   03/20/20 2242 03/20/20 2328 03/21/20 0158 03/21/20 0631  BP: 136/74  134/86 119/78  Pulse: 80  76 75  Resp: 16  16 16  Temp: 98.3 F (36.8 C)  98.2 F (36.8 C) 98.3 F (36.8 C)  TempSrc: Oral  Oral Oral  SpO2: 98%  98% 97%    Weight:      Height:      PainSc:  3       Intake/Output Summary (Last 24 hours) at 03/21/2020 0844 Last data filed at 03/21/2020 0800 Gross per 24 hour  Intake --  Output 1400 ml  Net -1400 ml   Filed Weights   03/16/20 0539  Weight: 99.8 kg   Weight change:   Intake/Output from previous day: 11/28 0701 - 11/29 0700 In: -  Out: 1100 [Urine:1100] Intake/Output this shift: Total I/O In: -  Out: 300 [Urine:300]  Examination: General exam: AAOx3 ,NAD, weak appearing. HEENT:Oral mucosa moist, Ear/Nose WNL grossly,dentition normal. Respiratory system: bilaterally clear,no wheezing or crackles,no use of accessory muscle, non tender. Cardiovascular system: S1 & S2 +, regular, No JVD. Gastrointestinal system: Abdomen soft, NT,ND, BS+. Nervous System:Alert, awake, moving extremities and grossly nonfocal Extremities: No edema, distal peripheral pulses  palpable.  Skin: No rashes,no icterus. MSK: Normal muscle bulk,tone, power  Data Reviewed: I have personally reviewed following labs and imaging studies CBC: Recent Labs  Lab 03/16/20 0004 03/16/20 0810 03/17/20 0500 03/18/20 0541  WBC 19.2* 18.2* 18.2* 18.2*  NEUTROABS 4.9 3.7 3.1 3.6  HGB 13.8 13.3 13.1 13.9  HCT 38.9 38.1 37.1 39.8  MCV 84.9 87.2 86.7 86.9  PLT 214 187 208 206   Basic Metabolic Panel: Recent Labs  Lab 03/16/20 0004 03/16/20 0810 03/17/20 0500 03/18/20 0541  NA 135 134* 133* 134*  K 4.0 3.8 3.9 3.8  CL 100 102 100 102  CO2 24 22 26 23  GLUCOSE 285* 237* 282* 189*  BUN 17 14 14 15  CREATININE 0.73 0.70 0.73 0.86  CALCIUM 8.8* 8.3* 8.3* 8.7*  MG  --   --  1.8  --    GFR: Estimated Creatinine Clearance: 106.9 mL/min (by C-G formula based on SCr of 0.86 mg/dL). Liver Function Tests: Recent Labs  Lab 03/16/20 0004 03/16/20 0810  AST 38 29  ALT 44 34  ALKPHOS 107 82  BILITOT 0.9 0.7  PROT 6.6 5.6*  ALBUMIN 3.1* 2.8*   No results for input(s): LIPASE, AMYLASE in the last 168 hours. No results for input(s): AMMONIA in the last 168 hours. Coagulation Profile: No results for input(s): INR, PROTIME in the last 168 hours. Cardiac Enzymes: Recent Labs  Lab 03/16/20 0004 03/16/20 0810 03/18/20 0541  CKTOTAL 1,219* 773* 360*   BNP (last 3 results) No results for input(s): PROBNP in the last 8760 hours. HbA1C: No results for input(s): HGBA1C in the last 72 hours. CBG: Recent Labs  Lab 03/20/20 0744 03/20/20 1151 03/20/20 1637 03/20/20 2101 03/21/20 0735  GLUCAP 215* 164* 239* 283* 217*   Lipid Profile: No results for input(s): CHOL, HDL, LDLCALC, TRIG, CHOLHDL, LDLDIRECT in the last 72 hours. Thyroid Function Tests: No results for input(s): TSH, T4TOTAL, FREET4, T3FREE, THYROIDAB in the last 72 hours. Anemia Panel: No results for input(s): VITAMINB12, FOLATE, FERRITIN, TIBC, IRON, RETICCTPCT in the last 72 hours. Sepsis  Labs: Recent Labs  Lab 03/16/20 0810  PROCALCITON 0.10    Recent Results (from the past 240 hour(s))  Resp Panel by RT-PCR (Flu A&B, Covid) Nasopharyngeal Swab     Status: None   Collection Time: 03/16/20 12:04 AM   Specimen: Nasopharyngeal Swab; Nasopharyngeal(NP) swabs in vial transport medium  Result Value Ref Range Status   SARS Coronavirus 2 by RT PCR NEGATIVE NEGATIVE Final    Comment: (NOTE) SARS-CoV-2 target nucleic acids are NOT DETECTED.  The SARS-CoV-2 RNA is generally detectable   in upper respiratory specimens during the acute phase of infection. The lowest concentration of SARS-CoV-2 viral copies this assay can detect is 138 copies/mL. A negative result does not preclude SARS-Cov-2 infection and should not be used as the sole basis for treatment or other patient management decisions. A negative result may occur with  improper specimen collection/handling, submission of specimen other than nasopharyngeal swab, presence of viral mutation(s) within the areas targeted by this assay, and inadequate number of viral copies(<138 copies/mL). A negative result must be combined with clinical observations, patient history, and epidemiological information. The expected result is Negative.  Fact Sheet for Patients:  https://www.fda.gov/media/152166/download  Fact Sheet for Healthcare Providers:  https://www.fda.gov/media/152162/download  This test is no t yet approved or cleared by the [place] FDA and  has been authorized for detection and/or diagnosis of SARS-CoV-2 by FDA under an Emergency Use Authorization (EUA). This EUA will remain  in effect (meaning this test can be used) for the duration of the COVID-19 declaration under Section 564(b)(1) of the Act, 21 U.S.C.section 360bbb-3(b)(1), unless the authorization is terminated  or revoked sooner.       Influenza A by PCR NEGATIVE NEGATIVE Final   Influenza B by PCR NEGATIVE NEGATIVE Final    Comment: (NOTE) The  Xpert Xpress SARS-CoV-2/FLU/RSV plus assay is intended as an aid in the diagnosis of influenza from Nasopharyngeal swab specimens and should not be used as a sole basis for treatment. Nasal washings and aspirates are unacceptable for Xpert Xpress SARS-CoV-2/FLU/RSV testing.  Fact Sheet for Patients: https://www.fda.gov/media/152166/download  Fact Sheet for Healthcare Providers: https://www.fda.gov/media/152162/download  This test is not yet approved or cleared by the [place] FDA and has been authorized for detection and/or diagnosis of SARS-CoV-2 by FDA under an Emergency Use Authorization (EUA). This EUA will remain in effect (meaning this test can be used) for the duration of the COVID-19 declaration under Section 564(b)(1) of the Act, 21 U.S.C. section 360bbb-3(b)(1), unless the authorization is terminated or revoked.  Performed at [facility], [address] [place], [place] [address]   Culture, blood (routine x 2)     Status: None (Preliminary result)   Collection Time: 03/16/20  7:55 AM   Specimen: BLOOD  Result Value Ref Range Status   Specimen Description   Final    BLOOD LEFT ANTECUBITAL Performed at [facility], [address] [place], [place] [address]    Special Requests   Final    BOTTLES DRAWN AEROBIC ONLY Blood Culture adequate volume Performed at [facility], [address] [place], [place] [address]    Culture   Final    NO GROWTH 4 DAYS Performed at [facility], [address] [place], [place] [address]    Report Status PENDING  Incomplete  Culture, blood (routine x 2)     Status: None (Preliminary result)   Collection Time: 03/16/20  8:10 AM   Specimen: BLOOD  Result Value Ref Range Status   Specimen Description   Final    BLOOD LEFT ANTECUBITAL Performed at [facility], [address] [place], [place] [address]    Special Requests   Final    BOTTLES  DRAWN AEROBIC ONLY Blood Culture results may not be optimal due to an inadequate volume of blood received in culture bottles Performed at [facility], [address] [place],  [address]    Culture   Final    NO GROWTH 4 DAYS Performed   at [facility], [address] [place], [place] [address]    Report Status PENDING  Incomplete  Culture, blood (routine x 2)     Status: None (Preliminary result)   Collection Time: 03/19/20  4:39 PM   Specimen: BLOOD  Result Value Ref Range Status   Specimen Description   Final    BLOOD LEFT ANTECUBITAL Performed at [facility], [address] [place], [place] [address]    Special Requests   Final    BOTTLES DRAWN AEROBIC ONLY Blood Culture adequate volume Performed at [facility], [address] [place], [place] [address]    Culture   Final    NO GROWTH < 12 HOURS Performed at [facility], [address] [place], [place] [address]    Report Status PENDING  Incomplete  Culture, blood (routine x 2)     Status: None (Preliminary result)   Collection Time: 03/19/20  4:47 PM   Specimen: BLOOD  Result Value Ref Range Status   Specimen Description   Final    BLOOD LEFT ANTECUBITAL Performed at [facility], [address] [place], [place] [address]    Special Requests   Final    BOTTLES DRAWN AEROBIC ONLY Blood Culture adequate volume [facility], [address] [place], [place] [address]    Culture   Final    NO GROWTH < 12 HOURS Performed at [facility], [address] [place], [place] [address]    Report Status PENDING  Incomplete     Radiology Studies: MR Lumbar Spine W Wo Contrast  Result Date: 03/19/2020 CLINICAL DATA:  Low back pain. EXAM: MRI LUMBAR SPINE WITHOUT AND WITH CONTRAST TECHNIQUE: Multiplanar and multiecho pulse sequences of the lumbar spine were obtained without and with  intravenous contrast. CONTRAST:  10mL GADAVIST GADOBUTROL 1 MMOL/ML IV SOLN COMPARISON:  CT abdomen and pelvis 03/19/2020. Thoracic spine MRI 03/16/2020. FINDINGS: Segmentation:  Standard. Alignment:  Normal. Vertebrae: No fracture, suspicious osseous lesion, significant marrow edema, or evidence of discitis. Chronic degenerative endplate changes at L5-S1. No epidural fluid collection. Conus medullaris and cauda equina: Conus extends to the T12 level. Conus and cauda equina appear normal. Paraspinal and other soft tissues: No paraspinal fluid collection. No significant lumbar paraspinal muscle edema in comparison to the thoracic edema on the prior MRI. 1.2 cm left renal cyst. Disc levels: L1-2 and L1-2: Normal discs.  Mild facet arthrosis without stenosis. L3-4: Minimal disc bulging and mild facet arthrosis without stenosis. L4-5: Mild disc bulging and mild facet arthrosis without stenosis. L5-S1: Disc desiccation and moderate disc space narrowing. Disc bulging, endplate spurring, disc space height loss, and mild facet arthrosis result in mild right neural foraminal stenosis and mild left lateral recess stenosis without spinal stenosis. IMPRESSION: 1. No evidence of lumbar spine infection. 2. Lower lumbar disc and facet degeneration, greatest at L5-S1 where there is mild right neural foraminal and left lateral recess stenosis. Electronically Signed   By: Allen  Grady M.D.   On: 03/19/2020 20:32   CT ABDOMEN PELVIS W CONTRAST  Result Date: 03/19/2020 CLINICAL DATA:  Diffuse joint pain with marked eosinophilia. Evaluate for splenomegaly, adenopathy or hepatomegaly. EXAM: CT ABDOMEN AND PELVIS WITH CONTRAST TECHNIQUE: Multidetector CT imaging of the abdomen and pelvis was performed using the standard protocol following bolus administration of intravenous contrast. CONTRAST:  100mL OMNIPAQUE IOHEXOL 300 MG/ML  SOLN COMPARISON:  CT angio chest 03/16/2020 FINDINGS:   Lower chest: No acute abnormality. Hepatobiliary: No  focal liver abnormality is seen. The liver has an approximate volume of 2100 cc with a length of 19.58. No gallstones, gallbladder wall thickening, or biliary dilatation. Pancreas: Unremarkable. No pancreatic ductal dilatation or surrounding inflammatory changes. Spleen: This lean measures 14.3 by 10.3 x 6.2 cm (volume = 480 cm^3) no focal liver lesion identified. Adrenals/Urinary Tract: Normal appearance of the adrenal glands. Small simple appearing cyst within upper pole of left kidney measures 1.2 cm, image 28/2. The urinary bladder appears normal. Stomach/Bowel: Stomach is nondistended. No bowel wall thickening, inflammation, or distension. Vascular/Lymphatic: Aortic atherosclerosis. No aneurysm. No abdominal adenopathy. Prominent bilateral pelvic lymph nodes are identified a few of which are borderline enlarged including left external iliac node measuring 1.1 cm, image 79/2. Right external iliac lymph node measures 1.0 cm. Reproductive: Status post hysterectomy. No adnexal masses. Other: No free fluid or fluid collections identified. Musculoskeletal: Similar to recent MR findings there is abnormal edema and soft tissue stranding surrounding the skeletal muscles compatible with generalized myositis. No aggressive lytic or sclerotic bone lesions identified. Disc space narrowing and endplate spurring is identified at L5-S1 along with vacuum disc phenomenon. However, the endplates appear irregular with loss of normal definition with increasing lucency. There is no surrounding increased soft tissue, soft tissue stranding or fluid collection. IMPRESSION: 1. Splenomegaly. 2. Abnormal edema and soft tissue stranding surrounding the skeletal muscles compatible with generalized myositis. 3. Disc space narrowing and endplate spurring at L5-S1 along with vacuum disc phenomenon. However, the endplates appear irregular with loss of normal definition and increasing lucency. No surrounding increased soft tissue, soft tissue  stranding or fluid collection. Findings are favored to represent degenerative disc disease. However, given the elevated white blood cell count, ESR, and CRP early discitis cannot be excluded. If there are clinical signs or symptoms suggestive of discitis at L5-S1 consider further investigation with contrast enhanced MRI of the lumbar spine. 4. Aortic atherosclerosis. Aortic Atherosclerosis (ICD10-I70.0). These results will be called to the ordering clinician or representative by the Radiologist Assistant, and communication documented in the PACS or [facility] Dashboard. Electronically Signed   By: Taylor  Stroud M.D.   On: 03/19/2020 16:06   DG Hand 2 View Right  Result Date: 03/19/2020 CLINICAL DATA:  Pain EXAM: RIGHT HAND - 2 VIEW COMPARISON:  None. FINDINGS: Frontal and lateral views were obtained. No appreciable fracture or dislocation. Joint spaces appear unremarkable. No erosive change or periostitis. Bony mineralization unremarkable. IMPRESSION: No fracture or dislocation.  No appreciable arthropathic change. Electronically Signed   By: William  Woodruff III M.D.   On: 03/19/2020 10:52   DG Hand 2 View Left  Result Date: 03/19/2020 CLINICAL DATA:  Pain EXAM: LEFT HAND - 2 VIEW COMPARISON:  None. FINDINGS: Frontal and lateral views were obtained. No fracture or dislocation. Joint spaces appear normal. No erosive change or periostitis. Bony mineralization normal. IMPRESSION: No fracture or dislocation.  No evident arthropathy. Electronically Signed   By: William  Woodruff III M.D.   On: 03/19/2020 10:56   DG Foot 2 Views Left  Result Date: 03/19/2020 CLINICAL DATA:  Bilateral foot pain since March 2020 EXAM: LEFT FOOT - 2 VIEW; RIGHT FOOT - 2 VIEW COMPARISON:  None. FINDINGS: There is no evidence of fracture or dislocation of the bilateral feet. Dorsal spurring/hypertrophy at the left talonavicular joint, likely reflects sequela of remote trauma. Overall, joint spaces of the bilateral feet are  maintained. No bony erosion. No periostitis. Soft tissues are unremarkable.   IMPRESSION: No acute osseous abnormality or significant arthropathy of the bilateral feet. Electronically Signed   By: Davina Poke D.O.   On: 03/19/2020 10:58   DG Foot 2 Views Right  Result Date: 03/19/2020 CLINICAL DATA:  Bilateral foot pain since March 2020 EXAM: LEFT FOOT - 2 VIEW; RIGHT FOOT - 2 VIEW COMPARISON:  None. FINDINGS: There is no evidence of fracture or dislocation of the bilateral feet. Dorsal spurring/hypertrophy at the left talonavicular joint, likely reflects sequela of remote trauma. Overall, joint spaces of the bilateral feet are maintained. No bony erosion. No periostitis. Soft tissues are unremarkable. IMPRESSION: No acute osseous abnormality or significant arthropathy of the bilateral feet. Electronically Signed   By: Davina Poke D.O.   On: 03/19/2020 10:58     LOS: 4 days   Antonieta Pert, MD [facility]  03/21/2020, 8:44 AM

## 2020-03-21 NOTE — Consult Note (Signed)
[facility] Face-to-Face Psychiatry Consult   Reason for Consult: Suicidal ideation Referring Physician: Dr. Lanae Boast Patient Identification: Kaitlin Holloway MRN:  [id number] Principal Diagnosis: Dyspnea Diagnosis:  Principal Problem:   Dyspnea Active Problems:   Polyarthralgia   Hyperglycemia   Elevated CK   Myositis   Total Time spent with patient: 30 minutes  Subjective:   Kaitlin Holloway is a 49 y.o. female patient admitted with pain.  Patient reports generalized body aches, myalgia, and pain secondary to COVID-19 infection.  She reports ongoing stressors that contribute to her chronic morbidities and chronic pain syndrome.  Patient does admit to making the statement" if you cannot figure out what is wrong with me then I might as well die."  She states she is not suicidal, has never been suicidal, and it was just a statement.  Patient reports that " no one knows what will happen to her" however she is a firm believer in christ.  She states" I would not be going to hell.  I am living in Kaitlin Holloway on earth and do not want to live in Kaitlin Holloway after I die.  That is how you know I will not hurt myself. "  Patient continues to adamantly refuse any thought or intent of suicidality.  She continues to refute suicidal thoughts, homicidal thoughts, and or auditory visual hallucinations.  She denies any previous psychiatric diagnosis, outpatient services, inpatient admission, and or substance abuse.  She does acknowledge that her chronic conditions have impacted her mental health however she has been able to overcome most of it.  She does not express any interest in continuing the evaluation, as she is states she is not suicidal."  Sorry they wasted your time, but it was just a statement and not a thought."   HPI:  Kaitlin Holloway is a 49 y.o. female with history of diabetes mellitus type 2 last hemoglobin A1c was around more than 12 around 500 days ago has not been taking her medications presents to the ER because of  shortness of breath.  Patient shortness of breath started last evening around 4 PM while at home.  Patient states since March 2020 patient developed upper respiratory tract infection which patient assumed it could be COVID-19 infection but no definite evidence for that since then patient has been having generalized body ache joint swelling of the extremities.  Which is progressively got worse.  Has developed joint swelling of the lower extremities particularly.  Patient also over the last 3 weeks after her cataract surgery recent started to notice rash on her thighs.  Which are itching.  Patient takes ibuprofen for her generalized body ache and joint swelling.  Patient has stiffness of upper extremities and cannot make a fist because of the swelling.  Past Psychiatric History: Denies  Risk to Self:  Denies Risk to Others:  Denies denies Prior Inpatient Therapy:  Denies Prior Outpatient Therapy:   Denies Past Medical History:  Past Medical History:  Diagnosis Date  . Arthritis    knee  . COVID-19 06/2018   Residual joint pain  . Diabetes mellitus without complication (HCC)    Type 2, does not take her meds.  . Hypertension 2002  . Joint pain    since having COVID 06/2018  . Motion sickness    boats, car - back seat  . Prediabetes 2016    Past Surgical History:  Procedure Laterality Date  . ABDOMINAL HYSTERECTOMY  2016   unilateral oophorectomy.  Does not know what side.  For  fibroids and heavy bleeding.  . APPENDECTOMY     age 62  . CATARACT EXTRACTION W/PHACO Right 01/28/2020   Procedure: CATARACT EXTRACTION PHACO AND INTRAOCULAR LENS PLACEMENT (IOC) RIGHT VISION BLUE 13.73  01:10.8;  Surgeon: Elliot Cousin, MD;  Location: [facility];  Service: Ophthalmology;  Laterality: Right;  Diabetes Latex  . TONSILLECTOMY     Family History:  Family History  Problem Relation Age of Onset  . Heart disease Mother 26       AMI  . Diabetes Mother   . Hypertension Mother   .  Hyperlipidemia Mother    Family Psychiatric  History: Denies Social History:  Social History   Substance and Sexual Activity  Alcohol Use No   Comment: rare     Social History   Substance and Sexual Activity  Drug Use No   Comment: MJ when young    Social History   Socioeconomic History  . Marital status: Single    Spouse name: Not on file  . Number of children: 1  . Years of education: Not on file  . Highest education level: Master's degree (e.g., MA, MS, MEng, MEd, MSW, MBA)  Occupational History  . Occupation: Database administrator.  Tobacco Use  . Smoking status: Former Smoker    Packs/day: 1.50    Years: 17.00    Pack years: 25.50    Types: Cigarettes    Quit date: 07/10/2000    Years since quitting: 19.7  . Smokeless tobacco: Never Used  Vaping Use  . Vaping Use: Never used  Substance and Sexual Activity  . Alcohol use: No    Comment: rare  . Drug use: No    Comment: MJ when young  . Sexual activity: Not on file  Other Topics Concern  . Not on file  Social History Narrative   Lives alone, no support.Marland Kitchen   Has been hard during COVID19 pandemic.    Social Determinants of Health   Financial Resource Strain:   . Difficulty of Paying Living Expenses: Not on file  Food Insecurity:   . Worried About Programme researcher, broadcasting/film/video in the Last Year: Not on file  . Ran Out of Food in the Last Year: Not on file  Transportation Needs:   . Lack of Transportation (Medical): Not on file  . Lack of Transportation (Non-Medical): Not on file  Physical Activity:   . Days of Exercise per Week: Not on file  . Minutes of Exercise per Session: Not on file  Stress:   . Feeling of Stress : Not on file  Social Connections:   . Frequency of Communication with Friends and Family: Not on file  . Frequency of Social Gatherings with Friends and Family: Not on file  . Attends Religious Services: Not on file  . Active Member of Clubs or Organizations: Not on file  . Attends  Banker Meetings: Not on file  . Marital Status: Not on file   Additional Social History:    Allergies:   Allergies  Allergen Reactions  . Penicillins Other (See Comments)    Has patient had a PCN reaction causing immediate rash, facial/tongue/throat swelling, SOB or lightheadedness with hypotension: Yes Has patient had a PCN reaction causing severe rash involving mucus membranes or skin necrosis: No Has patient had a PCN reaction that required hospitalization: No Has patient had a PCN reaction occurring within the last 10 years: No If all of the above answers are "NO", then may proceed with  Cephalosporin use.   . Aspirin Other (See Comments)    Muscle spasms in back  . Garlic Swelling  . Morphine And Related Other (See Comments)    Headaches/ migraine  . Gadavist [Gadobutrol] Nausea And Vomiting    Nausea and vomiting immediately after contrast injection   . Latex Rash    Gloves - when worn    Labs:  Results for orders placed or performed during the hospital encounter of 03/15/20 (from the past 48 hour(s))  Culture, blood (routine x 2)     Status: None (Preliminary result)   Collection Time: 03/19/20  4:39 PM   Specimen: BLOOD  Result Value Ref Range   Specimen Description      BLOOD LEFT ANTECUBITAL Performed at [facility], [address] [place], [place] [address]    Special Requests      BOTTLES DRAWN AEROBIC ONLY Blood Culture adequate volume Performed at [facility], [address] [place], [place] [address]    Culture      NO GROWTH < 12 HOURS Performed at [facility], [address], [place], [place] [address]    Report Status PENDING   Culture, blood (routine x 2)     Status: None (Preliminary result)   Collection Time: 03/19/20  4:47 PM   Specimen: BLOOD  Result Value Ref Range   Specimen Description      BLOOD LEFT ANTECUBITAL Performed at [facility], [address], [place], [place] [address]    Special Requests      BOTTLES DRAWN AEROBIC ONLY Blood Culture adequate volume Performed at [facility], [address] [place], [place] [address]    Culture      NO GROWTH < 12 HOURS Performed at [facility], [address], [place], [place] [address]    Report Status PENDING   Glucose, capillary     Status: Abnormal   Collection Time: 03/19/20  4:55 PM  Result Value Ref Range   Glucose-Capillary 220 (H) 70 - 99 mg/dL    Comment: Glucose reference range applies only to samples taken after fasting for at least 8 hours.  Glucose, capillary     Status: Abnormal   Collection Time: 03/19/20  8:18 PM  Result Value Ref Range   Glucose-Capillary 223 (H) 70 - 99 mg/dL    Comment: Glucose reference range applies only to samples taken after fasting for at least 8 hours.  Lactate dehydrogenase     Status: Abnormal   Collection Time: 03/20/20  6:42 AM  Result Value Ref Range   LDH 385 (H) 98 - 192 U/L    Comment: Performed at [facility], [address], [place], [place] [address]  Glucose, capillary     Status: Abnormal   Collection Time: 03/20/20  7:44 AM  Result Value Ref Range   Glucose-Capillary 215 (H) 70 - 99 mg/dL    Comment: Glucose reference range applies only to samples taken after fasting for at least 8 hours.  Glucose, capillary     Status: Abnormal   Collection Time: 03/20/20 11:51 AM  Result Value Ref Range   Glucose-Capillary 164 (H) 70 - 99 mg/dL    Comment: Glucose reference range applies only to samples taken after fasting for at least 8 hours.  Glucose, capillary     Status: Abnormal   Collection Time: 03/20/20  4:37 PM  Result Value Ref Range   Glucose-Capillary 239 (  H) 70 - 99 mg/dL    Comment: Glucose reference range applies only to samples taken after fasting for at least 8 hours.  Glucose, capillary     Status: Abnormal   Collection Time: 03/20/20  9:01 PM  Result Value Ref Range    Glucose-Capillary 283 (H) 70 - 99 mg/dL    Comment: Glucose reference range applies only to samples taken after fasting for at least 8 hours.   Comment 1 Notify RN   Lactate dehydrogenase     Status: Abnormal   Collection Time: 03/21/20  7:27 AM  Result Value Ref Range   LDH 360 (H) 98 - 192 U/L    Comment: Performed at [facility], [address], [place], [place] [address]  CBC with Differential/Platelet     Status: Abnormal   Collection Time: 03/21/20  7:27 AM  Result Value Ref Range   WBC 18.7 (H) 4.0 - 10.5 K/uL   RBC 4.55 3.87 - 5.11 MIL/uL   Hemoglobin 14.0 12.0 - 15.0 g/dL   HCT 83.2 36 - 46 %   MCV 86.6 80.0 - 100.0 fL   MCH 30.8 26.0 - 34.0 pg   MCHC 35.5 30.0 - 36.0 g/dL   RDW 91.9 16.6 - 06.0 %   Platelets 213 150 - 400 K/uL   nRBC 0.0 0.0 - 0.2 %   Neutrophils Relative % 18 %   Neutro Abs 3.4 1.7 - 7.7 K/uL   Lymphocytes Relative 9 %   Lymphs Abs 1.7 0.7 - 4.0 K/uL   Monocytes Relative 3 %   Monocytes Absolute 0.5 0.1 - 1.0 K/uL   Eosinophils Relative 70 %   Eosinophils Absolute 13.0 (H) 0.0 - 0.5 K/uL   Basophils Relative 0 %   Basophils Absolute 0.0 0.0 - 0.1 K/uL   Immature Granulocytes 0 %   Abs Immature Granulocytes 0.03 0.00 - 0.07 K/uL    Comment: Performed at [facility], [address], [place], [place] [address]  Comprehensive metabolic panel     Status: Abnormal   Collection Time: 03/21/20  7:27 AM  Result Value Ref Range   Sodium 135 135 - 145 mmol/L   Potassium 4.1 3.5 - 5.1 mmol/L   Chloride 101 98 - 111 mmol/L   CO2 24 22 - 32 mmol/L   Glucose, Bld 225 (H) 70 - 99 mg/dL    Comment: Glucose reference range applies only to samples taken after fasting for at least 8 hours.   BUN 19 6 - 20 mg/dL   Creatinine, Ser 7.74 0.44 - 1.00 mg/dL   Calcium 8.9 8.9 - 14.2 mg/dL   Total Protein 6.5 6.5 - 8.1 g/dL   Albumin 3.2 (L) 3.5 - 5.0 g/dL   AST 21 15 - 41 U/L   ALT 22 0 - 44 U/L   Alkaline Phosphatase 75 38 -  126 U/L   Total Bilirubin 0.6 0.3 - 1.2 mg/dL   GFR, Estimated >39 >53 mL/min    Comment: (NOTE) Calculated using the CKD-EPI Creatinine Equation (2021)    Anion gap 10 5 - 15    Comment: Performed at [facility], [address] [place], [place] [address]  Sedimentation rate     Status: Abnormal   Collection Time: 03/21/20  7:27 AM  Result Value Ref Range   Sed Rate 35 (H) 0 - 22 mm/hr    Comment: Performed at [facility], [address] [place], [place] [address]  C-reactive protein     Status: Abnormal   Collection Time: 03/21/20  7:27 AM  Result Value Ref Range   CRP 2.7 (H) <1.0 mg/dL    Comment: Performed at [facility], [address], [place], [place] [address]  CK     Status: Abnormal   Collection Time: 03/21/20  7:27 AM  Result Value Ref Range   Total CK 428 (H) 38.0 - 234.0 U/L    Comment: Performed at [facility], [address] [place], [place] [address]  Vitamin B12     Status: None   Collection Time: 03/21/20  7:27 AM  Result Value Ref Range   Vitamin B-12 473 180 - 914 pg/mL    Comment: (NOTE) This assay is not validated for testing neonatal or myeloproliferative syndrome specimens for Vitamin B12 levels. Performed at [facility], [address], [place], [place] [address]   Glucose, capillary     Status: Abnormal   Collection Time: 03/21/20  7:35 AM  Result Value Ref Range   Glucose-Capillary 217 (H) 70 - 99 mg/dL    Comment: Glucose reference range applies only to samples taken after fasting for at least 8 hours.    Current Facility-Administered Medications  Medication Dose Route Frequency Provider Last Rate Last Admin  . acetaminophen (TYLENOL) tablet 650 mg  650 mg Oral Q6H PRN Eduard Clos, MD   650 mg at 03/20/20 1025   Or  . acetaminophen (TYLENOL) suppository 650 mg  650 mg Rectal Q6H PRN Eduard Clos, MD      . enoxaparin (LOVENOX)  injection 40 mg  40 mg Subcutaneous Q24H Albertine Grates, MD   40 mg at 03/20/20 1648  . fentaNYL (SUBLIMAZE) injection 12.5 mcg  12.5 mcg Intravenous Q2H PRN Eduard Clos, MD   12.5 mcg at 03/20/20 2258  . insulin aspart (novoLOG) injection 0-9 Units  0-9 Units Subcutaneous TID WC Eduard Clos, MD   3 Units at 03/21/20 [phone]  . insulin aspart (novoLOG) injection 3 Units  3 Units Subcutaneous TID WC Albertine Grates, MD   3 Units at 03/21/20 [phone]  . losartan (COZAAR) tablet 25 mg  25 mg Oral Daily Albertine Grates, MD   25 mg at 03/21/20 0813  . melatonin tablet 3 mg  3 mg Oral QHS Albertine Grates, MD   3 mg at 03/20/20 2258  . metoprolol tartrate (LOPRESSOR) tablet 12.5 mg  12.5 mg Oral BID Albertine Grates, MD   12.5 mg at 03/21/20 0865  . nystatin (MYCOSTATIN/NYSTOP) topical powder   Topical BID Albertine Grates, MD   Given at 03/21/20 [phone]  . senna-docusate (Senokot-S) tablet 1 tablet  1 tablet Oral BID Albertine Grates, MD   1 tablet at 03/20/20 2258    Musculoskeletal: Strength & Muscle Tone: within normal limits Gait & Station: normal Patient leans: N/A  Psychiatric Specialty Exam: Physical Exam Vitals and nursing note reviewed.  Constitutional:      Appearance: Normal appearance. She is normal weight.  Neurological:     Mental Status: She is alert.  Psychiatric:        Mood and Affect: Mood normal.        Behavior: Behavior normal.        Thought Content: Thought content normal.        Judgment: Judgment normal.     Review of Systems  Psychiatric/Behavioral: Positive for dysphoric mood and sleep disturbance. Negative for agitation, behavioral problems, confusion, decreased concentration,  hallucinations, self-injury and suicidal ideas. The patient is not nervous/anxious and is not hyperactive.   All other systems reviewed and are negative.   Blood pressure 119/78, pulse 75, temperature 98.3 F (36.8 C), temperature source Oral, resp. rate 16, height 6\' 2"  (1.88 m), weight 99.8 kg, SpO2 97 %.Body mass index is  28.25 kg/m.  General Appearance: Fairly Groomed  Eye Contact:  Good  Speech:  Clear and Coherent and Normal Rate  Volume:  Normal  Mood:  Hopeless  Affect:  Congruent, Restricted and Tearful  Thought Process:  Coherent, Linear and Descriptions of Associations: Circumstantial  Orientation:  Full (Time, Place, and Person)  Thought Content:  Logical and Rumination  Suicidal Thoughts:  No  Homicidal Thoughts:  No  Memory:  Immediate;   Fair Recent;   Fair Remote;   Good  Judgement:  Good  Insight:  Good  Psychomotor Activity:  Normal  Concentration:  Concentration: Good and Attention Span: Good  Recall:  Good  Fund of Knowledge:  Good  Language:  Good  Akathisia:  No  Handed:  Right  AIMS (if indicated):     Assets:  Communication Skills Desire for Improvement Financial Resources/Insurance Leisure Time Physical Health Social Support Vocational/Educational  ADL's:  Intact  Cognition:  WNL  Sleep:        Treatment Plan Summary: Plan Recommend chaplain consult.  Also will benefit from outpatient behavioral therapy with strong Christian affiliation.  Disposition: No evidence of imminent risk to self or others at present.   Patient does not meet criteria for psychiatric inpatient admission. Supportive therapy provided about ongoing stressors. Discussed crisis plan, support from social network, calling 911, coming to the Emergency Department, and calling Suicide Hotline.  Maryagnes Amos, FNP 03/21/2020 11:40 AM

## 2020-03-21 NOTE — Progress Notes (Addendum)
Patient ID: Kaitlin Holloway, female   DOB: [birth year], 49 y.o.   MRN: [id number] Pt scheduled for CT guided bone marrow bx this am. She has hx DM, HTN, myositis, polyarthralgia, splenomegaly, leukocytosis and eosinophilia. Risks and benefits of procedure was discussed with the patient  including, but not limited to bleeding, infection, damage to adjacent structures or low yield requiring additional tests.  All of the questions were answered and there is agreement to proceed.  Consent signed and in chart.  Pt had breakfast this am therefore full IV conscious sedation will not be utilized

## 2020-03-21 NOTE — Procedures (Signed)
Pre-procedure Diagnosis: leukocytosis and eosinophilia Post-procedure Diagnosis: Same  Technically successful CT guided bone marrow aspiration and biopsy of left iliac crest.   Complications: None Immediate  EBL: None  Signed: Simonne Come Pager: [phone] 03/21/2020, 12:58 PM

## 2020-03-22 LAB — COMPREHENSIVE METABOLIC PANEL
ALT: 19 U/L (ref 0–44)
AST: 17 U/L (ref 15–41)
Albumin: 3.1 g/dL — ABNORMAL LOW (ref 3.5–5.0)
Alkaline Phosphatase: 71 U/L (ref 38–126)
Anion gap: 9 (ref 5–15)
BUN: 19 mg/dL (ref 6–20)
CO2: 24 mmol/L (ref 22–32)
Calcium: 8.9 mg/dL (ref 8.9–10.3)
Chloride: 101 mmol/L (ref 98–111)
Creatinine, Ser: 0.82 mg/dL (ref 0.44–1.00)
GFR, Estimated: >60 mL/min (ref >60)
Glucose, Bld: 220 mg/dL — ABNORMAL HIGH (ref 70–99)
Potassium: 4.4 mmol/L (ref 3.5–5.1)
Sodium: 134 mmol/L — ABNORMAL LOW (ref 135–145)
Total Bilirubin: 0.9 mg/dL (ref 0.3–1.2)
Total Protein: 6.4 g/dL — ABNORMAL LOW (ref 6.5–8.1)

## 2020-03-22 LAB — CBC
HCT: 39.4 % (ref 36.0–46.0)
Hemoglobin: 13.9 g/dL (ref 12.0–15.0)
MCH: 30.8 pg (ref 26.0–34.0)
MCHC: 35.3 g/dL (ref 30.0–36.0)
MCV: 87.2 fL (ref 80.0–100.0)
Platelets: 223 10*3/uL (ref 150–400)
RBC: 4.52 MIL/uL (ref 3.87–5.11)
RDW: 13.3 % (ref 11.5–15.5)
WBC: 19.9 10*3/uL — ABNORMAL HIGH (ref 4.0–10.5)
nRBC: 0 % (ref 0.0–0.2)

## 2020-03-22 LAB — GLUCOSE, CAPILLARY
Glucose-Capillary: 254 mg/dL — ABNORMAL HIGH (ref 70–99)
Glucose-Capillary: 219 mg/dL — ABNORMAL HIGH (ref 70–99)
Glucose-Capillary: 259 mg/dL — ABNORMAL HIGH (ref 70–99)
Glucose-Capillary: 232 mg/dL — ABNORMAL HIGH (ref 70–99)

## 2020-03-22 LAB — PROTEIN ELECTROPHORESIS, SERUM
A/G Ratio: 0.8 (ref 0.7–1.7)
Albumin ELP: 2.7 g/dL — ABNORMAL LOW (ref 2.9–4.4)
Alpha-1-Globulin: 0.2 g/dL (ref 0.0–0.4)
Alpha-2-Globulin: 0.6 g/dL (ref 0.4–1.0)
Beta Globulin: 0.9 g/dL (ref 0.7–1.3)
Gamma Globulin: 1.4 g/dL (ref 0.4–1.8)
Globulin, Total: 3.2 g/dL (ref 2.2–3.9)
Total Protein ELP: 5.9 g/dL — ABNORMAL LOW (ref 6.0–8.5)

## 2020-03-22 LAB — SURGICAL PATHOLOGY

## 2020-03-22 LAB — ALDOLASE: Aldolase: 20.4 U/L — ABNORMAL HIGH (ref 3.3–10.3)

## 2020-03-22 LAB — LACTATE DEHYDROGENASE: LDH: 342 U/L — ABNORMAL HIGH (ref 98–192)

## 2020-03-22 MED ORDER — INSULIN GLARGINE 100 UNIT/ML ~~LOC~~ SOLN
10.0000 [IU] | Freq: Every day | SUBCUTANEOUS | Status: DC
  Administered 2020-03-22: 10 [IU] via SUBCUTANEOUS
  Filled 2020-03-22: qty 0.1

## 2020-03-22 MED ORDER — HYDROCODONE-ACETAMINOPHEN 5-325 MG PO TABS
1.0000 | ORAL_TABLET | ORAL | Status: DC | PRN
  Administered 2020-03-22 – 2020-03-31 (×19): 1 via ORAL
  Filled 2020-03-22 (×20): qty 1

## 2020-03-22 NOTE — Progress Notes (Signed)
PROGRESS NOTE    Kaitlin Holloway  QZE:[id number] DOB: [birth year] DOA: 03/15/2020 PCP: Mack Hook, MD   Chief Complaint  Patient presents with  . Joint Pain    Brief Narrative: 49 year old female with past medical history of T2DM, last hemoglobin A1c around 211 5 days ago, has not been taking her medication, comes to the ED for evaluation of shortness of breath. As per report [name] reported she thinks she contracted Covid March 2020,she  Reported she was in perfect health condition doing regular exercise a few times a week prior to that.However after March 2020 she started to have diffuse joint pain muscle aches,weakness, she has been taking ibuprofen 1-3 times per day for more than a year now, she noticed she is getting slower, and weaker she cannot make a fist due to stiffness and weakness, she will report proximal muscle weakness is harder for her to raise arms , harder for her to get from sitting position to stand up position .she presented to the hospital due to significant upper back pain to the point she cannot breath. Patient had managed her diabetes with yoga and dietary changes due to mistrust of the medical system. Patient has gone extensive evaluation in house since admission with RPR HIV negative, CK slightly elevated 1219 admission, improved to 360, CT chest abdomen pelvis, no PE, no acute infection, showed splenomegaly, skeletal muscle soft tissue stranding abnormal edema compatible with generalized myositis, L5-S1 disc space narrowing and endplate spurring, underwent MRI T-spine and L-spine no evidence of lumbar spine infection, some disc and facet degeneration.  Seen by neurology, hematology and undergoing rheumatologic work-up.  Her primary issue appears to be joint rather than muscle weakness, multiple bilateral symmetrical and small joints involved.  Subjective: Patient reports she is feeling fine, does have joint weakness and thigh weakness with movement and  ambulation. Breathing comfortably. Had biopsy bone marrow yesterday  Assessment & Plan:  Generalized body weight with polyarthralgia, bilateral ankle swelling, stiffness in the hands and proximal muscle weakness, small joint tenderness and tenderness on upper thoracic spine: Extensive work-up as above with CT spine, MRI spine, see report for details.  No PE on CT, no DVT on duplex, bilateral ankle x-ray with degenerative changes, lab with mild rhabdomyolysis, significantly elevated rheumatoid factor greater than 650, elevated CCP 176, mildly elevated ESR and CRP, ANA negative, anti-Jo 1 negative CRP  3.5>2.7, ESR 28>35. LDH 385> 360. rpr neg, uric acid 4.1 nl, cytology no eosinophilia,Anti Jo av Ig < 0.2, CCP IGA/IGA- 176, RF > 650, ANA NEG, CK 1219> 773. Dr Erlinda Hong- " discussed with  [facility] who initially recommend transfer, however, [facility] is at capacity, no bed, rheumatology recommend consult neurology and hematology here and advised holding steroids until further work up,bilateral hand and foot x ray ordered per rheumatology recommendation which showed no evidence of arthropathy.  Seen by neurology and hematology. ? 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 Complains of ongoing pain, she has been on fentanyl IV. We discussed about switching her to p.o. hydrocodone. Seen by neurology appreciate input, neurology has ordered [facility] myositis panel for outpatient follow-up, aldolase, SPEP/UPEP IN PROCESS, B12 for neuropathy work-up and EMG NCS will be as an outpatient outpatient.hep panel negative.  Dyspnea on admission, currently improved.  CTA chest no acute finding.  Echocardiogram with a small pericardial effusion otherwise unremarkable. Currently doing well on room air  SVT ? nonsustained initially with activity not sure if patient had tachycardia during dyspnea episode.TSH  is stable. Continue Lopressor.  Non-insulin dependent diabetes mellitus,  uncontrolled with hyperglycemia, poorly controlled hemoglobin A1c 9.7.  Not taking any medication at home. Blood sugar poorly controlled we will add Lantus 10 units nightly, continue Premeal 3 units NovoLog 3 times daily. Patient agreed for glipizide at discharge.She has neuropathy. She will need ophthalmology outpatient follow-up Recent Labs  Lab 03/21/20 0735 03/21/20 1140 03/21/20 1728 03/21/20 2149 03/22/20 0746  GLUCAP 217* 193* 211* 203* 254*   Recent history of cataract surgery on right eye.  Leukocytosis persistent, she has been afebrile. Suspect due to #1. procal was 0.1 normal on 03/16/20  Hypertension not on medication at home. Continue losartan and low-dose metoprolol. Blood pressure fairly stable. 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  Patient made a comment to Dr Curly Shores " if we didn't figure it out soon she wanted to just die" Neurology was consulted and currently denies suicidal ideation and has been cleared.  Spoke with [facility]- they do not have any bed yet and not accepted for transfer. Original transfer RN was [facility]. Spoke w/ Liliane Channel. Dr Kim/Dr XIDHW on call on 11/25  Nutrition: Diet Order            Diet regular Room service appropriate? Yes; Fluid consistency: Thin  Diet effective now                 Body mass index is 28.25 kg/m.  DVT prophylaxis: enoxaparin (LOVENOX) injection 40 mg Start: 03/22/20 1000 Place and maintain sequential compression device Start: 03/17/20 1714 Code Status:   Code Status: Full Code  Family Communication: plan of care discussed with patient at bedside.  Status is: Inpatient Remains inpatient appropriate because:Ongoing diagnostic testing needed not appropriate for outpatient work up and Inpatient level of care appropriate due to severity of illness  Dispo: The patient is from: Home              Anticipated d/c is to: Home.               Anticipated d/c date is: 3 days              Patient currently is not medically stable to d/c.  Consultants:see note  Procedures:see note  Culture/Microbiology    Component Value Date/Time   SDES  03/19/2020 1647    BLOOD LEFT ANTECUBITAL Performed at [facility], [place] [address] [place], [place] [address]    SPECREQUEST  03/19/2020 1647    BOTTLES DRAWN AEROBIC ONLY Blood Culture adequate volume Performed at [facility] [address], [place], [place] [address]    CULT  03/19/2020 1647    NO GROWTH 3 DAYS Performed at [facility] [address], [place], [place] [address]    REPTSTATUS PENDING 03/19/2020 1647    Other culture-see note  Medications: Scheduled Meds: . enoxaparin (LOVENOX) injection  40 mg Subcutaneous Q24H  . insulin aspart  0-9 Units Subcutaneous TID WC  . insulin aspart  3 Units Subcutaneous TID WC  . losartan  25 mg Oral Daily  . melatonin  3 mg Oral QHS  . metoprolol tartrate  12.5 mg Oral BID  . nystatin   Topical BID  . senna-docusate  1 tablet Oral BID   Continuous Infusions:  Antimicrobials: Anti-infectives (From admission, onward)   Start     Dose/Rate Route Frequency Ordered Stop   03/16/20 0415  doxycycline (VIBRAMYCIN) 100 mg in sodium chloride 0.9 % 250 mL IVPB        100 mg 125 mL/hr over 120 Minutes Intravenous  Once 03/16/20 0407 03/16/20 0637     Objective: Vitals: Today's Vitals   03/22/20 0615 03/22/20 0616 03/22/20 0737 03/22/20 1012  BP: 131/75   122/72  Pulse: 75   83  Resp: 14     Temp: 98.3 F (36.8 C)     TempSrc: Oral     SpO2: 97%     Weight:      Height:      PainSc:  Asleep 5      Intake/Output Summary (Last 24 hours) at 03/22/2020 1540 Last data filed at 03/21/2020 2100 Gross per 24 hour  Intake --  Output 400 ml  Net -400 ml   Filed Weights   03/16/20 0539  Weight: 99.8 kg   Weight change:   Intake/Output from previous day: 11/29 0701 - 11/30  0700 In: -  Out: 700 [Urine:700] Intake/Output this shift: No intake/output data recorded.  Examination: General exam: AAOx3 , NAD, weak appearing. HEENT:Oral mucosa moist, Ear/Nose WNL grossly, dentition normal. Respiratory system: bilaterally clear,no wheezing or crackles,no use of accessory muscle Cardiovascular system: S1 & S2 +, No JVD,. Gastrointestinal system: Abdomen soft, NT,ND, BS+ Nervous System:Alert, awake, moving extremities and grossly nonfocal Extremities: No edema, distal peripheral pulses palpable. Tenderness in small joints of wrist elbow Skin: No rashes,no icterus. MSK: Normal muscle bulk,tone, power   Data Reviewed: I have personally reviewed following labs and imaging studies CBC: Recent Labs  Lab 03/16/20 0004 03/16/20 0004 03/16/20 0810 03/17/20 0500 03/18/20 0541 03/21/20 0727 03/22/20 0635  WBC 19.2*   < > 18.2* 18.2* 18.2* 18.7* 19.9*  NEUTROABS 4.9  --  3.7 3.1 3.6 3.4  --   HGB 13.8   < > 13.3 13.1 13.9 14.0 13.9  HCT 38.9   < > 38.1 37.1 39.8 39.4 39.4  MCV 84.9   < > 87.2 86.7 86.9 86.6 87.2  PLT 214   < > 187 208 206 213 223   < > = values in this interval not displayed.   Basic Metabolic Panel: Recent Labs  Lab 03/16/20 0810 03/17/20 0500 03/18/20 0541 03/21/20 0727 03/22/20 0635  NA 134* 133* 134* 135 134*  K 3.8 3.9 3.8 4.1 4.4  CL 102 100 102 101 101  CO2 _0 GLUCOSE 237* 282* 189* 225* 220*  BUN _1 CREATININE 0.70 0.73 0.86 0.77 0.82  CALCIUM 8.3* 8.3* 8.7* 8.9 8.9  MG  --  1.8  --   --   --    GFR: Estimated Creatinine Clearance: 112.1 mL/min (by C-G formula based on SCr of 0.82 mg/dL). Liver Function Tests: Recent Labs  Lab 03/16/20 0004 03/16/20 0810 03/21/20 0727 03/22/20 0635  AST 38 _2 ALT 44 34 22 19  ALKPHOS 107 82 75 71  BILITOT 0.9 0.7 0.6 0.9  PROT 6.6 5.6* 6.5 6.4*  ALBUMIN 3.1* 2.8* 3.2* 3.1*   No results for input(s): LIPASE, AMYLASE in the last 168 hours. No  results for input(s): AMMONIA in the last 168 hours. Coagulation Profile: No results for input(s): INR, PROTIME in the last 168 hours. Cardiac Enzymes: Recent Labs  Lab 03/16/20 0004 03/16/20 0810 03/18/20 0541 03/21/20 0727  CKTOTAL 1,219* 773* 360* 428*   BNP (last 3 results) No results for input(s): PROBNP in the last  8760 hours. HbA1C: No results for input(s): HGBA1C in the last 72 hours. CBG: Recent Labs  Lab 03/21/20 0735 03/21/20 1140 03/21/20 1728 03/21/20 2149 03/22/20 0746  GLUCAP 217* 193* 211* 203* 254*   Lipid Profile: No results for input(s): CHOL, HDL, LDLCALC, TRIG, CHOLHDL, LDLDIRECT in the last 72 hours. Thyroid Function Tests: No results for input(s): TSH, T4TOTAL, FREET4, T3FREE, THYROIDAB in the last 72 hours. Anemia Panel: Recent Labs    03/21/20 0727  VITAMINB12 473   Sepsis Labs: Recent Labs  Lab 03/16/20 0810  PROCALCITON 0.10    Recent Results (from the past 240 hour(s))  Resp Panel by RT-PCR (Flu A&B, Covid) Nasopharyngeal Swab     Status: None   Collection Time: 03/16/20 12:04 AM   Specimen: Nasopharyngeal Swab; Nasopharyngeal(NP) swabs in vial transport medium  Result Value Ref Range Status   SARS Coronavirus 2 by RT PCR NEGATIVE NEGATIVE Final    Comment: (NOTE) SARS-CoV-2 target nucleic acids are NOT DETECTED.  The SARS-CoV-2 RNA is generally detectable in upper respiratory specimens during the acute phase of infection. The lowest concentration of SARS-CoV-2 viral copies this assay can detect is 138 copies/mL. A negative result does not preclude SARS-Cov-2 infection and should not be used as the sole basis for treatment or other patient management decisions. A negative result may occur with  improper specimen collection/handling, submission of specimen other than nasopharyngeal swab, presence of viral mutation(s) within the areas targeted by this assay, and inadequate number of viral copies(<138 copies/mL). A negative result  must be combined with clinical observations, patient history, and epidemiological information. The expected result is Negative.  Fact Sheet for Patients:  EntrepreneurPulse.com.au  Fact Sheet for Healthcare Providers:  IncredibleEmployment.be  This test is no t yet approved or cleared by the [place] FDA and  has been authorized for detection and/or diagnosis of SARS-CoV-2 by FDA under an Emergency Use Authorization (EUA). This EUA will remain  in effect (meaning this test can be used) for the duration of the COVID-19 declaration under Section 564(b)(1) of the Act, 21 U.S.C.section 360bbb-3(b)(1), unless the authorization is terminated  or revoked sooner.       Influenza A by PCR NEGATIVE NEGATIVE Final   Influenza B by PCR NEGATIVE NEGATIVE Final    Comment: (NOTE) The Xpert Xpress SARS-CoV-2/FLU/RSV plus assay is intended as an aid in the diagnosis of influenza from Nasopharyngeal swab specimens and should not be used as a sole basis for treatment. Nasal washings and aspirates are unacceptable for Xpert Xpress SARS-CoV-2/FLU/RSV testing.  Fact Sheet for Patients: EntrepreneurPulse.com.au  Fact Sheet for Healthcare Providers: IncredibleEmployment.be  This test is not yet approved or cleared by the [place] FDA and has been authorized for detection and/or diagnosis of SARS-CoV-2 by FDA under an Emergency Use Authorization (EUA). This EUA will remain in effect (meaning this test can be used) for the duration of the COVID-19 declaration under Section 564(b)(1) of the Act, 21 U.S.C. section 360bbb-3(b)(1), unless the authorization is terminated or revoked.  Performed at [facility], [place] [address], [place], [place] [address]   Culture, blood (routine x 2)     Status: None   Collection Time: 03/16/20  7:55 AM   Specimen: BLOOD  Result Value Ref Range Status   Specimen  Description   Final    BLOOD LEFT ANTECUBITAL Performed at [facility] [address], [place],  [address]    Special Requests   Final    BOTTLES  DRAWN AEROBIC ONLY Blood Culture adequate volume Performed at [facility] [address], [place], [place] [address]    Culture   Final    NO GROWTH 5 DAYS Performed at [facility], [place] [address] [place], [place] [address]    Report Status 2020-04-06 FINAL  Final  Culture, blood (routine x 2)     Status: None   Collection Time: 03/16/20  8:10 AM   Specimen: BLOOD  Result Value Ref Range Status   Specimen Description   Final    BLOOD LEFT ANTECUBITAL Performed at [facility] [address], [place], [place] [address]    Special Requests   Final    BOTTLES DRAWN AEROBIC ONLY Blood Culture results may not be optimal due to an inadequate volume of blood received in culture bottles Performed at [facility] [address], [place], [place] [address]    Culture   Final    NO GROWTH 5 DAYS Performed at [facility], [place] [address] [place], [place] [address]    Report Status 04-06-2020 FINAL  Final  Culture, blood (routine x 2)     Status: None (Preliminary result)   Collection Time: 03/19/20  4:39 PM   Specimen: BLOOD  Result Value Ref Range Status   Specimen Description   Final    BLOOD LEFT ANTECUBITAL Performed at [facility] [address], [place], [place] [address]    Special Requests   Final    BOTTLES DRAWN AEROBIC ONLY Blood Culture adequate volume Performed at [facility] [address] [place], [place] [address]    Culture   Final    NO GROWTH 3 DAYS Performed at [facility], [place] [address], [place], [place] [address]    Report Status PENDING  Incomplete  Culture, blood (routine x 2)     Status: None (Preliminary result)   Collection Time: 03/19/20  4:47 PM    Specimen: BLOOD  Result Value Ref Range Status   Specimen Description   Final    BLOOD LEFT ANTECUBITAL Performed at [facility] [address] [place], [place] [address]    Special Requests   Final    BOTTLES DRAWN AEROBIC ONLY Blood Culture adequate volume Performed at [facility] [address] [place], [place] [address]    Culture   Final    NO GROWTH 3 DAYS Performed at [facility], [place] [address] [place], [place] [address]    Report Status PENDING  Incomplete     Radiology Studies: CT BIOPSY  Result Date: 04-06-20 INDICATION: Eosinophilia of uncertain etiology. Please perform CT-guided bone marrow biopsy tissue diagnostic purposes, including cytogenetics. EXAM: CT-GUIDED BONE MARROW BIOPSY AND ASPIRATION MEDICATIONS: None ANESTHESIA/SEDATION: None COMPLICATIONS: None immediate. PROCEDURE: Informed consent was obtained from the patient following an explanation of the procedure, risks, benefits and alternatives. The patient understands, agrees and consents for the procedure. All questions were addressed. A time out was performed prior to the initiation of the procedure. The patient was positioned prone and non-contrast localization CT was performed of the pelvis to demonstrate the iliac marrow spaces. The operative site was prepped and draped in the usual sterile fashion. Under sterile conditions and local anesthesia, a 22 gauge spinal needle was utilized for procedural planning. Next, an 11 gauge coaxial bone biopsy needle was advanced into the left iliac marrow space. Needle position was confirmed with CT  imaging. Initially, a bone marrow aspiration was performed. Next, a bone marrow biopsy was obtained with the 11 gauge outer bone marrow device. Samples were prepared with the cytotechnologist and deemed adequate. The needle was removed and superficial hemostasis was obtained with manual compression. A dressing was applied. The patient  tolerated the procedure well without immediate post procedural complication. IMPRESSION: Successful CT guided left iliac bone marrow aspiration and core biopsy. Electronically Signed   By: Sandi Mariscal M.D.   On: 03/21/2020 13:53   CT BONE MARROW BIOPSY & ASPIRATION  Result Date: 03/21/2020 INDICATION: Eosinophilia of uncertain etiology. Please perform CT-guided bone marrow biopsy tissue diagnostic purposes, including cytogenetics. EXAM: CT-GUIDED BONE MARROW BIOPSY AND ASPIRATION MEDICATIONS: None ANESTHESIA/SEDATION: None COMPLICATIONS: None immediate. PROCEDURE: Informed consent was obtained from the patient following an explanation of the procedure, risks, benefits and alternatives. The patient understands, agrees and consents for the procedure. All questions were addressed. A time out was performed prior to the initiation of the procedure. The patient was positioned prone and non-contrast localization CT was performed of the pelvis to demonstrate the iliac marrow spaces. The operative site was prepped and draped in the usual sterile fashion. Under sterile conditions and local anesthesia, a 22 gauge spinal needle was utilized for procedural planning. Next, an 11 gauge coaxial bone biopsy needle was advanced into the left iliac marrow space. Needle position was confirmed with CT imaging. Initially, a bone marrow aspiration was performed. Next, a bone marrow biopsy was obtained with the 11 gauge outer bone marrow device. Samples were prepared with the cytotechnologist and deemed adequate. The needle was removed and superficial hemostasis was obtained with manual compression. A dressing was applied. The patient tolerated the procedure well without immediate post procedural complication. IMPRESSION: Successful CT guided left iliac bone marrow aspiration and core biopsy. Electronically Signed   By: Sandi Mariscal M.D.   On: 03/21/2020 13:53     LOS: 5 days   Antonieta Pert, MD [facility]  03/22/2020, 3:40  PM

## 2020-03-22 NOTE — Progress Notes (Signed)
Inpatient Diabetes Program Recommendations  AACE/ADA: New Consensus Statement on Inpatient Glycemic Control (2015)  Target Ranges:  Prepandial:   less than 140 mg/dL      Peak postprandial:   less than 180 mg/dL (1-2 hours)      Critically ill patients:  140 - 180 mg/dL   Lab Results  Component Value Date   GLUCAP 254 (H) 03/22/2020   HGBA1C 9.7 (H) 03/16/2020    Review of Glycemic Control  Diabetes history: DM2 Outpatient Diabetes medications: None Current orders for Inpatient glycemic control: Novolog 0-9 units tidwc + 3 units tidwc  HgbA1C - 9.7% Blood sugars > 200 mg/dL Would benefit from addition of basal insulin  Inpatient Diabetes Program Recommendations:  Add Lantus 10 units QHS  May need to be d/ced on insulin with HgbA1C of 9.7%.  Continue to follow.  Thank you. Ailene Ards, RD, LDN, CDE Inpatient Diabetes Coordinator [phone]

## 2020-03-23 DIAGNOSIS — R0602 Shortness of breath: Secondary | ICD-10-CM

## 2020-03-23 LAB — GLUCOSE, CAPILLARY
Glucose-Capillary: 239 mg/dL — ABNORMAL HIGH (ref 70–99)
Glucose-Capillary: 204 mg/dL — ABNORMAL HIGH (ref 70–99)
Glucose-Capillary: 102 mg/dL — ABNORMAL HIGH (ref 70–99)
Glucose-Capillary: 170 mg/dL — ABNORMAL HIGH (ref 70–99)

## 2020-03-23 LAB — COMPREHENSIVE METABOLIC PANEL
ALT: 18 U/L (ref 0–44)
AST: 17 U/L (ref 15–41)
Albumin: 3.2 g/dL — ABNORMAL LOW (ref 3.5–5.0)
Alkaline Phosphatase: 84 U/L (ref 38–126)
Anion gap: 8 (ref 5–15)
BUN: 25 mg/dL — ABNORMAL HIGH (ref 6–20)
CO2: 25 mmol/L (ref 22–32)
Calcium: 8.9 mg/dL (ref 8.9–10.3)
Chloride: 102 mmol/L (ref 98–111)
Creatinine, Ser: 0.81 mg/dL (ref 0.44–1.00)
GFR, Estimated: >60 mL/min (ref >60)
Glucose, Bld: 262 mg/dL — ABNORMAL HIGH (ref 70–99)
Potassium: 4.3 mmol/L (ref 3.5–5.1)
Sodium: 135 mmol/L (ref 135–145)
Total Bilirubin: 0.7 mg/dL (ref 0.3–1.2)
Total Protein: 6.5 g/dL (ref 6.5–8.1)

## 2020-03-23 LAB — CBC
HCT: 39.7 % (ref 36.0–46.0)
Hemoglobin: 14.1 g/dL (ref 12.0–15.0)
MCH: 31.1 pg (ref 26.0–34.0)
MCHC: 35.5 g/dL (ref 30.0–36.0)
MCV: 87.4 fL (ref 80.0–100.0)
Platelets: 232 10*3/uL (ref 150–400)
RBC: 4.54 MIL/uL (ref 3.87–5.11)
RDW: 13.4 % (ref 11.5–15.5)
WBC: 19.3 10*3/uL — ABNORMAL HIGH (ref 4.0–10.5)
nRBC: 0 % (ref 0.0–0.2)

## 2020-03-23 LAB — LACTATE DEHYDROGENASE: LDH: 326 U/L — ABNORMAL HIGH (ref 98–192)

## 2020-03-23 MED ORDER — INSULIN ASPART 100 UNIT/ML ~~LOC~~ SOLN
4.0000 [IU] | Freq: Three times a day (TID) | SUBCUTANEOUS | Status: DC
  Administered 2020-03-23 – 2020-03-29 (×15): 4 [IU] via SUBCUTANEOUS

## 2020-03-23 MED ORDER — INSULIN GLARGINE 100 UNIT/ML ~~LOC~~ SOLN
15.0000 [IU] | Freq: Every day | SUBCUTANEOUS | Status: DC
  Administered 2020-03-23 – 2020-03-27 (×5): 15 [IU] via SUBCUTANEOUS
  Filled 2020-03-23 (×6): qty 0.15

## 2020-03-23 MED ORDER — ENOXAPARIN SODIUM 40 MG/0.4ML ~~LOC~~ SOLN
40.0000 mg | SUBCUTANEOUS | Status: DC
  Administered 2020-03-26 – 2020-03-31 (×4): 40 mg via SUBCUTANEOUS
  Filled 2020-03-23 (×6): qty 0.4

## 2020-03-23 MED ORDER — GLIPIZIDE 5 MG PO TABS
10.0000 mg | ORAL_TABLET | Freq: Every day | ORAL | Status: DC
  Administered 2020-03-23 – 2020-03-28 (×5): 10 mg via ORAL
  Filled 2020-03-23 (×5): qty 2

## 2020-03-23 NOTE — Progress Notes (Signed)
PROGRESS NOTE    Kaitlin Holloway  VHQ:[id number] DOB: 1970/06/23 DOA: 03/15/2020 PCP: Mack Hook, MD   Chief Complaint  Patient presents with  . Joint Pain    Brief Narrative: 49 year old female with past medical history of T2DM, has not been taking her medication, comes to the ED for evaluation of shortness of breath/ joint pain. As per report Kaitlin Holloway reported she thinks she contracted Covid March 2020,she  Reported she was in perfect health condition doing regular exercise a few times a week prior to that.However after March 2020 she started to have diffuse joint pain muscle aches,weakness, she has been taking ibuprofen 1-3 times per day for more than a year now, she noticed she is getting slower, and weaker she cannot make a fist due to stiffness and weakness, she will report proximal muscle weakness is harder for her to raise arms , harder for her to get from sitting position to stand up position .she presented to the hospital due to significant upper back pain to the point she cannot breath. Patient had managed her diabetes with yoga and dietary changes due to mistrust of the medical system. 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 Seen by neurology, hematology and undergoing rheumatologic work-up:CRP 3.5>2.7, ESR 28>35. LDH 385> 360. rpr neg, uric acid 4.1 nl, cytology no eosinophilia,Anti Jo av Ig < 0.2, CCP IGA/IGA:176, RF > 650, ANA NEG, aldolase +20.4, SPEPE no monoclonal protein.  UPEP/UIFE/Light chain pending.Patient underwent bone  marrow biopsy after being seen by hematology due to persistent leukocytosis and eosinophilia-report is not conclusive as per hematology and not sure if it is primary I;e hypereosinophilic syndrome or reactive due to collagen vascular disease.  Subjective: Reports pain is the same mostly with moving affecting the small joints of the hand/elbow.  Assessment & Plan:  Generalized body weight with polyarthralgia, bilateral ankle swelling, stiffness in the hands and proximal muscle weakness, small joint tenderness and tenderness on upper thoracic spine: Patient reports he feels 70% better.  He has no swelling in her hands or joints had some tenderness in the bilateral wrist but tender mostly with active movement of the shoulder elbow. Some tenderness on upper back. Underwent extensive work-up as above with CT spine, MRI spine, see report for details.  No PE on CT, no DVT on duplex, bilateral ankle x-ray with degenerative changes, lab with mild rhabdomyolysis. 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 MRI lumbar spine-disc and facet degeneration.labs-mildly elevated ESR and CRP, ANA negative, anti-Jo 1 negative CRP  3.5>2.7,ESR mildly elevated  28>35. LDH 385> 360. rpr neg, uric acid 4.1 nl, cytology no eosinophilia,CCP IGA/IGA:176, RF > 650,CK 1219> 773>400, aldolase 20.4. Dr Erlinda Hong- " discussed with  [facility] who initially recommend transfer, however, [facility] is at capacity, no bed, rheumatology recommend consult neurology and hematology here and advised holding steroids until further work up,bilateral hand and foot x ray -showed no evidence of arthropathy.  Seen by neurology and hematology. ? 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 Seen by neurology appreciate input, neurology has ordered [facility] myositis panel for outpatient follow-up, aldolase +, SPEP negative per monoclonal protein/UPEP IN PROCESS, B12 for neuropathy work-up normal and will need EMG NCS will be as an outpatient.hep panel negative.  Discussed with Dr. Marin Olp- ?steroid trial.I called [facility] line [phone] Who paged Dr Jannifer Rodney Rheum on call and I discussed with him ( he was also involved with Dr Phoebe Sharps first call) -advised to check MRI of muscle and biopsy the muscle ( for myositis) then can consider trial of steroid 1 mg/kg, requested him to schedule follow up- will need to send order for referral at [phone]. There is no bed at [facility] for transfer and I am not sure if she really needs inpatient transfer and per Dr Jannifer Rodney likely does not need inpatient transfer. IR consulted- contact- informed by IR to contact Dr Lyman Bishop in am at  [phone] or pager [phone]., ?mri femur to eval for myositis .  Deconditioning from #1: PT/OT woring with her.  She is demonstrating transfers modified independent level/bed elevated, has been ambulating in the room independently and in the hallway with supervision but has decreased range of motion with joint pain throughout and also from right knee injury.  Dyspnea on admission, currently improved.  CTA chest no acute finding.  Echocardiogram with a small pericardial effusion otherwise unremarkable.  No shortness of breath or chest pain.    SVT ? nonsustained initially with activity not sure if patient had tachycardia during dyspnea episode.TSH is stable.Continue Lopressor.  Non-insulin dependent diabetes mellitus, blood sugar uncontrolled increase Lantus further, start on glipizide 10 mg, continue Premeal insulin and sliding scale.Patient is reluctant to be on insulin but agreeable to start oral medication.  I am afraid she may need to go home on insulin injection especially if she requires steroid for  rheumatoid Recent Labs  Lab 03/22/20 1239 03/22/20 1737 03/22/20 2122 03/23/20 0743 03/23/20 1229  GLUCAP 219* 259* 232* 239* 204*   Recent history of cataract surgery on right eye.  Leukocytosis persistent with Eosinphil 13.0,she has been afebrile.BM biopsy"Normocellular bone marrow for age with prominent eosinophilic Proliferation".procal was 0.1 normal on 03/16/20, no fever. Recent Labs  Lab 03/17/20 0500 03/18/20 0541 03/21/20 0727 03/22/20 0635 03/23/20 0624  WBC 18.2* 18.2* 18.7* 19.9* 19.3*   Hypertension:not on medication at home.BP controlled on on losartan and low-dose metoprolol. 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   Patient made a comment to Dr Maxie Better we didn't figure it out soon she wanted to just die" Neurology was consulted and currently denies suicidal ideation and has been cleared.  Spoke with [facility] do not have any bed yet and is not accepted for transfer.Original transfer RN was [facility].Spoke w/ Liliane Channel 11/30. Dr Jannifer Rodney on call on 11/25 for Dr Erlinda Hong.  Nutrition: Diet Order            Diet regular Room service appropriate? Yes; Fluid consistency: Thin  Diet effective now                 Body mass index is 28.25 kg/m.  DVT prophylaxis: enoxaparin (LOVENOX) injection 40 mg Start: 03/22/20 1000 Place and maintain sequential compression device Start: 03/17/20 1714 Code Status:   Code Status: Full Code  Family Communication: plan of care discussed with patient at bedside.  Status  is: Inpatient Remains inpatient appropriate because:Ongoing diagnostic testing needed not appropriate for outpatient work up and Inpatient level of care appropriate due to severity of illness  Dispo: The patient is from: Home              Anticipated d/c is to: Home.              Anticipated d/c date is: 3 days              Patient currently is not medically  stable to d/c.  Consultants:see note  Procedures:see note  Culture/Microbiology    Component Value Date/Time   SDES  03/19/2020 1647    BLOOD LEFT ANTECUBITAL Performed at [facility], [place] [address] [place], [place] [address]    SPECREQUEST  03/19/2020 1647    BOTTLES DRAWN AEROBIC ONLY Blood Culture adequate volume Performed at [facility] [address] [place], [place] [address]    CULT  03/19/2020 1647    NO GROWTH 4 DAYS Performed at [facility] [address], [place], [place] [address]    REPTSTATUS PENDING 03/19/2020 1647    Other culture-see note  Medications: Scheduled Meds: . enoxaparin (LOVENOX) injection  40 mg Subcutaneous Q24H  . glipiZIDE  10 mg Oral QAC breakfast  . insulin aspart  0-9 Units Subcutaneous TID WC  . insulin aspart  4 Units Subcutaneous TID WC  . insulin glargine  15 Units Subcutaneous QHS  . losartan  25 mg Oral Daily  . melatonin  3 mg Oral QHS  . metoprolol tartrate  12.5 mg Oral BID  . nystatin   Topical BID  . senna-docusate  1 tablet Oral BID   Continuous Infusions:  Antimicrobials: Anti-infectives (From admission, onward)   Start     Dose/Rate Route Frequency Ordered Stop   03/16/20 0415  doxycycline (VIBRAMYCIN) 100 mg in sodium chloride 0.9 % 250 mL IVPB        100 mg 125 mL/hr over 120 Minutes Intravenous  Once 03/16/20 0407 03/16/20 0637    Objective: Vitals: Today's Vitals   03/23/20 0541 03/23/20 0827 03/23/20 1018 03/23/20 1118  BP: 126/75  137/86   Pulse: 76  85   Resp: 14     Temp: 98.4 F (36.9 C)     TempSrc: Oral     SpO2: 99%     Weight:      Height:      PainSc:  5   3    No intake or output data in the 24 hours ending 03/23/20 1429 Filed Weights   03/16/20 0539  Weight: 99.8 kg   Weight change:   Intake/Output from previous day: 11/30 0701 - 12/01 0700 In: 480 [P.O.:480] Out: -  Intake/Output this shift: No intake/output data  recorded.  Examination: General exam: AAOx3 , NAD, weak appearing. HEENT:Oral mucosa moist, Ear/Nose WNL grossly, dentition normal. Respiratory system: bilaterally clear,no wheezing or crackles,no use of accessory muscle Cardiovascular system: S1 & S2 +, No JVD,. Gastrointestinal system: Abdomen soft, NT,ND, BS+ Nervous System:Alert, awake, moving extremities and grossly nonfocal Extremities: No edema, distal peripheral pulses palpable.Tenderness in small joints of wrist elbow, poor grip, able to lift/raise upper arms-power 4/5.  Non-tender thigh. Skin: No rashes,no icterus. MSK: Normal muscle bulk,tone, power  Data Reviewed: I have personally reviewed following labs and imaging studies CBC: Recent Labs  Lab 03/17/20 0500 03/18/20 0541 03/21/20 0727 03/22/20 0635 03/23/20 0624  WBC 18.2* 18.2* 18.7* 19.9* 19.3*  NEUTROABS 3.1 3.6 3.4  --   --   HGB 13.1 13.9 14.0 13.9 14.1  HCT 37.1 39.8 39.4 39.4 39.7  MCV 86.7 86.9 86.6 87.2 87.4  PLT 208 206 213 223 858   Basic Metabolic Panel: Recent Labs  Lab 03/17/20 0500 03/18/20 0541 03/21/20 0727 03/22/20 0635 03/23/20 0624  NA 133* 134* 135 134* 135  K 3.9 3.8 4.1 4.4 4.3  CL 100 102 101 101 102  CO2 '26 23 24 24 25  ' GLUCOSE 282* 189* 225* 220* 262*  BUN '14 15 19 19 ' 25*  CREATININE 0.73 0.86 0.77 0.82 0.81  CALCIUM 8.3* 8.7* 8.9 8.9 8.9  MG 1.8  --   --   --   --    GFR: Estimated Creatinine Clearance: 113.5 mL/min (by C-G formula based on SCr of 0.81 mg/dL). Liver Function Tests: Recent Labs  Lab 03/21/20 0727 03/22/20 0635 03/23/20 0624  AST '21 17 17  ' ALT '22 19 18  ' ALKPHOS 75 71 84  BILITOT 0.6 0.9 0.7  PROT 6.5 6.4* 6.5  ALBUMIN 3.2* 3.1* 3.2*   No results for input(s): LIPASE, AMYLASE in the last 168 hours. No results for input(s): AMMONIA in the last 168 hours. Coagulation Profile: No results for input(s): INR, PROTIME in the last 168 hours. Cardiac Enzymes: Recent Labs  Lab 03/18/20 0541  03/21/20 0727  CKTOTAL 360* 428*   BNP (last 3 results) No results for input(s): PROBNP in the last 8760 hours. HbA1C: No results for input(s): HGBA1C in the last 72 hours. CBG: Recent Labs  Lab 03/22/20 1239 03/22/20 1737 03/22/20 2122 03/23/20 0743 03/23/20 1229  GLUCAP 219* 259* 232* 239* 204*   Lipid Profile: No results for input(s): CHOL, HDL, LDLCALC, TRIG, CHOLHDL, LDLDIRECT in the last 72 hours. Thyroid Function Tests: No results for input(s): TSH, T4TOTAL, FREET4, T3FREE, THYROIDAB in the last 72 hours. Anemia Panel: Recent Labs    03/21/20 0727  VITAMINB12 473   Sepsis Labs: No results for input(s): PROCALCITON, LATICACIDVEN in the last 168 hours.  Recent Results (from the past 240 hour(s))  Resp Panel by RT-PCR (Flu A&B, Covid) Nasopharyngeal Swab     Status: None   Collection Time: 03/16/20 12:04 AM   Specimen: Nasopharyngeal Swab; Nasopharyngeal(NP) swabs in vial transport medium  Result Value Ref Range Status   SARS Coronavirus 2 by RT PCR NEGATIVE NEGATIVE Final    Comment: (NOTE) SARS-CoV-2 target nucleic acids are NOT DETECTED.  The SARS-CoV-2 RNA is generally detectable in upper respiratory specimens during the acute phase of infection. The lowest concentration of SARS-CoV-2 viral copies this assay can detect is 138 copies/mL. A negative result does not preclude SARS-Cov-2 infection and should not be used as the sole basis for treatment or other patient management decisions. A negative result may occur with  improper specimen collection/handling, submission of specimen other than nasopharyngeal swab, presence of viral mutation(s) within the areas targeted by this assay, and inadequate number of viral copies(<138 copies/mL). A negative result must be combined with clinical observations, patient history, and epidemiological information. The expected result is Negative.  Fact Sheet for Patients:  EntrepreneurPulse.com.au  Fact  Sheet for Healthcare Providers:  IncredibleEmployment.be  This test is no t yet approved or cleared by the [place] FDA and  has been authorized for detection and/or diagnosis of SARS-CoV-2 by FDA under an Emergency Use Authorization (EUA). This EUA will remain  in effect (meaning this test can be used) for the duration of the COVID-19  declaration under Section 564(b)(1) of the Act, 21 U.S.C.section 360bbb-3(b)(1), unless the authorization is terminated  or revoked sooner.       Influenza A by PCR NEGATIVE NEGATIVE Final   Influenza B by PCR NEGATIVE NEGATIVE Final    Comment: (NOTE) The Xpert Xpress SARS-CoV-2/FLU/RSV plus assay is intended as an aid in the diagnosis of influenza from Nasopharyngeal swab specimens and should not be used as a sole basis for treatment. Nasal washings and aspirates are unacceptable for Xpert Xpress SARS-CoV-2/FLU/RSV testing.  Fact Sheet for Patients: EntrepreneurPulse.com.au  Fact Sheet for Healthcare Providers: IncredibleEmployment.be  This test is not yet approved or cleared by the [place] FDA and has been authorized for detection and/or diagnosis of SARS-CoV-2 by FDA under an Emergency Use Authorization (EUA). This EUA will remain in effect (meaning this test can be used) for the duration of the COVID-19 declaration under Section 564(b)(1) of the Act, 21 U.S.C. section 360bbb-3(b)(1), unless the authorization is terminated or revoked.  Performed at [facility], [place] [address] [place], [place] [address]   Culture, blood (routine x 2)     Status: None   Collection Time: 03/16/20  7:55 AM   Specimen: BLOOD  Result Value Ref Range Status   Specimen Description   Final    BLOOD LEFT ANTECUBITAL Performed at [facility] [address], [place], [place] [address]    Special Requests   Final    BOTTLES DRAWN AEROBIC ONLY Blood Culture  adequate volume Performed at [facility] [address], [place], [place] [address]    Culture   Final    NO GROWTH 5 DAYS Performed at [facility], [place] [address], [place], [place] [address]    Report Status 03/21/2020 FINAL  Final  Culture, blood (routine x 2)     Status: None   Collection Time: 03/16/20  8:10 AM   Specimen: BLOOD  Result Value Ref Range Status   Specimen Description   Final    BLOOD LEFT ANTECUBITAL Performed at [facility] [address], [place], [place] [address]    Special Requests   Final    BOTTLES DRAWN AEROBIC ONLY Blood Culture results may not be optimal due to an inadequate volume of blood received in culture bottles Performed at [facility] [address], [place], [place] [address]    Culture   Final    NO GROWTH 5 DAYS Performed at [facility], [place] [address] [place], [place] [address]    Report Status 03/21/2020 FINAL  Final  Culture, blood (routine x 2)     Status: None (Preliminary result)   Collection Time: 03/19/20  4:39 PM   Specimen: BLOOD  Result Value Ref Range Status   Specimen Description   Final    BLOOD LEFT ANTECUBITAL Performed at [facility] [address], [place], [place] [address]    Special Requests   Final    BOTTLES DRAWN AEROBIC ONLY Blood Culture adequate volume Performed at [facility] [address], [place], [place] [address]    Culture   Final    NO GROWTH 4 DAYS Performed at [facility], [place] [address], [place], [place] [address]    Report Status PENDING  Incomplete  Culture, blood (routine x 2)     Status: None (Preliminary result)   Collection Time: 03/19/20  4:47 PM   Specimen: BLOOD  Result  Value Ref Range Status   Specimen Description   Final    BLOOD LEFT ANTECUBITAL Performed at [facility] [address] [place], [place] [address]    Special Requests    Final    BOTTLES DRAWN AEROBIC ONLY Blood Culture adequate volume Performed at [facility] [address], [place], [place] [address]    Culture   Final    NO GROWTH 4 DAYS Performed at [facility], [place] [address], [place], [place] [address]    Report Status PENDING  Incomplete     Radiology Studies: No results found.   LOS: 6 days   Antonieta Pert, MD [facility]  03/23/2020, 2:29 PM

## 2020-03-23 NOTE — Progress Notes (Signed)
Physical Therapy Treatment Patient Details Name: Kaitlin Holloway MRN: [id number] DOB: 01/08/1971 Today's Date: 03/23/2020    History of Present Illness Pt is 49 y.o. female with PMH of COVID 19, DM, HTN, and chronic polyarthralgia presents to the emergency department with continued joint pain but new onset difficulty breathing. Pt stated joint pain started after likely covid infection in March 2020.  Pt with underwent bone biopsy Monday 03/21/20.    PT Comments    Patient progressing steadily with acute PT and is mobilizing at supervision to mod independent level for transfers and gait. She is limited by pain and continues to demonstrate mild gait impairments due to knee pain. Patient increased ambulation distance with no c/o pain exacerbation and was educated on benefits of mobilizing throughout the day. She was instructed on HEP and provided handout to continue progressing strength on her own. Patient will benefit from OPPT follow up.    Follow Up Recommendations  Outpatient PT     Equipment Recommendations  None recommended by PT    Recommendations for Other Services       Precautions / Restrictions Precautions Precautions: None Restrictions Weight Bearing Restrictions: No    Mobility  Bed Mobility Overal bed mobility: Modified Independent             General bed mobility comments: Pt required adjustable bed, HOB elevated and taking increased time with use of bed rail to complete log roll technique.  Transfers Overall transfer level: Modified independent Equipment used: None Transfers: Sit to/from Stand Sit to Stand: From elevated surface;Supervision;Modified independent (Device/Increase time)         General transfer comment: pt requires EOB and and chair height elevated.   Ambulation/Gait Ambulation/Gait assistance: Supervision Gait Distance (Feet): 600 Feet Assistive device: None Gait Pattern/deviations: Step-through pattern;Decreased stride length;Wide base  of support Gait velocity: decr   General Gait Details: pt with slow gait and decreased arm swing and reduced knee flexion. pt denied increased pain with gait.   Stairs             Wheelchair Mobility    Modified Rankin (Stroke Patients Only)       Balance Overall balance assessment: Needs assistance Sitting-balance support: Feet supported Sitting balance-Leahy Scale: Good     Standing balance support: No upper extremity supported Standing balance-Leahy Scale: Good Standing balance comment: mild gait deviations observed                            Cognition Arousal/Alertness: Awake/alert Behavior During Therapy: WFL for tasks assessed/performed Overall Cognitive Status: Within Functional Limits for tasks assessed                                        Exercises Other Exercises Other Exercises: 10 Reps Sit<>Stand with education on Rt LE bias by positioning Lt LE more anterior.  Other Exercises: educated on restrograde massafe to Rt knee to reduce edema.    General Comments        Pertinent Vitals/Pain Pain Assessment: Faces Faces Pain Scale: Hurts little more Pain Location: shoulders, elbows, wrists, knees, ankles Pain Descriptors / Indicators: Grimacing;Guarding Pain Intervention(s): Limited activity within patient's tolerance;Monitored during session;Repositioned    Home Living                      Prior Function  PT Goals (current goals can now be found in the care plan section) Acute Rehab PT Goals Patient Stated Goal: decrease pain, get stronger, get back to yoga and dancing PT Goal Formulation: With patient Time For Goal Achievement: 03/31/20 Potential to Achieve Goals: Good Progress towards PT goals: Progressing toward goals    Frequency    Min 3X/week      PT Plan Current plan remains appropriate    Co-evaluation              AM-PAC PT "6 Clicks" Mobility   Outcome Measure   Help needed turning from your back to your side while in a flat bed without using bedrails?: None Help needed moving from lying on your back to sitting on the side of a flat bed without using bedrails?: A Little Help needed moving to and from a bed to a chair (including a wheelchair)?: None Help needed standing up from a chair using your arms (e.g., wheelchair or bedside chair)?: None Help needed to walk in hospital room?: None Help needed climbing 3-5 steps with a railing? : A Little 6 Click Score: 22    End of Session Equipment Utilized During Treatment: Gait belt Activity Tolerance: Patient tolerated treatment well Patient left: in chair;with call bell/phone within reach Nurse Communication: Mobility status PT Visit Diagnosis: Difficulty in walking, not elsewhere classified (R26.2);Pain Pain - Right/Left:  (bil) Pain - part of body: Shoulder;Hand;Knee;Ankle and joints of foot     Time: 9379-0240 PT Time Calculation (min) (ACUTE ONLY): 34 min  Charges:  $Gait Training: 8-22 mins $Therapeutic Exercise: 8-22 mins                     Wynn Maudlin, DPT Acute Rehabilitation Services  Office [phone] Pager [phone]  03/23/2020 12:52 PM

## 2020-03-23 NOTE — Progress Notes (Signed)
Surprisingly, Kaitlin Holloway had the bone marrow biopsy on Monday.  Result came back.  The bone marrow biopsy pathology report is certainly not all the conclusive.  There is an increase in eosinophils.  We still do not know if the eosinophilia is primary (i.e. 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  I think the way that we will know is for time and to treat the rheumatoid arthritis that she has.  The real problem with treating her with steroids is given your diabetes.  Her blood sugar is really not that well controlled.  She will not take insulin at home what she tells me.  I would entertain treating the collagen vascular issue.  I am unsure what is primary treatment for rheumatoid arthritis right now.  I guess one could use steroids.  1 could also use methotrexate.  I am not sure if monoclonal antibody therapy is utilized upfront.  We do not have back her differential today.  Her white cell count is still elevated.  To make a diagnosis of hypereosinophilic syndrome, she needs to have eosinophilia for 6 months.  She does not have any obvious "end organ" issues that associated with hypereosinophilic syndrome.  Typically, joints are not involved with the hypereosinophilic syndrome.  Her echocardiogram shows normal cardiac function and normal valvular structure.  I will have the pathologist send off for specific markers that we run for patients who might have hypereosinophilic syndrome.  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  Also, her blood sugars really need to get under better control.  I just am not convinced that she will control these at home all that well.  I know that she is getting wonderful care from all the staff upon 6 E.  Kaitlin Haw, MD  [place] 12:3

## 2020-03-23 NOTE — Progress Notes (Signed)
Inpatient Diabetes Program Recommendations  AACE/ADA: New Consensus Statement on Inpatient Glycemic Control (2015)  Target Ranges:  Prepandial:   less than 140 mg/dL      Peak postprandial:   less than 180 mg/dL (1-2 hours)      Critically ill patients:  140 - 180 mg/dL   Lab Results  Component Value Date   GLUCAP 239 (H) 03/23/2020   HGBA1C 9.7 (H) 03/16/2020    Review of Glycemic Control  Diabetes history: DM2 Outpatient Diabetes medications: None Current orders for Inpatient glycemic control: Lantus 10 units QHS, Novolog 0-9 units tidwc + 3 units tidwc  FBS still in 200s. Titrate basal insulin  Inpatient Diabetes Program Recommendations:  Increase Lantus to 15 units QHS Increase Novolog to 4 units tidwc Add Novolog HS correction.     If pt is to be d/ced on insulin, will need to teach insulin pen administration. MD please advise.  Continue to follow.  Thank you. Ailene Ards, RD, LDN, CDE Inpatient Diabetes Coordinator [phone]

## 2020-03-23 NOTE — Progress Notes (Signed)
Chaplain engaged in initial visit with Albirtha.  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  Chaplain provided education around document.  During visit, Lerline shared her desires for getting things prepared and set in order because of previous experiences in her family.  Maylene wants to assign her daughter as her healthcare POA.  Emilee also shared the many things she has been enduring surrounding being in the hospital.  From thinking about work, Community education officer, to paying bills, Ruvi has been consumed with those things as she also deeply think about her health and what is happening to her body.  Chaplain prayed with Noorah regarding the pressure she may feel with everything happening around her.  Chaplain noted that it may be good to speak with social work as she thinks about what life outside of the hospital looks like.  Antanisha has suffered some unfortunate events and experiences and would benefit from having some help, concrete answers to what is happening to her body, and being exposed to some resources concerning taking care of bills and her home while in the hospital.   Chaplain will follow-up.      03/23/20 1200  Clinical Encounter Type  Visited With Patient  Visit Type Initial  Referral From Nurse  Consult/Referral To Chaplain  Spiritual Encounters  Spiritual Needs Prayer;Other (Comment) (Advanced Directive)

## 2020-03-23 NOTE — Progress Notes (Addendum)
Occupational Therapy Treatment Patient Details Name: Kaitlin Holloway MRN: [id number] DOB: 15-Dec-1970 Today's Date: 03/23/2020    History of present illness Pt is 49 y.o. female with PMH of COVID 61, DM, HTN, and chronic polyarthralgia presents to the emergency department with continued joint pain but new onset difficulty breathing. Pt stated joint pain started after likely covid infection in March 2020.  Pt with underwent bone biopsy Monday 03/21/20.   OT comments  Treatment focused on education in regards to joint protection, energy conservation and discussing compensatory strategies for home environment and home tasks with joint protection in mind. Patient provided with hand out to maximize retention of education. Patient verbalized understanding and asked questions as needed. Patient performing mobility and ADLs with modified independence in room without assistance. Patient reports pain in joints has improved and still needing increased time and elevated surfaces for all tasks. Patient has met acute care OT goals. Will benefit from OP OT or HH OT at discharge.    Follow Up Recommendations  Outpatient OT    Equipment Recommendations  Tub/shower bench    Recommendations for Other Services      Precautions / Restrictions Precautions Precautions: None       Mobility Bed Mobility Overal bed mobility: Modified Independent                Transfers Overall transfer level: Modified independent                    Balance Overall balance assessment: Modified Independent                                         ADL either performed or assessed with clinical judgement   ADL                                         General ADL Comments: Patient reports modified independence with ADLs and ambulating in room without assistance. Reports increased time for all activities due to joint pain. Reports mod I for toileting, bathing, grooming and  dressing. Joint protection education reiterated and handout provided to maximize retention. Discussion of home environment needs and examples of joint protection and energy conservation strategies provided. Patient verbalized understanding and asked questions as needed.     Vision Patient Visual Report: No change from baseline     Perception     Praxis      Cognition Arousal/Alertness: Awake/alert Behavior During Therapy: WFL for tasks assessed/performed Overall Cognitive Status: Within Functional Limits for tasks assessed                                          Exercises     Shoulder Instructions       General Comments      Pertinent Vitals/ Pain       Pain Assessment: Faces Faces Pain Scale: Hurts little more Pain Location: shoulders, elbows, wrists, knees, ankles, hands Pain Descriptors / Indicators: Grimacing;Guarding Pain Intervention(s): Monitored during session  Home Living  Prior Functioning/Environment              Frequency           Progress Toward Goals  OT Goals(current goals can now be found in the care plan section)  Progress towards OT goals: Goals met/education completed, patient discharged from OT  Acute Rehab OT Goals Patient Stated Goal: decrease pain, get stronger, get back to yoga and dancing  Plan All goals met and education completed, patient discharged from OT services    Co-evaluation                 AM-PAC OT "6 Clicks" Daily Activity     Outcome Measure   Help from another person eating meals?: None Help from another person taking care of personal grooming?: None Help from another person toileting, which includes using toliet, bedpan, or urinal?: None Help from another person bathing (including washing, rinsing, drying)?: None Help from another person to put on and taking off regular upper body clothing?: None Help from another person to  put on and taking off regular lower body clothing?: None 6 Click Score: 24    End of Session        Activity Tolerance Patient tolerated treatment well   Patient Left in bed;with call bell/phone within reach   Nurse Communication  (okay to see per RN)        Time: 1520-1530 OT Time Calculation (min): 10 min  Charges: OT General Charges $OT Visit: 1 Visit OT Treatments $Self Care/Home Management : 8-22 mins  Derl Barrow, OTR/L [place]  Office [phone] Pager: [phone]    Lenward Chancellor 03/23/2020, 5:07 PM

## 2020-03-24 LAB — CULTURE, BLOOD (ROUTINE X 2)
Culture: NO GROWTH
Culture: NO GROWTH
Special Requests: ADEQUATE
Special Requests: ADEQUATE

## 2020-03-24 LAB — CBC WITH DIFFERENTIAL/PLATELET
Abs Immature Granulocytes: 0.04 10*3/uL (ref 0.00–0.07)
Basophils Absolute: 0.1 10*3/uL (ref 0.0–0.1)
Basophils Relative: 0 %
Eosinophils Absolute: 12.1 10*3/uL — ABNORMAL HIGH (ref 0.0–0.5)
Eosinophils Relative: 67 %
HCT: 40.1 % (ref 36.0–46.0)
Hemoglobin: 13.7 g/dL (ref 12.0–15.0)
Immature Granulocytes: 0 %
Lymphocytes Relative: 11 %
Lymphs Abs: 2.1 10*3/uL (ref 0.7–4.0)
MCH: 30.4 pg (ref 26.0–34.0)
MCHC: 34.2 g/dL (ref 30.0–36.0)
MCV: 88.9 fL (ref 80.0–100.0)
Monocytes Absolute: 0.4 10*3/uL (ref 0.1–1.0)
Monocytes Relative: 2 %
Neutro Abs: 3.5 10*3/uL (ref 1.7–7.7)
Neutrophils Relative %: 20 %
Platelets: 231 10*3/uL (ref 150–400)
RBC: 4.51 MIL/uL (ref 3.87–5.11)
RDW: 13.5 % (ref 11.5–15.5)
WBC: 18.2 10*3/uL — ABNORMAL HIGH (ref 4.0–10.5)
nRBC: 0 % (ref 0.0–0.2)

## 2020-03-24 LAB — GLUCOSE, CAPILLARY
Glucose-Capillary: 192 mg/dL — ABNORMAL HIGH (ref 70–99)
Glucose-Capillary: 165 mg/dL — ABNORMAL HIGH (ref 70–99)
Glucose-Capillary: 152 mg/dL — ABNORMAL HIGH (ref 70–99)
Glucose-Capillary: 119 mg/dL — ABNORMAL HIGH (ref 70–99)

## 2020-03-24 LAB — UPEP/UIFE/LIGHT CHAINS/TP, 24-HR UR
% BETA, Urine: 17.5 %
ALPHA 1 URINE: 3.6 %
Albumin, U: 56 %
Alpha 2, Urine: 2.8 %
Free Kappa Lt Chains,Ur: 228.75 mg/L — ABNORMAL HIGH (ref 0.63–113.79)
Free Kappa/Lambda Ratio: 11.52 (ref 1.03–31.76)
Free Lambda Lt Chains,Ur: 19.85 mg/L — ABNORMAL HIGH (ref 0.47–11.77)
GAMMA GLOBULIN URINE: 20.2 %
Total Protein, Urine-Ur/day: 322 mg/24 hr — ABNORMAL HIGH (ref 30–150)
Total Protein, Urine: 20.8 mg/dL
Total Volume: 1550

## 2020-03-24 LAB — COMPREHENSIVE METABOLIC PANEL
ALT: 17 U/L (ref 0–44)
AST: 17 U/L (ref 15–41)
Albumin: 3.1 g/dL — ABNORMAL LOW (ref 3.5–5.0)
Alkaline Phosphatase: 73 U/L (ref 38–126)
Anion gap: 8 (ref 5–15)
BUN: 22 mg/dL — ABNORMAL HIGH (ref 6–20)
CO2: 23 mmol/L (ref 22–32)
Calcium: 8.9 mg/dL (ref 8.9–10.3)
Chloride: 105 mmol/L (ref 98–111)
Creatinine, Ser: 0.76 mg/dL (ref 0.44–1.00)
GFR, Estimated: >60 mL/min (ref >60)
Glucose, Bld: 194 mg/dL — ABNORMAL HIGH (ref 70–99)
Potassium: 4.2 mmol/L (ref 3.5–5.1)
Sodium: 136 mmol/L (ref 135–145)
Total Bilirubin: 0.6 mg/dL (ref 0.3–1.2)
Total Protein: 6.1 g/dL — ABNORMAL LOW (ref 6.5–8.1)

## 2020-03-24 LAB — LACTATE DEHYDROGENASE: LDH: 334 U/L — ABNORMAL HIGH (ref 98–192)

## 2020-03-24 MED ORDER — CIPROFLOXACIN IN D5W 400 MG/200ML IV SOLN: 400.0000 mg | INTRAVENOUS | Status: AC

## 2020-03-24 MED ORDER — HYDROMORPHONE HCL 1 MG/ML IJ SOLN
1.0000 mg | INTRAMUSCULAR | Status: DC | PRN
  Administered 2020-03-24 – 2020-03-31 (×24): 1 mg via INTRAVENOUS
  Filled 2020-03-24 (×25): qty 1

## 2020-03-24 NOTE — Progress Notes (Signed)
PROGRESS NOTE    Kaitlin Holloway  CZY:[id number] DOB: 1970/08/02 DOA: 03/15/2020 PCP: Mack Hook, MD   Chief Complaint  Patient presents with  . Joint Pain    Brief Narrative: 49 year old female with past medical history of T2DM, has not been taking her medication, comes to the ED for evaluation of shortness of breath/ joint pain. As per report Kaitlin Holloway reported she thinks she contracted Covid March 2020,she  Reported she was in perfect health condition doing regular exercise a few times a week prior to that.However after March 2020 she started to have diffuse joint pain muscle aches,weakness, she has been taking ibuprofen 1-3 times per day for more than a year now, she noticed she is getting slower, and weaker she cannot make a fist due to stiffness and weakness, she will report proximal muscle weakness is harder for her to raise arms , harder for her to get from sitting position to stand up position .she presented to the hospital due to significant upper back pain to the point she cannot breath. Patient had managed her diabetes with yoga and dietary changes due to mistrust of the medical system. 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 Seen by neurology, hematology and undergoing rheumatologic work-up:CRP 3.5>2.7, ESR 28>35. LDH 385> 360. rpr neg, uric acid 4.1 nl, cytology no eosinophilia,Anti Jo av Ig < 0.2, CCP IGA/IGA:176, RF > 650, ANA NEG, aldolase +20.4, SPEPE no monoclonal protein.  UPEP/UIFE/Light chain pending.Patient underwent bone  marrow biopsy after being seen by hematology due to persistent leukocytosis and eosinophilia-report is not conclusive as per hematology and not sure if it is primary I;e hypereosinophilic syndrome or reactive due to collagen vascular disease.  Subjective: No acute issues or events overnight, patient continues to have general malaise, fatigue, weakness and myalgias.  She denies overt fevers, chills, nausea, vomiting, diarrhea or constipation.  Assessment & Plan:  Generalized body weight with polyarthralgia, bilateral ankle swelling, stiffness in the hands and proximal muscle weakness, small joint tenderness and tenderness on upper thoracic spine:  Plan for muscle biopsy with general surgery on 03/25/2020 per recommendations from [facility] rheumatology -we will likely transition to high-dose steroids thereafter with 1 mg/kg. - Underwent extensive work-up as above with CT spine, MRI spine, see report for details.  No PE on CT, no DVT on duplex, bilateral ankle x-ray with degenerative changes, lab with mild rhabdomyolysis. 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 MRI lumbar spine-disc and facet degeneration.labs-mildly elevated ESR and CRP, ANA negative, anti-Jo 1 negative CRP  3.5>2.7,ESR mildly elevated  28>35. LDH 385> 360. rpr neg, uric acid 4.1 nl, cytology no eosinophilia,CCP IGA/IGA:176, RF > 650,CK 1219> 773>400, aldolase 20.4. Dr Erlinda Hong- " discussed with  [facility] who initially recommend transfer, however, [facility] is at capacity, no bed, rheumatology recommend consult neurology and hematology here and advised holding steroids until further work up,bilateral hand and foot x ray -showed no evidence of arthropathy.  Seen by neurology and hematology. ? 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 Seen by neurology appreciate input, neurology has ordered [facility] myositis panel for outpatient follow-up, aldolase +, SPEP negative per monoclonal protein/UPEP IN PROCESS, B12 for neuropathy work-up normal and will need EMG NCS will be as an outpatient.hep panel negative.  Discussed with Dr. Marin Olp- ?steroid trial.I called [facility] line [phone] Who paged Dr Jannifer Rodney Rheum on call and I discussed with him (he was also involved with Dr Phoebe Sharps first call) -advised to check MRI of muscle and biopsy the muscle ( for myositis) then can consider trial of steroid 1 mg/kg, requested him to schedule follow up- will need to send order for referral at [phone]. [facility] no [facility] at [facility] and I am not sure if she really needs inpatient transfer and per Dr Jannifer Rodney likely does not need inpatient transfer. IR consulted- contact- informed by IR to contact Dr Lyman Bishop in am at  [phone] or pager [phone]., ?mri femur to eval for myositis .  Deconditioning from #1: PT/OT woring with her.  She is demonstrating transfers modified independent level/bed elevated, has been ambulating in the room independently and in the hallway with supervision but has decreased range of motion with joint pain throughout and also from right knee injury.  Dyspnea on admission, currently improved.  CTA chest no acute finding.  Echocardiogram with a small pericardial effusion otherwise unremarkable.  No shortness of breath or chest pain.    SVT ? nonsustained initially with activity not sure if patient had tachycardia during dyspnea episode.TSH is stable.Continue Lopressor.  Non-insulin dependent diabetes mellitus, blood sugar uncontrolled increase Lantus further, start on glipizide 10 mg, continue Premeal insulin and sliding scale.Patient is reluctant to be on insulin but agreeable to start oral medication.  I am afraid she may need to go  home on insulin injection especially if she requires steroid for rheumatoid Recent Labs  Lab 03/23/20 0743 03/23/20 1229 03/23/20 1659 03/23/20 2153 03/24/20 0745  GLUCAP 239* 204* 102* 170* 192*   Recent history of cataract surgery on right eye.  Leukocytosis persistent with Eosinphil 13.0,she has been afebrile.BM biopsy"Normocellular bone marrow for age with prominent eosinophilic Proliferation".procal was 0.1 normal on 03/16/20, no fever. Recent Labs  Lab 03/18/20 0541 03/21/20 0727 03/22/20 0635 03/23/20 0624 03/24/20 0627  WBC 18.2* 18.7* 19.9* 19.3* 18.2*   Hypertension:not on medication at home.BP controlled on on losartan and low-dose metoprolol. 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   Patient made a comment to Dr Maxie Better we didn't figure it out soon she wanted to just die" Neurology was consulted and currently denies suicidal ideation and has been cleared.  Spoke with [facility] do not have any bed yet and is not accepted for transfer.Original transfer RN was [facility].Spoke w/ Liliane Channel 11/30. Dr Jannifer Rodney on call on 11/25 for Dr Erlinda Hong.  Nutrition: Diet Order            Diet regular Room service appropriate? Yes; Fluid consistency: Thin  Diet effective now                 Body mass index is 28.25 kg/m.  DVT prophylaxis: enoxaparin (LOVENOX) injection 40 mg Start: 03/25/20 1000 Place and maintain sequential compression device Start: 03/17/20 1714 Code Status:   Code Status: Full Code  Family Communication: plan of care discussed with patient at bedside.  Status is: Inpatient Remains inpatient appropriate because:Ongoing diagnostic testing needed not appropriate for outpatient work up and Inpatient level of care appropriate due to severity of illness  Dispo: The patient is from: Home              Anticipated d/c is to: Home.              Anticipated d/c  date is: 3 days              Patient currently is not medically stable to d/c.  Consultants:see note  Procedures:see note  Culture/Microbiology    Component Value Date/Time   SDES  03/19/2020 1647    BLOOD LEFT ANTECUBITAL Performed at [facility], [place] [address] [place], [place] [address]    SPECREQUEST  03/19/2020 1647    BOTTLES DRAWN AEROBIC ONLY Blood Culture adequate volume Performed at [facility] [address], [place], [place] [address]    CULT  03/19/2020 1647    NO GROWTH 4 DAYS Performed at [facility] [address] [place],  [address]    REPTSTATUS PENDING 03/19/2020 1647    Other culture-see note  Medications: Scheduled Meds: . [START ON 03/25/2020] enoxaparin (LOVENOX) injection  40 mg Subcutaneous Q24H  . glipiZIDE  10 mg Oral QAC breakfast  . insulin aspart  0-9 Units Subcutaneous TID WC  . insulin aspart  4 Units Subcutaneous TID WC  . insulin glargine  15 Units Subcutaneous QHS  . losartan  25 mg Oral Daily  . melatonin  3 mg Oral QHS  . metoprolol tartrate  12.5 mg Oral BID  . nystatin   Topical BID  . senna-docusate  1 tablet Oral BID   Continuous Infusions:  Antimicrobials: Anti-infectives (From admission, onward)   Start     Dose/Rate Route Frequency Ordered Stop   03/16/20 0415  doxycycline (VIBRAMYCIN) 100 mg in sodium chloride 0.9 % 250 mL IVPB        100 mg 125 mL/hr over 120 Minutes Intravenous  Once 03/16/20 0407 03/16/20 0637    Objective: Vitals: Today's Vitals   03/23/20 2328 03/24/20 0049 03/24/20 0149 03/24/20 0527  BP:    128/85  Pulse:    78  Resp:    16  Temp:    98.2 F (36.8 C)  TempSrc:    Oral  SpO2:    100%  Weight:      Height:      PainSc: Asleep 9  Asleep     Intake/Output Summary (Last 24 hours) at 03/24/2020 0755 Last data filed at 03/23/2020 1852 Gross per 24 hour  Intake 640 ml  Output --  Net 640 ml   Filed Weights   03/16/20 0539  Weight:  99.8 kg   Weight change:   Intake/Output from previous day: 12/01 0701 - 12/02 0700 In: 640 [P.O.:640] Out: -  Intake/Output this shift: No intake/output data recorded.  Examination: General exam: AAOx3 , NAD, weak appearing. HEENT:Oral mucosa moist, Ear/Nose WNL grossly, dentition normal. Respiratory system: bilaterally clear,no wheezing or crackles,no use of accessory muscle Cardiovascular system: S1 & S2 +, No JVD,. Gastrointestinal system: Abdomen soft, NT,ND, BS+ Nervous System:Alert, awake, moving extremities and grossly nonfocal Extremities: No edema, distal peripheral pulses palpable.Tenderness in small joints of wrist elbow, poor grip, able to lift/raise upper arms-power 4/5.  Non-tender thigh. Skin: No rashes,no icterus. MSK: Normal muscle bulk,tone, power  Data Reviewed: I have personally reviewed following labs and imaging studies  CBC: Recent Labs  Lab 03/18/20 0541 03/21/20 0727 03/22/20 0635 03/23/20 0624 03/24/20 0627  WBC 18.2* 18.7* 19.9* 19.3* 18.2*  NEUTROABS 3.6 3.4  --   --  3.5  HGB 13.9 14.0 13.9 14.1 13.7  HCT 39.8 39.4 39.4 39.7 40.1  MCV 86.9 86.6 87.2 87.4 88.9  PLT 206 213 223 232 130   Basic Metabolic Panel: Recent Labs  Lab 03/18/20 0541 03/21/20 0727 03/22/20 0635 03/23/20 0624 03/24/20 0627  NA 134* 135 134* 135 136  K 3.8 4.1 4.4 4.3 4.2  CL 102 101 101 102 105  CO2 _0 GLUCOSE 189* 225* 220* 262* 194*  BUN _1 25* 22*  CREATININE 0.86 0.77 0.82 0.81 0.76  CALCIUM 8.7* 8.9 8.9 8.9 8.9   GFR: Estimated Creatinine Clearance: 114.9 mL/min (by C-G formula based on SCr of 0.76 mg/dL). Liver Function Tests: Recent Labs  Lab 03/21/20 0727 03/22/20 0635 03/23/20 0624 03/24/20 0627  AST _2 ALT _3 ALKPHOS 75 71 84 73  BILITOT 0.6 0.9 0.7 0.6  PROT 6.5 6.4* 6.5 6.1*  ALBUMIN 3.2* 3.1* 3.2* 3.1*   No results for input(s): LIPASE, AMYLASE in the last 168 hours. No results for input(s):  AMMONIA in the last 168 hours. Coagulation Profile: No results for input(s): INR, PROTIME in the last 168 hours. Cardiac Enzymes: Recent Labs  Lab 03/18/20 0541 03/21/20 0727  CKTOTAL 360* 428*   BNP (last 3 results) No results for input(s): PROBNP in the last 8760 hours. HbA1C: No results for input(s): HGBA1C in the last 72 hours. CBG: Recent Labs  Lab 03/23/20 0743 03/23/20 1229 03/23/20 1659 03/23/20 2153 03/24/20 0745  GLUCAP 239* 204* 102* 170* 192*   Lipid Profile: No results for input(s): CHOL, HDL, LDLCALC, TRIG, CHOLHDL, LDLDIRECT in the last 72 hours. Thyroid Function Tests: No results for input(s): TSH, T4TOTAL, FREET4, T3FREE, THYROIDAB in the last 72 hours. Anemia Panel: No results for input(s): VITAMINB12, FOLATE, FERRITIN, TIBC, IRON, RETICCTPCT in the last 72 hours. Sepsis Labs: No results for input(s): PROCALCITON, LATICACIDVEN in the last 168 hours.  Recent Results (from the past 240 hour(s))  Resp Panel by RT-PCR (Flu A&B, Covid) Nasopharyngeal Swab     Status: None   Collection Time: 03/16/20 12:04 AM   Specimen: Nasopharyngeal Swab; Nasopharyngeal(NP) swabs in vial transport medium  Result Value Ref Range Status   SARS Coronavirus 2 by RT PCR NEGATIVE NEGATIVE Final    Comment: (NOTE) SARS-CoV-2 target nucleic acids are NOT DETECTED.  The SARS-CoV-2 RNA is generally detectable in upper respiratory specimens during the acute phase of infection. The lowest concentration of SARS-CoV-2 viral copies this assay can detect is 138 copies/mL. A negative result does not preclude SARS-Cov-2 infection and should not be used as the sole basis for treatment or other patient management decisions. A negative result may occur with  improper specimen collection/handling, submission of specimen other than nasopharyngeal swab, presence of viral mutation(s) within the areas targeted by this assay, and inadequate number of viral copies(<138 copies/mL). A negative  result must be combined with clinical observations, patient history, and epidemiological information. The expected result is Negative.  Fact Sheet for Patients:  EntrepreneurPulse.com.au  Fact Sheet for Healthcare Providers:  IncredibleEmployment.be  This test is no t yet approved or cleared by the [place] FDA and  has been authorized for detection and/or diagnosis of SARS-CoV-2 by FDA under an Emergency Use Authorization (EUA).  This EUA will remain  in effect (meaning this test can be used) for the duration of the COVID-19 declaration under Section 564(b)(1) of the Act, 21 U.S.C.section 360bbb-3(b)(1), unless the authorization is terminated  or revoked sooner.       Influenza A by PCR NEGATIVE NEGATIVE Final   Influenza B by PCR NEGATIVE NEGATIVE Final    Comment: (NOTE) The Xpert Xpress SARS-CoV-2/FLU/RSV plus assay is intended as an aid in the diagnosis of influenza from Nasopharyngeal swab specimens and should not be used as a sole basis for treatment. Nasal washings and aspirates are unacceptable for Xpert Xpress SARS-CoV-2/FLU/RSV testing.  Fact Sheet for Patients: EntrepreneurPulse.com.au  Fact Sheet for Healthcare Providers: IncredibleEmployment.be  This test is not yet approved or cleared by the [place] FDA and has been authorized for detection and/or diagnosis of SARS-CoV-2 by FDA under an Emergency Use Authorization (EUA). This EUA will remain in effect (meaning this test can be used) for the duration of the COVID-19 declaration under Section 564(b)(1) of the Act, 21 U.S.C. section 360bbb-3(b)(1), unless the authorization is terminated or revoked.  Performed at [facility], [place] [address], [place], [place] [address]   Culture, blood (routine x 2)     Status: None   Collection Time: 03/16/20  7:55 AM   Specimen: BLOOD  Result Value Ref Range Status    Specimen Description   Final    BLOOD LEFT ANTECUBITAL Performed at [facility] [address] [place], [place] [address]    Special Requests   Final    BOTTLES DRAWN AEROBIC ONLY Blood Culture adequate volume Performed at [facility] [address], [place], [place] [address]    Culture   Final    NO GROWTH 5 DAYS Performed at [facility], [place] [address] [place], [place] [address]    Report Status 03/21/2020 FINAL  Final  Culture, blood (routine x 2)     Status: None   Collection Time: 03/16/20  8:10 AM   Specimen: BLOOD  Result Value Ref Range Status   Specimen Description   Final    BLOOD LEFT ANTECUBITAL Performed at [facility] [address] [place], [place] [address]    Special Requests   Final    BOTTLES DRAWN AEROBIC ONLY Blood Culture results may not be optimal due to an inadequate volume of blood received in culture bottles Performed at [facility] [address] [place], [place] [address]    Culture   Final    NO GROWTH 5 DAYS Performed at [facility], [place] [address] [place], [place] [address]    Report Status 03/21/2020 FINAL  Final  Culture, blood (routine x 2)     Status: None (Preliminary result)   Collection Time: 03/19/20  4:39 PM   Specimen: BLOOD  Result Value Ref Range Status   Specimen Description   Final    BLOOD LEFT ANTECUBITAL Performed at [facility] [address], [place], [place] [address]    Special Requests   Final    BOTTLES DRAWN AEROBIC ONLY Blood Culture adequate volume Performed at [facility] [address] [place], [place] [address]    Culture   Final    NO GROWTH 4 DAYS Performed at [facility], [place] [address], [place], [place] [address]    Report Status PENDING  Incomplete  Culture, blood (routine x 2)  Status: None (Preliminary result)   Collection Time: 03/19/20  4:47 PM    Specimen: BLOOD  Result Value Ref Range Status   Specimen Description   Final    BLOOD LEFT ANTECUBITAL Performed at [facility] [address], [place], [place] [address]    Special Requests   Final    BOTTLES DRAWN AEROBIC ONLY Blood Culture adequate volume Performed at [facility] [address] [place], [place] [address]    Culture   Final    NO GROWTH 4 DAYS Performed at [facility], [place] [address], [place], [place] [address]    Report Status PENDING  Incomplete     Radiology Studies: No results found.   LOS: 7 days   Little Ishikawa, DO [facility]  03/24/2020, 7:55 AM

## 2020-03-24 NOTE — Consult Note (Signed)
[facility] Surgery Consult Note  Kaitlin Holloway 05-17-1970  [id number].    Requesting MD: Carma Leaven Chief Complaint: Severe joint pain from her neck down to her feet/difficulty breathing Reason for Consult: Muscle biopsy  HPI patient is a 49 year old female with a history of type 2 diabetes poorly controlled presented to the ED with shortness of breath she has a history of upper respiratory tract infection which the patient thought to be Covid, but was never confirmed with testing.  She reports having generalized body aches and swelling in extremities.  She has trouble lifting her arms and walking now. All of her symptoms have become progressively worse. She has been treating this with ibuprofen at home. When she came to the ED she was complaining of difficulty breathing.  Work-up shows he has been afebrile, vital signs of been stable.  In addition to her diffuse joint pain, muscle aches weakness she knows she is getting slower weaker and having increased stiffness with proximal muscle weakness harder for her to get from a sitting position to stand up. Raising her arms is also difficult. 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  She was seen by neurology on 03/20/2020. 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 She is also been seen by hematology/oncology -Dr. Myna Hidalgo.  They are attempting to get her transferred to [facility] but currently no beds are available.  They are requesting a muscle biopsy and we are asked to see.  ROS: Review of Systems  Constitutional: Negative.   HENT: Negative.   Eyes: Negative.   Respiratory: Positive for shortness of breath.   Gastrointestinal: Negative.   Genitourinary: Negative.   Musculoskeletal: Positive for joint pain and myalgias.  Skin: Negative.   Neurological: Negative.    Endo/Heme/Allergies: Negative.   Psychiatric/Behavioral: Positive for depression.    Family History  Problem Relation Age of Onset  . Heart disease Mother 7       AMI  . Diabetes Mother   . Hypertension Mother   . Hyperlipidemia Mother     Past Medical History:  Diagnosis Date  . Arthritis    knee  . COVID-19 06/2018   Residual joint pain  . Diabetes mellitus without complication (HCC)    Type 2, does not take her meds.  . Hypertension 2002  . Joint pain    since having COVID 06/2018  . Motion sickness    boats, car - back seat  . Prediabetes 2016    Past Surgical History:  Procedure Laterality Date  . ABDOMINAL HYSTERECTOMY  2016   unilateral oophorectomy.  Does not know what side.  For fibroids and heavy bleeding.  . APPENDECTOMY     age 30  . CATARACT EXTRACTION W/PHACO Right 01/28/2020   Procedure: CATARACT EXTRACTION PHACO AND INTRAOCULAR LENS PLACEMENT (IOC) RIGHT VISION BLUE 13.73  01:10.8;  Surgeon: Elliot Cousin, MD;  Location[facility];  Service: Ophthalmology;  Laterality: Right;  Diabetes Latex  . TONSILLECTOMY      Social History:  reports that she quit smoking about 19 years ago. Her smoking use included cigarettes. She has a 25.50 pack-year smoking history. She has never used smokeless tobacco. She reports that she does not drink alcohol and does not use drugs.  Allergies:  Allergies  Allergen Reactions  . Penicillins Other (See Comments)    Has patient had a  PCN reaction causing immediate rash, facial/tongue/throat swelling, SOB or lightheadedness with hypotension: Yes Has patient had a PCN reaction causing severe rash involving mucus membranes or skin necrosis: No Has patient had a PCN reaction that required hospitalization: No Has patient had a PCN reaction occurring within the last 10 years: No If all of the above answers are "NO", then may proceed with Cephalosporin use.   . Aspirin Other (See Comments)    Muscle spasms in back  .  Garlic Swelling  . Morphine And Related Other (See Comments)    Headaches/ migraine  . Gadavist [Gadobutrol] Nausea And Vomiting    Nausea and vomiting immediately after contrast injection   . Latex Rash    Gloves - when worn    Medications Prior to Admission  Medication Sig Dispense Refill  . ibuprofen (ADVIL) 400 MG tablet Take 400 mg by mouth every 6 (six) hours as needed for moderate pain.     Marland Kitchen glipiZIDE (GLUCOTROL) 5 MG tablet 1 tab by mouth twice daily with meals (Patient not taking: Reported on 01/25/2020) 60 tablet 11  . lisinopril (ZESTRIL) 10 MG tablet Take 1 tablet (10 mg total) by mouth daily. (Patient not taking: Reported on 03/16/2020) 30 tablet 1  . metFORMIN (GLUCOPHAGE-XR) 500 MG 24 hr tablet 1 tab by mouth twice daily with meals (Patient not taking: Reported on 01/25/2020) 60 tablet 11    Blood pressure 132/75, pulse 87, temperature 98.2 F (36.8 C), temperature source Oral, resp. rate 16, height 6\' 2"  (1.88 m), weight 99.8 kg, SpO2 100 %. Physical Exam:  General: pleasant, WD, WN AA female who is laying in bed in NAD HEENT: head is normocephalic, atraumatic.  Sclera are noninjected. Pupils are equal  ears and nose without any masses or lesions.  Mouth is pink and moist Heart: regular, rate, and rhythm.  Normal s1,s2. No obvious murmurs, gallops, or rubs noted.  Palpable radial and pedal pulses bilaterally Lungs: CTAB, no wheezes, rhonchi, or rales noted.  Respiratory effort nonlabored Abd: soft, NT, ND, +BS, no masses, hernias, or organomegaly MS: all 4 extremities are symmetrical with no cyanosis, clubbing, or edema. She has trouble lifting with both arms and legs. She says the right side is more severe than the left. Skin: warm and dry with no masses, lesions, or rashes Neuro: Cranial nerves 2-12 grossly intact, sensation is normal throughout Psych: A&Ox3 with an appropriate affect.   Results for orders placed or performed during the hospital encounter of 03/15/20  (from the past 48 hour(s))  Glucose, capillary     Status: Abnormal   Collection Time: 03/22/20  5:37 PM  Result Value Ref Range   Glucose-Capillary 259 (H) 70 - 99 mg/dL    Comment: Glucose reference range applies only to samples taken after fasting for at least 8 hours.   Comment 1 Notify RN    Comment 2 Document in Chart   Glucose, capillary     Status: Abnormal   Collection Time: 03/22/20  9:22 PM  Result Value Ref Range   Glucose-Capillary 232 (H) 70 - 99 mg/dL    Comment: Glucose reference range applies only to samples taken after fasting for at least 8 hours.  Lactate dehydrogenase     Status: Abnormal   Collection Time: 03/23/20  6:24 AM  Result Value Ref Range   LDH 326 (H) 98 - 192 U/L    Comment: Performed at [facility], [address] [place], [place] [address]  Comprehensive metabolic panel  Status: Abnormal   Collection Time: 03/23/20  6:24 AM  Result Value Ref Range   Sodium 135 135 - 145 mmol/L   Potassium 4.3 3.5 - 5.1 mmol/L   Chloride 102 98 - 111 mmol/L   CO2 25 22 - 32 mmol/L   Glucose, Bld 262 (H) 70 - 99 mg/dL    Comment: Glucose reference range applies only to samples taken after fasting for at least 8 hours.   BUN 25 (H) 6 - 20 mg/dL   Creatinine, Ser 4.69 0.44 - 1.00 mg/dL   Calcium 8.9 8.9 - 62.9 mg/dL   Total Protein 6.5 6.5 - 8.1 g/dL   Albumin 3.2 (L) 3.5 - 5.0 g/dL   AST 17 15 - 41 U/L   ALT 18 0 - 44 U/L   Alkaline Phosphatase 84 38 - 126 U/L   Total Bilirubin 0.7 0.3 - 1.2 mg/dL   GFR, Estimated >52 >84 mL/min    Comment: (NOTE) Calculated using the CKD-EPI Creatinine Equation (2021)    Anion gap 8 5 - 15    Comment: Performed at [facility], [address], [place], [place] [address]  CBC     Status: Abnormal   Collection Time: 03/23/20  6:24 AM  Result Value Ref Range   WBC 19.3 (H) 4.0 - 10.5 K/uL   RBC 4.54 3.87 - 5.11 MIL/uL   Hemoglobin 14.1 12.0 - 15.0 g/dL   HCT 01.0 36 - 46 %   MCV  87.4 80.0 - 100.0 fL   MCH 31.1 26.0 - 34.0 pg   MCHC 35.5 30.0 - 36.0 g/dL   RDW 27.2 53.6 - 64.4 %   Platelets 232 150 - 400 K/uL   nRBC 0.0 0.0 - 0.2 %    Comment: Performed at [facility], [address], [place], [place] [address]  Glucose, capillary     Status: Abnormal   Collection Time: 03/23/20  7:43 AM  Result Value Ref Range   Glucose-Capillary 239 (H) 70 - 99 mg/dL    Comment: Glucose reference range applies only to samples taken after fasting for at least 8 hours.   Comment 1 Notify RN    Comment 2 Document in Chart   Glucose, capillary     Status: Abnormal   Collection Time: 03/23/20 12:29 PM  Result Value Ref Range   Glucose-Capillary 204 (H) 70 - 99 mg/dL    Comment: Glucose reference range applies only to samples taken after fasting for at least 8 hours.   Comment 1 Notify RN    Comment 2 Document in Chart   Glucose, capillary     Status: Abnormal   Collection Time: 03/23/20  4:59 PM  Result Value Ref Range   Glucose-Capillary 102 (H) 70 - 99 mg/dL    Comment: Glucose reference range applies only to samples taken after fasting for at least 8 hours.  Glucose, capillary     Status: Abnormal   Collection Time: 03/23/20  9:53 PM  Result Value Ref Range   Glucose-Capillary 170 (H) 70 - 99 mg/dL    Comment: Glucose reference range applies only to samples taken after fasting for at least 8 hours.  Lactate dehydrogenase     Status: Abnormal   Collection Time: 03/24/20  6:27 AM  Result Value Ref Range   LDH 334 (H) 98 - 192 U/L    Comment: Performed at [facility], [address], [place], [place] [address]  Comprehensive metabolic panel  Status: Abnormal   Collection Time: 03/24/20  6:27 AM  Result Value Ref Range   Sodium 136 135 - 145 mmol/L   Potassium 4.2 3.5 - 5.1 mmol/L   Chloride 105 98 - 111 mmol/L   CO2 23 22 - 32 mmol/L   Glucose, Bld 194 (H) 70 - 99 mg/dL    Comment: Glucose reference range applies only to  samples taken after fasting for at least 8 hours.   BUN 22 (H) 6 - 20 mg/dL   Creatinine, Ser 1.61 0.44 - 1.00 mg/dL   Calcium 8.9 8.9 - 09.6 mg/dL   Total Protein 6.1 (L) 6.5 - 8.1 g/dL   Albumin 3.1 (L) 3.5 - 5.0 g/dL   AST 17 15 - 41 U/L   ALT 17 0 - 44 U/L   Alkaline Phosphatase 73 38 - 126 U/L   Total Bilirubin 0.6 0.3 - 1.2 mg/dL   GFR, Estimated >04 >54 mL/min    Comment: (NOTE) Calculated using the CKD-EPI Creatinine Equation (2021)    Anion gap 8 5 - 15    Comment: Performed at [facility], [address], [place], [place] [address]  CBC with Differential/Platelet     Status: Abnormal   Collection Time: 03/24/20  6:27 AM  Result Value Ref Range   WBC 18.2 (H) 4.0 - 10.5 K/uL   RBC 4.51 3.87 - 5.11 MIL/uL   Hemoglobin 13.7 12.0 - 15.0 g/dL   HCT 91.4 36 - 46 %   MCV 88.9 80.0 - 100.0 fL   MCH 30.4 26.0 - 34.0 pg   MCHC 34.2 30.0 - 36.0 g/dL   RDW 78.2 95.6 - 21.3 %   Platelets 231 150 - 400 K/uL   nRBC 0.0 0.0 - 0.2 %   Neutrophils Relative % 20 %   Neutro Abs 3.5 1.7 - 7.7 K/uL   Lymphocytes Relative 11 %   Lymphs Abs 2.1 0.7 - 4.0 K/uL   Monocytes Relative 2 %   Monocytes Absolute 0.4 0.1 - 1.0 K/uL   Eosinophils Relative 67 %   Eosinophils Absolute 12.1 (H) 0.0 - 0.5 K/uL   Basophils Relative 0 %   Basophils Absolute 0.1 0.0 - 0.1 K/uL   Immature Granulocytes 0 %   Abs Immature Granulocytes 0.04 0.00 - 0.07 K/uL    Comment: Performed at [facility], [address] [place], [place] [address]  Glucose, capillary     Status: Abnormal   Collection Time: 03/24/20  7:45 AM  Result Value Ref Range   Glucose-Capillary 192 (H) 70 - 99 mg/dL    Comment: Glucose reference range applies only to samples taken after fasting for at least 8 hours.  Glucose, capillary     Status: Abnormal   Collection Time: 03/24/20 11:34 AM  Result Value Ref Range   Glucose-Capillary 165 (H) 70 - 99 mg/dL    Comment: Glucose reference range  applies only to samples taken after fasting for at least 8 hours.   No results found.    Assessment/Plan Dyspnea Polyarthralgias Hyperglycemia Elevated CK Type 2 diabetes hemoglobin A1c 9.7 Peripheral neuropathy Hypertension  Myositis with request for muscle biopsy   Plan: Patient has been seen and evaluated by Dr. Daphine Deutscher. We will plan to do a left thigh muscle biopsy tomorrow under general anesthesia. Dr. Ermalene Searing discussed procedure with the patient and she is agreeable to proceeding.  Sherrie George [facility] 03/24/2020, 2:05 PM Please see Amion for pager number during  day hours 7:00am-4:30pm

## 2020-03-24 NOTE — Progress Notes (Signed)
Unfortunately, she is still having quite a bit of arthralgias and myalgias.  She had a rough night.  She has this rash on her thighs.  I wonder if this should not be biopsied.  Maybe it might show some kind of vasculitis or possibly some eosinophilic infiltrate.  I just wonder if she needs to have any kind of muscle biopsy.  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  Her labs not yet back.  I still have to believe this is some kind of collagen vascular issue.  From my perspective, she can be put on steroids.  I am unsure what else might be able to be used for the likelihood of rheumatoid arthritis.  Again we will  send the bone marrow off for molecular studies to see if we cannot identify a primary bone marrow disorder (i.e. hypereosinophilic syndrome).  She has had no fever.  There is no diarrhea.  Her appetite is doing okay.  She has had no visual changes.  To me, I will still think biopsies would help.  I would have to think that tissue would be able to help identify what is going on.  She has this rash on her thighs.  She has the myositis on scans.  I know this is quite complicated.  I appreciate all the great help that we are getting from the staff up on 6 E.   Christin Bach, MD  Psalm 41:1

## 2020-03-25 LAB — COMPREHENSIVE METABOLIC PANEL
ALT: 16 U/L (ref 0–44)
AST: 12 U/L — ABNORMAL LOW (ref 15–41)
Albumin: 2.9 g/dL — ABNORMAL LOW (ref 3.5–5.0)
Alkaline Phosphatase: 65 U/L (ref 38–126)
Anion gap: 7 (ref 5–15)
BUN: 15 mg/dL (ref 6–20)
CO2: 24 mmol/L (ref 22–32)
Calcium: 8.8 mg/dL — ABNORMAL LOW (ref 8.9–10.3)
Chloride: 103 mmol/L (ref 98–111)
Creatinine, Ser: 0.69 mg/dL (ref 0.44–1.00)
GFR, Estimated: >60 mL/min (ref >60)
Glucose, Bld: 147 mg/dL — ABNORMAL HIGH (ref 70–99)
Potassium: 4.3 mmol/L (ref 3.5–5.1)
Sodium: 134 mmol/L — ABNORMAL LOW (ref 135–145)
Total Bilirubin: 0.7 mg/dL (ref 0.3–1.2)
Total Protein: 6 g/dL — ABNORMAL LOW (ref 6.5–8.1)

## 2020-03-25 LAB — GLUCOSE, CAPILLARY
Glucose-Capillary: 147 mg/dL — ABNORMAL HIGH (ref 70–99)
Glucose-Capillary: 146 mg/dL — ABNORMAL HIGH (ref 70–99)
Glucose-Capillary: 143 mg/dL — ABNORMAL HIGH (ref 70–99)
Glucose-Capillary: 104 mg/dL — ABNORMAL HIGH (ref 70–99)

## 2020-03-25 LAB — CBC WITH DIFFERENTIAL/PLATELET
Abs Immature Granulocytes: 0.02 10*3/uL (ref 0.00–0.07)
Basophils Absolute: 0.1 10*3/uL (ref 0.0–0.1)
Basophils Relative: 0 %
Eosinophils Absolute: 13 10*3/uL — ABNORMAL HIGH (ref 0.0–0.5)
Eosinophils Relative: 69 %
HCT: 39.5 % (ref 36.0–46.0)
Hemoglobin: 13.6 g/dL (ref 12.0–15.0)
Immature Granulocytes: 0 %
Lymphocytes Relative: 11 %
Lymphs Abs: 2.1 10*3/uL (ref 0.7–4.0)
MCH: 30 pg (ref 26.0–34.0)
MCHC: 34.4 g/dL (ref 30.0–36.0)
MCV: 87 fL (ref 80.0–100.0)
Monocytes Absolute: 0.4 10*3/uL (ref 0.1–1.0)
Monocytes Relative: 2 %
Neutro Abs: 3.5 10*3/uL (ref 1.7–7.7)
Neutrophils Relative %: 18 %
Platelets: 238 10*3/uL (ref 150–400)
RBC: 4.54 MIL/uL (ref 3.87–5.11)
RDW: 13.4 % (ref 11.5–15.5)
WBC: 19.2 10*3/uL — ABNORMAL HIGH (ref 4.0–10.5)
nRBC: 0 % (ref 0.0–0.2)

## 2020-03-25 LAB — LACTATE DEHYDROGENASE: LDH: 325 U/L — ABNORMAL HIGH (ref 98–192)

## 2020-03-25 MED ORDER — SODIUM CHLORIDE 0.9% FLUSH
3.0000 mL | Freq: Two times a day (BID) | INTRAVENOUS | Status: DC
  Administered 2020-03-25 – 2020-03-31 (×11): 3 mL via INTRAVENOUS

## 2020-03-25 MED ORDER — SODIUM CHLORIDE 0.9 % IV SOLN: INTRAVENOUS | Status: DC

## 2020-03-25 MED ORDER — SODIUM CHLORIDE 0.9% FLUSH: 3.0000 mL | INTRAVENOUS | Status: DC | PRN

## 2020-03-25 MED ORDER — SODIUM CHLORIDE 0.9 % IV SOLN: 250.0000 mL | INTRAVENOUS | Status: DC | PRN

## 2020-03-25 NOTE — Progress Notes (Signed)
Physical Therapy Treatment Patient Details Name: Kaitlin Holloway MRN: [id number] DOB: [birth year] Today's Date: 03/25/2020    History of Present Illness Pt is 49 y.o. female with PMH of COVID 19, DM, HTN, and chronic polyarthralgia presents to the emergency department with continued joint pain but new onset difficulty breathing. Pt stated joint pain started after likely covid infection in March 2020.  Pt with underwent bone biopsy Monday 03/21/20.    PT Comments    Patient making good progress with acute PT. She was agreeable to review HEP and initiate light resistance with green theraband. Pt denied pain with exercises but reports pain is typically delayed. She is mobilizing in bed at [facility] Independent level and at EOS pt's lunch arrived and pt sitting up EOB to eat. She was encouraged to continue ambulating in hallway to progress activity tolerance. Acute PT will follow up for review of HEP; recommend OPPT follow up.    Follow Up Recommendations  Outpatient PT     Equipment Recommendations  None recommended by PT    Recommendations for Other Services       Precautions / Restrictions Precautions Precautions: None Precaution Comments: pt denies falls in past 1 year Restrictions Weight Bearing Restrictions: No    Mobility  Bed Mobility Overal bed mobility: Modified Independent             General bed mobility comments: Pt required adjustable bed, HOB elevated and taking increased time with use of bed rail to complete log roll technique.  Transfers                    Ambulation/Gait                 Stairs             Wheelchair Mobility    Modified Rankin (Stroke Patients Only)       Balance Overall balance assessment: Modified Independent Sitting-balance support: Feet supported Sitting balance-Leahy Scale: Good                                      Cognition Arousal/Alertness: Awake/alert Behavior During Therapy: WFL for  tasks assessed/performed Overall Cognitive Status: Within Functional Limits for tasks assessed                                        Exercises General Exercises - Lower Extremity Gluteal Sets: AROM;Both;20 reps;Supine (2x10) Hip ABduction/ADduction: AROM;Both;20 reps;Sidelying (clamshel with green band, 2x10)    General Comments        Pertinent Vitals/Pain Pain Assessment: 0-10 Pain Score: 8  Pain Location: neck is worst right now Pain Descriptors / Indicators: Aching Pain Intervention(s): Limited activity within patient's tolerance;Monitored during session;Premedicated before session;Repositioned    Home Living                      Prior Function            PT Goals (current goals can now be found in the care plan section) Acute Rehab PT Goals Patient Stated Goal: decrease pain, get stronger, get back to yoga and dancing PT Goal Formulation: With patient Time For Goal Achievement: 03/31/20 Potential to Achieve Goals: Good Progress towards PT goals: Progressing toward goals    Frequency    Min 3X/week  PT Plan Current plan remains appropriate    Co-evaluation              AM-PAC PT "6 Clicks" Mobility   Outcome Measure  Help needed turning from your back to your side while in a flat bed without using bedrails?: None Help needed moving from lying on your back to sitting on the side of a flat bed without using bedrails?: A Little Help needed moving to and from a bed to a chair (including a wheelchair)?: None Help needed standing up from a chair using your arms (e.g., wheelchair or bedside chair)?: None Help needed to walk in hospital room?: None Help needed climbing 3-5 steps with a railing? : A Little 6 Click Score: 22    End of Session   Activity Tolerance: Patient tolerated treatment well Patient left: in bed;with call bell/phone within reach;with nursing/sitter in room Nurse Communication: Mobility status PT Visit  Diagnosis: Difficulty in walking, not elsewhere classified (R26.2);Pain     Time: 3500-9381 PT Time Calculation (min) (ACUTE ONLY): 21 min  Charges:  $Therapeutic Exercise: 8-22 mins                     Wynn Maudlin, DPT Acute Rehabilitation Services  Office [phone] Pager [phone]  03/25/2020 12:32 PM

## 2020-03-25 NOTE — TOC Progression Note (Signed)
Transition of Care [facility]) - Progression Note    Patient Details  Name: Kaitlin Holloway MRN: [id number] Date of Birth: 1970-06-26  [facility] CM/SW Contact  Stacy Deshler, Meriam Sprague, RN Phone Number: 03/25/2020, 1:38 PM  Clinical Narrative:    Per MD pt needs quicker follow up with PCP than was scheduled previously and listed on AVS. Multiple [facility] clinics contacted for an earlier appointment. Was able to get appointment with the [facility] 12/[facility]. Appointment placed on AVS. Pt may need help with medications at dc. TOC will continue to follow.   Expected Discharge Plan: Home/Self Care Barriers to Discharge: No Barriers Identified  Expected Discharge Plan and Services Expected Discharge Plan: Home/Self Care    Social Determinants of Health (SDOH) Interventions    Readmission Risk Interventions No flowsheet data found.

## 2020-03-25 NOTE — Progress Notes (Signed)
Procedure unable to be done today secondary to OR timing and having more urgent/emergent cases that need to be done today. I have gone by and informed patient that case will be delayed until Monday AM and she is understanding of this. I have notified the primary attending, patient's RN, pathology and the OR as well. Will put patient back on regular diet and saline lock IV. Will make patient NPO after midnight Sunday for OR Monday.  Juliet Rude, [facility] Surgery 03/25/2020, 11:27 AM Please see Amion for pager number during day hours 7:00am-4:30pm

## 2020-03-25 NOTE — Progress Notes (Signed)
PROGRESS NOTE    Kaitlin Holloway  NUU:[id number] DOB: [birth year] DOA: 03/15/2020 PCP: Mack Hook, MD   Chief Complaint  Patient presents with  . Joint Pain    Brief Narrative: 49 year old female with past medical history of T2DM, has not been taking her medication, comes to the ED for evaluation of shortness of breath/ joint pain. As per report Kaitlin Holloway reported she thinks she contracted Covid March 2020,she  Reported she was in perfect health condition doing regular exercise a few times a week prior to that.However after March 2020 she started to have diffuse joint pain muscle aches,weakness, she has been taking ibuprofen 1-3 times per day for more than a year now, she noticed she is getting slower, and weaker she cannot make a fist due to stiffness and weakness, she will report proximal muscle weakness is harder for her to raise arms , harder for her to get from sitting position to stand up position .she presented to the hospital due to significant upper back pain to the point she cannot breath. Patient had managed her diabetes with yoga and dietary changes due to mistrust of the medical system. 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 Seen by neurology, hematology and undergoing rheumatologic work-up:CRP 3.5>2.7, ESR 28>35. LDH 385> 360. rpr neg, uric acid 4.1 nl, cytology no eosinophilia,Anti Jo av Ig < 0.2, CCP IGA/IGA:176, RF > 650, ANA NEG, aldolase +20.4, SPEPE no monoclonal protein.  UPEP/UIFE/Light chain pending.Patient underwent bone  marrow biopsy after being seen by hematology due to persistent leukocytosis and eosinophilia-report is not conclusive as per hematology and not sure if it is primary I;e hypereosinophilic syndrome or reactive due to collagen vascular disease.  Subjective: Found in the hallway walking.Reports some difficulty with getting up.  Had with  Tender and swollen 2nd third knuckles bilaterally  Assessment & Plan:  Generalized body weight with polyarthralgia, bilateral ankle swelling, stiffness in the hands and proximal muscle weakness, small joint tenderness and tenderness on upper thoracic spine: Patient had extensive work-up  with CT spine, MRI spine, see report for details.  No PE on CT, no DVT on duplex, bilateral ankle x-ray with degenerative changes, lab with mild rhabdomyolysis. 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 MRI lumbar spine-disc and facet degeneration.labs-mildly elevated ESR and CRP, ANA negative, anti-Jo 1 negative CRP  3.5>2.7,ESR mildly elevated  28>35. LDH 385> 360. rpr neg, uric acid 4.1 nl, cytology no eosinophilia,CCP IGA/IGA:176, RF > 650,CK 1219> 773>400, aldolase 20.4.  Rheumatology at [facility] were contacted, no bed for transfer, seen by hematology and neurology here underwent bone marrow biopsy 11/29. normocellular for age with prominent eosinophilic proliferation.Not conclusive as per hematology and not sure if it is primary i;e hypereosinophilic syndrome or reactive due to collagen vascular disease.  Hematology in favor of empiric steroid. On discussion with rheumatology at [facility] muscle biopsy th steroid 1 mg/kg in here and po at home. gen surgery  consulted 12/2 for muscle biopsy and unfortunately not being able to be done today likely on Monday.  Have sent ambulatory referral to rheumatology clinic Dr. Alfonso Ramus.  No indication for inpatient  transfer, but no bed as well.  Deconditioning from #1: Patient has been able to  ambulate.  Stable.   Dyspnea on admission, currently improved.  CTA chest no acute finding.  Echocardiogram with a small pericardial effusion otherwise unremarkable.  Saturating well on room air.   SVT ? nonsustained initially with activity not sure if patient had tachycardia during dyspnea episode.TSH is stable.Continue Lopressor.  Overall stable.  Non-insulin dependent diabetes mellitus, not on medication at home.  Started on glipizide 10 mg, also premeal insulin and Lantus.  Blood sugar overall is stable.  She is reluctant to be going home on insulin and has agreed to take at least oral medication.  She has not been taking medication given some mistrust in her medical system Recent Labs  Lab 03/24/20 1134 03/24/20 1641 03/24/20 2134 03/25/20 0758 03/25/20 1159  GLUCAP 165* 152* 119* 147* 146*   Recent history of cataract surgery on right eye.  No issues.  Leukocytosis persistent with Eosinphil 13.0, afebrile, underwent BM biopsy"Normocellular bone marrow for age with prominent eosinophilic Proliferation".procal was 0.1 normal on 03/16/20, no fever. Recent Labs  Lab 03/21/20 0727 03/22/20 0635 03/23/20 0624 03/24/20 0627 03/25/20 0612  WBC 18.7* 19.9* 19.3* 18.2* 19.2*   Hypertension: Not taking meds at home.  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   Patient made a comment to Dr Maxie Better we didn't figure it out soon she wanted to just die" Neurology was consulted and currently denies suicidal ideation and has been cleared.  Nutrition: Diet Order            Diet NPO time specified Except for: Ice Chips, Sips with Meds  Diet effective midnight           Diet regular Room service appropriate? Yes; Fluid consistency: Thin  Diet effective now                 Body mass index is 28.25 kg/m.  DVT prophylaxis: enoxaparin (LOVENOX) injection 40 mg  Start: 03/25/20 1000 Place and maintain sequential compression device Start: 03/17/20 1714 Code Status:  Code Status: Full Code  Family Communication:Plan of care discussed with patient at bedside.  Status is: Inpatient Remains inpatient appropriate because:Ongoing diagnostic testing needed not appropriate for outpatient work up and Inpatient level of care appropriate due to severity of illness  Dispo: The patient is from: Home              Anticipated d/c is to: Home.              Anticipated d/c date is: 3 days              Patient currently is not medically stable to d/c.  Consultants:see note  Procedures:see note  Culture/Microbiology    Component Value Date/Time   SDES  03/19/2020 1647    BLOOD LEFT ANTECUBITAL Performed at [facility], [place] [address], [place], [place] [address]    SPECREQUEST  03/19/2020 1647    BOTTLES DRAWN AEROBIC ONLY Blood Culture adequate volume Performed at [facility] [address] [place], [place] [address]    CULT  03/19/2020 1647    NO GROWTH 5 DAYS Performed at [facility] [address], [place], [place] [address]    REPTSTATUS 03/24/2020 FINAL 03/19/2020 1647    Other culture-see note  Medications: Scheduled Meds: . enoxaparin (LOVENOX)  injection  40 mg Subcutaneous Q24H  . glipiZIDE  10 mg Oral QAC breakfast  . insulin aspart  0-9 Units Subcutaneous TID WC  . insulin aspart  4 Units Subcutaneous TID WC  . insulin glargine  15 Units Subcutaneous QHS  . losartan  25 mg Oral Daily  . melatonin  3 mg Oral QHS  . metoprolol tartrate  12.5 mg Oral BID  . nystatin   Topical BID  . senna-docusate  1 tablet Oral BID  . sodium chloride flush  3 mL Intravenous Q12H   Continuous Infusions: . sodium chloride    . ciprofloxacin      Antimicrobials: Anti-infectives (From admission, onward)   Start     Dose/Rate Route Frequency Ordered Stop   03/25/20 0600  ciprofloxacin (CIPRO) IVPB 400  mg        400 mg 200 mL/hr over 60 Minutes Intravenous On call to O.R. 03/24/20 1509 03/26/20 0559   03/16/20 0415  doxycycline (VIBRAMYCIN) 100 mg in sodium chloride 0.9 % 250 mL IVPB        100 mg 125 mL/hr over 120 Minutes Intravenous  Once 03/16/20 0407 03/16/20 0637    Objective: Vitals: Today's Vitals   03/25/20 0404 03/25/20 0434 03/25/20 0619 03/25/20 1053  BP:   117/65   Pulse:   70   Resp:   16   Temp:   98 F (36.7 C)   TempSrc:   Oral   SpO2:   100%   Weight:      Height:      PainSc: 7  Asleep  3    No intake or output data in the 24 hours ending 03/25/20 1439 Filed Weights   03/16/20 0539  Weight: 99.8 kg   Weight change:   Intake/Output from previous day: 12/02 0701 - 12/03 0700 In: 480 [P.O.:480] Out: -  Intake/Output this shift: No intake/output data recorded.  Examination: General exam:AAOx3,NAD,weak appearing. HEENT:Oral mucosa moist, Ear/Nose WNL grossly, dentition normal. Respiratory system:Bilaterally clear,no wheezing or crackles,no use of accessory muscle Cardiovascular system:S1 & S2 +, No JVD,. Gastrointestinal system:Abdomen soft, NT,ND, BS+ Nervous System:Alert, awake, moving extremities and grossly nonfocal Extremities:No edema, distal peripheral pulses palpable.slightly swollen and tender bilateral second and third metacarpophalangeal joints. Skin:No rashes,no icterus. ELF:YBOFBP muscle bulk,tone, power Data Reviewed: I have personally reviewed following labs and imaging studies CBC: Recent Labs  Lab 03/21/20 0727 03/22/20 0635 03/23/20 0624 03/24/20 0627 03/25/20 0612  WBC 18.7* 19.9* 19.3* 18.2* 19.2*  NEUTROABS 3.4  --   --  3.5 3.5  HGB 14.0 13.9 14.1 13.7 13.6  HCT 39.4 39.4 39.7 40.1 39.5  MCV 86.6 87.2 87.4 88.9 87.0  PLT 213 223 232 231 102   Basic Metabolic Panel: Recent Labs  Lab 03/21/20 0727 03/22/20 0635 03/23/20 0624 03/24/20 0627 03/25/20 0612  NA 135 134* 135 136 134*  K 4.1 4.4 4.3 4.2 4.3  CL 101  101 102 105 103  CO2 '24 24 25 23 24  ' GLUCOSE 225* 220* 262* 194* 147*  BUN 19 19 25* 22* 15  CREATININE 0.77 0.82 0.81 0.76 0.69  CALCIUM 8.9 8.9 8.9 8.9 8.8*   GFR: Estimated Creatinine Clearance: 114.9 mL/min (by C-G formula based on SCr of 0.69 mg/dL). Liver Function Tests: Recent Labs  Lab 03/21/20 0727 03/22/20 0635 03/23/20 0624 03/24/20 0627 03/25/20 0612  AST '21 17 17 17 ' 12*  ALT '22 19 18 17 16  ' ALKPHOS 75 71 84 73 65  BILITOT 0.6 0.9  0.7 0.6 0.7  PROT 6.5 6.4* 6.5 6.1* 6.0*  ALBUMIN 3.2* 3.1* 3.2* 3.1* 2.9*   No results for input(s): LIPASE, AMYLASE in the last 168 hours. No results for input(s): AMMONIA in the last 168 hours. Coagulation Profile: No results for input(s): INR, PROTIME in the last 168 hours. Cardiac Enzymes: Recent Labs  Lab 03/21/20 0727  CKTOTAL 428*   BNP (last 3 results) No results for input(s): PROBNP in the last 8760 hours. HbA1C: No results for input(s): HGBA1C in the last 72 hours. CBG: Recent Labs  Lab 03/24/20 1134 03/24/20 1641 03/24/20 2134 03/25/20 0758 03/25/20 1159  GLUCAP 165* 152* 119* 147* 146*   Lipid Profile: No results for input(s): CHOL, HDL, LDLCALC, TRIG, CHOLHDL, LDLDIRECT in the last 72 hours. Thyroid Function Tests: No results for input(s): TSH, T4TOTAL, FREET4, T3FREE, THYROIDAB in the last 72 hours. Anemia Panel: No results for input(s): VITAMINB12, FOLATE, FERRITIN, TIBC, IRON, RETICCTPCT in the last 72 hours. Sepsis Labs: No results for input(s): PROCALCITON, LATICACIDVEN in the last 168 hours.  Recent Results (from the past 240 hour(s))  Resp Panel by RT-PCR (Flu A&B, Covid) Nasopharyngeal Swab     Status: None   Collection Time: 03/16/20 12:04 AM   Specimen: Nasopharyngeal Swab; Nasopharyngeal(NP) swabs in vial transport medium  Result Value Ref Range Status   SARS Coronavirus 2 by RT PCR NEGATIVE NEGATIVE Final    Comment: (NOTE) SARS-CoV-2 target nucleic acids are NOT DETECTED.  The  SARS-CoV-2 RNA is generally detectable in upper respiratory specimens during the acute phase of infection. The lowest concentration of SARS-CoV-2 viral copies this assay can detect is 138 copies/mL. A negative result does not preclude SARS-Cov-2 infection and should not be used as the sole basis for treatment or other patient management decisions. A negative result may occur with  improper specimen collection/handling, submission of specimen other than nasopharyngeal swab, presence of viral mutation(s) within the areas targeted by this assay, and inadequate number of viral copies(<138 copies/mL). A negative result must be combined with clinical observations, patient history, and epidemiological information. The expected result is Negative.  Fact Sheet for Patients:  EntrepreneurPulse.com.au  Fact Sheet for Healthcare Providers:  IncredibleEmployment.be  This test is no t yet approved or cleared by the [place] FDA and  has been authorized for detection and/or diagnosis of SARS-CoV-2 by FDA under an Emergency Use Authorization (EUA). This EUA will remain  in effect (meaning this test can be used) for the duration of the COVID-19 declaration under Section 564(b)(1) of the Act, 21 U.S.C.section 360bbb-3(b)(1), unless the authorization is terminated  or revoked sooner.       Influenza A by PCR NEGATIVE NEGATIVE Final   Influenza B by PCR NEGATIVE NEGATIVE Final    Comment: (NOTE) The Xpert Xpress SARS-CoV-2/FLU/RSV plus assay is intended as an aid in the diagnosis of influenza from Nasopharyngeal swab specimens and should not be used as a sole basis for treatment. Nasal washings and aspirates are unacceptable for Xpert Xpress SARS-CoV-2/FLU/RSV testing.  Fact Sheet for Patients: EntrepreneurPulse.com.au  Fact Sheet for Healthcare Providers: IncredibleEmployment.be  This test is not yet approved or  cleared by the [place] FDA and has been authorized for detection and/or diagnosis of SARS-CoV-2 by FDA under an Emergency Use Authorization (EUA). This EUA will remain in effect (meaning this test can be used) for the duration of the COVID-19 declaration under Section 564(b)(1) of the Act, 21 U.S.C. section 360bbb-3(b)(1), unless the authorization is terminated or revoked.  Performed  at [facility], [place] [address], [place], [place] [address]   Culture, blood (routine x 2)     Status: None   Collection Time: 03/16/20  7:55 AM   Specimen: BLOOD  Result Value Ref Range Status   Specimen Description   Final    BLOOD LEFT ANTECUBITAL Performed at [facility] [address] [place], [place] [address]    Special Requests   Final    BOTTLES DRAWN AEROBIC ONLY Blood Culture adequate volume Performed at [facility] [address], [place], [place] [address]    Culture   Final    NO GROWTH 5 DAYS Performed at [facility], [place] [address] [place], [place] [address]    Report Status 03/21/2020 FINAL  Final  Culture, blood (routine x 2)     Status: None   Collection Time: 03/16/20  8:10 AM   Specimen: BLOOD  Result Value Ref Range Status   Specimen Description   Final    BLOOD LEFT ANTECUBITAL Performed at [facility] [address], [place], [place] [address]    Special Requests   Final    BOTTLES DRAWN AEROBIC ONLY Blood Culture results may not be optimal due to an inadequate volume of blood received in culture bottles Performed at [facility] [address], [place], [place] [address]    Culture   Final    NO GROWTH 5 DAYS Performed at [facility], [place] [address] [place], [place] [address]    Report Status 03/21/2020 FINAL  Final  Culture, blood (routine x 2)     Status: None   Collection Time: 03/19/20  4:39 PM   Specimen: BLOOD  Result Value Ref Range  Status   Specimen Description   Final    BLOOD LEFT ANTECUBITAL Performed at [facility] [address], [place], [place] [address]    Special Requests   Final    BOTTLES DRAWN AEROBIC ONLY Blood Culture adequate volume Performed at [facility] [address] [place], [place] [address]    Culture   Final    NO GROWTH 5 DAYS Performed at [facility], [place] [address], [place], [place] [address]    Report Status 03/24/2020 FINAL  Final  Culture, blood (routine x 2)     Status: None   Collection Time: 03/19/20  4:47 PM   Specimen: BLOOD  Result Value Ref Range Status   Specimen Description   Final    BLOOD LEFT ANTECUBITAL Performed at [facility] [address], [place], [place] [address]    Special Requests   Final    BOTTLES DRAWN AEROBIC ONLY Blood Culture adequate volume Performed at [facility] [address], [place], [place] [address]    Culture   Final    NO GROWTH 5 DAYS Performed at [facility], [place] [address], [place],  [address]    Report Status 03/24/2020 FINAL  Final     Radiology Studies: No results found.   LOS: 8 days   Antonieta Pert, MD [facility]  03/25/2020, 2:39 PM

## 2020-03-26 LAB — GLUCOSE, CAPILLARY
Glucose-Capillary: 149 mg/dL — ABNORMAL HIGH (ref 70–99)
Glucose-Capillary: 138 mg/dL — ABNORMAL HIGH (ref 70–99)
Glucose-Capillary: 164 mg/dL — ABNORMAL HIGH (ref 70–99)

## 2020-03-26 LAB — LACTATE DEHYDROGENASE: LDH: 332 U/L — ABNORMAL HIGH (ref 98–192)

## 2020-03-26 NOTE — Progress Notes (Signed)
PROGRESS NOTE    Kaitlin Holloway  QQV:[id number] DOB: Oct 17, 1970 DOA: 03/15/2020 PCP: Mack Hook, MD   Chief Complaint  Patient presents with  . Joint Pain    Brief Narrative: 49 year old female with past medical history of T2DM, has not been taking her medication, comes to the ED for evaluation of shortness of breath/ joint pain. As per report Ms Riffel reported she thinks she contracted Covid March 2020,she  Reported she was in perfect health condition doing regular exercise a few times a week prior to that.However after March 2020 she started to have diffuse joint pain muscle aches,weakness, she has been taking ibuprofen 1-3 times per day for more than a year now, she noticed she is getting slower, and weaker she cannot make a fist due to stiffness and weakness, she will report proximal muscle weakness is harder for her to raise arms , harder for her to get from sitting position to stand up position .she presented to the hospital due to significant upper back pain to the point she cannot breath. Patient had managed her diabetes with yoga and dietary changes due to mistrust of the medical system. 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 Seen by neurology, hematology and undergoing rheumatologic work-up:CRP 3.5>2.7, ESR 28>35. LDH 385> 360. rpr neg, uric acid 4.1 nl, cytology no eosinophilia,Anti Jo av Ig < 0.2, CCP IGA/IGA:176, RF > 650, ANA NEG, aldolase +20.4, SPEPE no monoclonal protein.  UPEP/UIFE/Light chain pending.Patient underwent bone  marrow biopsy after being seen by hematology due to persistent leukocytosis and eosinophilia-report is not conclusive as per hematology and not sure if it is primary I;e hypereosinophilic syndrome or reactive due to collagen vascular disease.  Subjective:  Seen this morning, had episode of vomiting after eating and was feeling uneasy. No other new complaints.  Assessment & Plan:  Generalized body weight with polyarthralgia, bilateral ankle swelling, stiffness in the hands and proximal muscle weakness, small joint tenderness and tenderness on upper thoracic spine: Patient had extensive work-up  with CT spine, MRI spine, see report for details.  No PE on CT, no DVT on duplex, bilateral ankle x-ray with degenerative changes, lab with mild rhabdomyolysis. 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 MRI lumbar spine-disc and facet degeneration.labs-mildly elevated ESR and CRP, ANA negative, anti-Jo 1 negative CRP  3.5>2.7,ESR mildly elevated  28>35. LDH 385> 360. rpr neg, uric acid 4.1 nl, cytology no eosinophilia,CCP IGA/IGA:176, RF > 650,CK 1219> 773>400, aldolase 20.4.  Rheumatology at [facility] were contacted, no bed for transfer, seen by hematology and neurology here underwent bone marrow biopsy 11/29. normocellular for age with prominent eosinophilic proliferation.Not conclusive as per hematology and not sure if it is primary i;e hypereosinophilic syndrome or reactive due to collagen vascular disease.  Hematology in favor of empiric steroid. On discussion with rheumatology at [facility] muscle biopsy then trail of steroid 1 mg/kg.Gen surgery  consulted 12/2 for muscle biopsy planned for Monday. I did send ambulatory referral to rheumatology clinic Dr. Alfonso Ramus.  No indication for inpatient transfer, but no bed as well.  Deconditioning from #1: Ambulate as tolerated.  Dyspnea on admission,  currently improved:CTA chest no acute finding.Echocardiogram with a  small pericardial effusion otherwise unremarkable.  Saturating well on room air.   SVT ? nonsustained initially : Doing well on Lopressor.  No recurrence.TSH stable.    Non-insulin dependent diabetes mellitus, not on medication at home.  Started on glipizide 10 mg, also premeal insulin and Lantus, sliding scale.  Blood sugar is well controlled.  States electing to begin insulin at home has been extensively counseled.She has not been taking medication given some mistrust in her medical system Recent Labs  Lab 03/25/20 1159 03/25/20 1817 03/25/20 2040 03/26/20 0722 03/26/20 1158  GLUCAP 146* 143* 104* 149* 138*   Recent history of cataract surgery on right eye.no issues.  Leukocytosis persistent with Eosinophil 13.0, afebrile, underwent BM biopsy"Normocellular bone marrow for age with prominent eosinophilic Proliferation".procal was 0.1 normal on 03/16/20, no fever. 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   Patient made a comment to Dr Maxie Better we didn't figure it out soon she wanted to just die" Neurology was consulted and currently denies suicidal ideation and has been cleared.  Nutrition: Diet Order            Diet NPO time specified Except for: Ice Chips, Sips with Meds  Diet effective midnight           Diet regular Room service appropriate? Yes; Fluid consistency: Thin  Diet effective now                 Body mass index is 28.25 kg/m.  DVT prophylaxis: enoxaparin (LOVENOX) injection 40 mg Start: 03/25/20 1000 Place and maintain sequential compression device Start: 03/17/20 1714 Code Status:  Code Status: Full Code  Family Communication:Plan of care discussed with patient at bedside.  Status is: Inpatient Remains inpatient  appropriate because:Ongoing diagnostic testing needed not appropriate for outpatient work up and Inpatient level of care appropriate due to severity of illness  Dispo: The patient is from: Home              Anticipated d/c is to: Home.              Anticipated d/c date is: 3 days              Patient currently is not medically stable to d/c.  Consultants:see note  Procedures:see note  Culture/Microbiology    Component Value Date/Time   SDES  03/19/2020 1647    BLOOD LEFT ANTECUBITAL Performed at [facility], [place] [address] [place], [place] [address]    SPECREQUEST  03/19/2020 1647    BOTTLES DRAWN AEROBIC ONLY Blood Culture adequate volume Performed at [facility] [address], [place], [place] [address]    CULT  03/19/2020 1647    NO GROWTH 5 DAYS Performed at [facility] [address], [place],  [address]    REPTSTATUS 03/24/2020 FINAL 03/19/2020 1647    Other culture-see note  Medications: Scheduled Meds: . enoxaparin (LOVENOX) injection  40 mg Subcutaneous Q24H  . glipiZIDE  10 mg Oral QAC breakfast  . insulin aspart  0-9 Units Subcutaneous TID WC  . insulin aspart  4 Units Subcutaneous TID WC  . insulin glargine  15 Units Subcutaneous QHS  . losartan  25 mg  Oral Daily  . melatonin  3 mg Oral QHS  . metoprolol tartrate  12.5 mg Oral BID  . nystatin   Topical BID  . senna-docusate  1 tablet Oral BID  . sodium chloride flush  3 mL Intravenous Q12H   Continuous Infusions: . sodium chloride      Antimicrobials: Anti-infectives (From admission, onward)   Start     Dose/Rate Route Frequency Ordered Stop   03/25/20 0600  ciprofloxacin (CIPRO) IVPB 400 mg        400 mg 200 mL/hr over 60 Minutes Intravenous On call to O.R. 03/24/20 1509 03/26/20 0559   03/16/20 0415  doxycycline (VIBRAMYCIN) 100 mg in sodium chloride 0.9 % 250 mL IVPB        100 mg 125 mL/hr over 120 Minutes Intravenous  Once 03/16/20 0407  03/16/20 0637    Objective: Vitals: Today's Vitals   03/26/20 0146 03/26/20 0517 03/26/20 0544 03/26/20 0920  BP:  135/82    Pulse:  76    Resp:  16    Temp:  97.7 F (36.5 C)    TempSrc:  Oral    SpO2:  100%    Weight:      Height:      PainSc: 4   Asleep 4     Intake/Output Summary (Last 24 hours) at 03/26/2020 1320 Last data filed at 03/25/2020 2100 Gross per 24 hour  Intake 480 ml  Output --  Net 480 ml   Filed Weights   03/16/20 0539  Weight: 99.8 kg   Weight change:   Intake/Output from previous day: 12/03 0701 - 12/04 0700 In: 480 [P.O.:480] Out: -  Intake/Output this shift: No intake/output data recorded.  Examination: General exam: AAOx3,NAD,weak appearing. HEENT:Oral mucosa moist, Ear/Nose WNL grossly, dentition normal. Respiratory system: bilaterally clear,no wheezing or crackles,no use of accessory muscle Cardiovascular system: S1 & S2 +, No JVD,. Gastrointestinal system: Abdomen soft, NT,ND, BS+ Nervous System:Alert, awake, moving extremities and grossly nonfocal Extremities: No edema, distal peripheral pulses palpable. Tender b/l 2,3 MCP joints. Skin: No rashes,no icterus. MSK: Normal muscle bulk,tone, power  Data Reviewed: I have personally reviewed following labs and imaging studies CBC: Recent Labs  Lab 03/21/20 0727 03/22/20 0635 03/23/20 0624 03/24/20 0627 03/25/20 0612  WBC 18.7* 19.9* 19.3* 18.2* 19.2*  NEUTROABS 3.4  --   --  3.5 3.5  HGB 14.0 13.9 14.1 13.7 13.6  HCT 39.4 39.4 39.7 40.1 39.5  MCV 86.6 87.2 87.4 88.9 87.0  PLT 213 223 232 231 676   Basic Metabolic Panel: Recent Labs  Lab 03/21/20 0727 03/22/20 0635 03/23/20 0624 03/24/20 0627 03/25/20 0612  NA 135 134* 135 136 134*  K 4.1 4.4 4.3 4.2 4.3  CL 101 101 102 105 103  CO2 '24 24 25 23 24  ' GLUCOSE 225* 220* 262* 194* 147*  BUN 19 19 25* 22* 15  CREATININE 0.77 0.82 0.81 0.76 0.69  CALCIUM 8.9 8.9 8.9 8.9 8.8*   GFR: Estimated Creatinine Clearance: 114.9  mL/min (by C-G formula based on SCr of 0.69 mg/dL). Liver Function Tests: Recent Labs  Lab 03/21/20 0727 03/22/20 0635 03/23/20 0624 03/24/20 0627 03/25/20 0612  AST '21 17 17 17 ' 12*  ALT '22 19 18 17 16  ' ALKPHOS 75 71 84 73 65  BILITOT 0.6 0.9 0.7 0.6 0.7  PROT 6.5 6.4* 6.5 6.1* 6.0*  ALBUMIN 3.2* 3.1* 3.2* 3.1* 2.9*   No results for input(s): LIPASE, AMYLASE in the last 168 hours. No  results for input(s): AMMONIA in the last 168 hours. Coagulation Profile: No results for input(s): INR, PROTIME in the last 168 hours. Cardiac Enzymes: Recent Labs  Lab 03/21/20 0727  CKTOTAL 428*   BNP (last 3 results) No results for input(s): PROBNP in the last 8760 hours. HbA1C: No results for input(s): HGBA1C in the last 72 hours. CBG: Recent Labs  Lab 03/25/20 1159 03/25/20 1817 03/25/20 2040 03/26/20 0722 03/26/20 1158  GLUCAP 146* 143* 104* 149* 138*   Lipid Profile: No results for input(s): CHOL, HDL, LDLCALC, TRIG, CHOLHDL, LDLDIRECT in the last 72 hours. Thyroid Function Tests: No results for input(s): TSH, T4TOTAL, FREET4, T3FREE, THYROIDAB in the last 72 hours. Anemia Panel: No results for input(s): VITAMINB12, FOLATE, FERRITIN, TIBC, IRON, RETICCTPCT in the last 72 hours. Sepsis Labs: No results for input(s): PROCALCITON, LATICACIDVEN in the last 168 hours.  Recent Results (from the past 240 hour(s))  Culture, blood (routine x 2)     Status: None   Collection Time: 03/19/20  4:39 PM   Specimen: BLOOD  Result Value Ref Range Status   Specimen Description   Final    BLOOD LEFT ANTECUBITAL Performed at [facility] [address] [place], [place] [address]    Special Requests   Final    BOTTLES DRAWN AEROBIC ONLY Blood Culture adequate volume Performed at [facility] [address] [place], [place] [address]    Culture   Final    NO GROWTH 5 DAYS Performed at [facility], [place] [address] [place], [place]  [address]    Report Status 03/24/2020 FINAL  Final  Culture, blood (routine x 2)     Status: None   Collection Time: 03/19/20  4:47 PM   Specimen: BLOOD  Result Value Ref Range Status   Specimen Description   Final    BLOOD LEFT ANTECUBITAL Performed at [facility] [address] [place], [place] [address]    Special Requests   Final    BOTTLES DRAWN AEROBIC ONLY Blood Culture adequate volume Performed at [facility] [address] [place], [place] [address]    Culture   Final    NO GROWTH 5 DAYS Performed at [facility], [place] [address] [place], [place] [address]    Report Status 03/24/2020 FINAL  Final     Radiology Studies: No results found.   LOS: 9 days   Antonieta Pert, MD [facility]  03/26/2020, 1:20 PM

## 2020-03-27 LAB — GLUCOSE, CAPILLARY
Glucose-Capillary: 216 mg/dL — ABNORMAL HIGH (ref 70–99)
Glucose-Capillary: 154 mg/dL — ABNORMAL HIGH (ref 70–99)
Glucose-Capillary: 126 mg/dL — ABNORMAL HIGH (ref 70–99)
Glucose-Capillary: 226 mg/dL — ABNORMAL HIGH (ref 70–99)

## 2020-03-27 LAB — CBC
HCT: 37.1 % (ref 36.0–46.0)
Hemoglobin: 13 g/dL (ref 12.0–15.0)
MCH: 30.7 pg (ref 26.0–34.0)
MCHC: 35 g/dL (ref 30.0–36.0)
MCV: 87.7 fL (ref 80.0–100.0)
Platelets: 246 10*3/uL (ref 150–400)
RBC: 4.23 MIL/uL (ref 3.87–5.11)
RDW: 13.2 % (ref 11.5–15.5)
WBC: 18.6 10*3/uL — ABNORMAL HIGH (ref 4.0–10.5)
nRBC: 0 % (ref 0.0–0.2)

## 2020-03-27 LAB — LACTATE DEHYDROGENASE: LDH: 311 U/L — ABNORMAL HIGH (ref 98–192)

## 2020-03-27 MED ORDER — CEFAZOLIN SODIUM-DEXTROSE 2-4 GM/100ML-% IV SOLN
2.0000 g | INTRAVENOUS | Status: AC
  Administered 2020-03-28: 2 g via INTRAVENOUS
  Filled 2020-03-27 (×2): qty 100

## 2020-03-27 NOTE — Progress Notes (Signed)
PROGRESS NOTE    Kaitlin Holloway  ZOX:[id number] DOB: [birth year] DOA: 03/15/2020 PCP: Mack Hook, MD   Chief Complaint  Patient presents with  . Joint Pain    Brief Narrative: 49 year old female with past medical history of T2DM, has not been taking her medication, comes to the ED for evaluation of shortness of breath/ joint pain. As per report Ms Alred reported she thinks she contracted Covid March 2020,she  Reported she was in perfect health condition doing regular exercise a few times a week prior to that.However after March 2020 she started to have diffuse joint pain muscle aches,weakness, she has been taking ibuprofen 1-3 times per day for more than a year now, she noticed she is getting slower, and weaker she cannot make a fist due to stiffness and weakness, she will report proximal muscle weakness is harder for her to raise arms , harder for her to get from sitting position to stand up position .she presented to the hospital due to significant upper back pain to the point she cannot breath. Patient had managed her diabetes with yoga and dietary changes due to mistrust of the medical system. 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 Seen by neurology, hematology and undergoing rheumatologic work-up:CRP 3.5>2.7, ESR 28>35. LDH 385> 360. rpr neg, uric acid 4.1 nl, cytology no eosinophilia,Anti Jo av Ig < 0.2, CCP IGA/IGA:176, RF > 650, ANA NEG, aldolase +20.4, SPEPE no monoclonal protein.  UPEP/UIFE/Light chain pending.Patient underwent bone  marrow biopsy after being seen by hematology due to persistent leukocytosis and eosinophilia-report is not conclusive as per hematology and not sure if it is primary I;e hypereosinophilic syndrome or reactive due to collagen vascular disease.  Subjective: Seen this morning.  Patient reports she is having pain proximal muscle, upper back in between scapula after see exerted by trying to lift herself up from the restroom using her upper arm strength. Tender and painful bilateral first second and third proximal knuckes  Assessment & Plan:  Generalized body weight with polyarthralgia, bilateral ankle swelling, stiffness in the hands and proximal muscle weakness, small joint tenderness and tenderness on upper thoracic spine: Patient had extensive work-up  with CT spine, MRI spine, see report for details.  No PE on CT, no DVT on duplex, bilateral ankle x-ray with degenerative changes, lab with mild rhabdomyolysis. 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 MRI lumbar spine-disc and facet degeneration.labs-mildly elevated ESR and CRP, ANA negative, anti-Jo 1 negative CRP  3.5>2.7,ESR mildly elevated  28>35. LDH 385> 360. rpr neg, uric acid 4.1 nl, cytology no eosinophilia,CCP IGA/IGA:176, RF > 650,CK 1219> 773>400, aldolase 20.4.  Rheumatology at [facility] were contacted, no bed for transfer, seen by hematology and neurology here underwent bone marrow biopsy 11/29. normocellular for age with prominent eosinophilic proliferation.Not conclusive as per hematology and not sure if it is primary i;e hypereosinophilic syndrome or reactive due to collagen vascular disease.  Hematology in favor of empiric steroid. On discussion with rheumatology at [facility] muscle biopsy then trail of steroid 1 mg/kg, suspecting overlap rheumatoid arthritis with myositis. Gen surgery  consulted 12/2 for muscle biopsy and likely  will have opening today.  Respiratory status appears stable,  doing well on room air, vitals stable no chest pain with activity and ambulation in the hallway.  Will likely need to monitor her blood sugar after being on steroids. I did send ambulatory referral to rheumatology clinic Dr. Alfonso Ramus.  Patient has no diplopia/dysphagia or difficulty with respiratory muscles.  Deconditioning from #1: Supportive care PT OT eval  Dyspnea on admission, currently improved:CTA chest no acute finding.Echocardiogram with a small pericardial effusion.  Respiratory status is stable.   SVT ? nonsustained initially : Doing well on Lopressor.  No recurrence.TSH stable.    Non-insulin dependent diabetes mellitus, not on medication at home.  Started on glipizide 10 mg, also premeal insulin and Lantus, sliding scale.  Blood sugar fairly stable, hold her meds except sliding scale if n.p.o. patient may need to go home on insulin if sugar remains poorly controlled especially on steroids, this was discussed with she seems to be more agreeable with it now. She has not been taking medication given some mistrust in her medical system. Recent Labs  Lab 03/26/20 0722 03/26/20 1158 03/26/20 1657 03/26/20 2115 03/27/20 0802  GLUCAP 149* 138* 164* 216* 154*   Recent history of cataract surgery on right eye.no issues.  Leukocytosis persistent with Eosinophil 13.0, afebrile, underwent BM biopsy"Normocellular bone marrow for age with prominent eosinophilic Proliferation".procal was 0.1 normal on 03/16/20 she has been afebrile. Recent Labs  Lab 03/22/20 0635 03/23/20 0624 03/24/20 0627 03/25/20 0612 03/27/20 0635  WBC 19.9* 19.3* 18.2* 19.2* 18.6*   Hypertension: Controlled on losartan and metoprolol.She was on clonidine 2015 and she was told clonidine caused her spleen to rupture.BP is controlled.   Patient made a comment to Dr Maxie Better we didn't figure it out soon she wanted to just die" Neurology was consulted and currently denies suicidal ideation and has been  cleared.  Nutrition: Diet Order            Diet NPO time specified Except for: Ice Chips, Sips with Meds  Diet effective midnight                 Body mass index is 28.25 kg/m.  DVT prophylaxis: enoxaparin (LOVENOX) injection 40 mg Start: 03/25/20 1000 Place and maintain sequential compression device Start: 03/17/20 1714 Code Status:  Code Status: Full Code  Family Communication:Plan of care discussed with patient at bedside.  Status is: Inpatient Remains inpatient appropriate because:Ongoing diagnostic testing needed not appropriate for outpatient work up and Inpatient level of care appropriate due to severity of illness  Dispo: The patient is from: Home              Anticipated d/c is to: Home.              Anticipated d/c date is: 3 days              Patient currently is not medically stable to d/c.  Consultants:see note  Procedures:see note  Culture/Microbiology    Component Value Date/Time   SDES  03/19/2020 1647    BLOOD LEFT ANTECUBITAL Performed at [facility], [place] [address] [place], [place] [address]    SPECREQUEST  03/19/2020 1647    BOTTLES DRAWN AEROBIC ONLY Blood Culture adequate volume Performed at [facility] [address] [place], [place] [address]    CULT  03/19/2020 1647    NO GROWTH 5 DAYS Performed at [facility] [address], [place], [place] [address]  REPTSTATUS 03/24/2020 FINAL 03/19/2020 1647    Other culture-see note  Medications: Scheduled Meds: . enoxaparin (LOVENOX) injection  40 mg Subcutaneous Q24H  . glipiZIDE  10 mg Oral QAC breakfast  . insulin aspart  0-9 Units Subcutaneous TID WC  . insulin aspart  4 Units Subcutaneous TID WC  . insulin glargine  15 Units Subcutaneous QHS  . losartan  25 mg Oral Daily  . melatonin  3 mg Oral QHS  . metoprolol tartrate  12.5 mg Oral BID  . nystatin   Topical BID  . senna-docusate  1 tablet Oral BID  . sodium chloride flush  3 mL  Intravenous Q12H   Continuous Infusions: . sodium chloride    . [START ON 03/28/2020]  ceFAZolin (ANCEF) IV      Antimicrobials: Anti-infectives (From admission, onward)   Start     Dose/Rate Route Frequency Ordered Stop   03/28/20 0915  ceFAZolin (ANCEF) IVPB 2g/100 mL premix        2 g 200 mL/hr over 30 Minutes Intravenous On call to O.R. 03/27/20 0827 03/29/20 0559   03/25/20 0600  ciprofloxacin (CIPRO) IVPB 400 mg        400 mg 200 mL/hr over 60 Minutes Intravenous On call to O.R. 03/24/20 1509 03/26/20 0559   03/16/20 0415  doxycycline (VIBRAMYCIN) 100 mg in sodium chloride 0.9 % 250 mL IVPB        100 mg 125 mL/hr over 120 Minutes Intravenous  Once 03/16/20 0407 03/16/20 0637    Objective: Vitals: Today's Vitals   03/27/20 0643 03/27/20 0900 03/27/20 0945 03/27/20 0946  BP:   134/85 134/85  Pulse:   96 96  Resp:      Temp:      TempSrc:      SpO2:   100%   Weight:      Height:      PainSc: Asleep 9       Intake/Output Summary (Last 24 hours) at 03/27/2020 1151 Last data filed at 03/27/2020 1139 Gross per 24 hour  Intake 0 ml  Output --  Net 0 ml   Filed Weights   03/16/20 0539  Weight: 99.8 kg   Weight change:   Intake/Output from previous day: No intake/output data recorded. Intake/Output this shift: No intake/output data recorded.  Examination: General exam: AAOx3 , NAD, weak appearing. HEENT:Oral mucosa moist, Ear/Nose WNL grossly, dentition normal. Respiratory system: bilaterally clear,no wheezing or crackles,no use of accessory muscle Cardiovascular system: S1 & S2 +, No JVD,. Gastrointestinal system: Abdomen soft, NT,ND, BS+ Nervous System:Alert, awake, moving extremities and grossly nonfocal Extremities: No edema, distal peripheral pulses palpable.  Tender MCP joint first second third bilaterally. Skin: No rashes,no icterus. MSK: Normal muscle bulk,tone, power   Data Reviewed: I have personally reviewed following labs and imaging  studies CBC: Recent Labs  Lab 03/21/20 0727 03/21/20 0727 03/22/20 0635 03/23/20 0624 03/24/20 0627 03/25/20 0612 03/27/20 0635  WBC 18.7*   < > 19.9* 19.3* 18.2* 19.2* 18.6*  NEUTROABS 3.4  --   --   --  3.5 3.5  --   HGB 14.0   < > 13.9 14.1 13.7 13.6 13.0  HCT 39.4   < > 39.4 39.7 40.1 39.5 37.1  MCV 86.6   < > 87.2 87.4 88.9 87.0 87.7  PLT 213   < > 223 232 231 238 246   < > = values in this interval not displayed.   Basic Metabolic Panel: Recent Labs  Lab 03/21/20  4132 03/22/20 0635 03/23/20 0624 03/24/20 0627 03/25/20 0612  NA 135 134* 135 136 134*  K 4.1 4.4 4.3 4.2 4.3  CL 101 101 102 105 103  CO2 '24 24 25 23 24  ' GLUCOSE 225* 220* 262* 194* 147*  BUN 19 19 25* 22* 15  CREATININE 0.77 0.82 0.81 0.76 0.69  CALCIUM 8.9 8.9 8.9 8.9 8.8*   GFR: Estimated Creatinine Clearance: 114.9 mL/min (by C-G formula based on SCr of 0.69 mg/dL). Liver Function Tests: Recent Labs  Lab 03/21/20 0727 03/22/20 0635 03/23/20 0624 03/24/20 0627 03/25/20 0612  AST '21 17 17 17 ' 12*  ALT '22 19 18 17 16  ' ALKPHOS 75 71 84 73 65  BILITOT 0.6 0.9 0.7 0.6 0.7  PROT 6.5 6.4* 6.5 6.1* 6.0*  ALBUMIN 3.2* 3.1* 3.2* 3.1* 2.9*   No results for input(s): LIPASE, AMYLASE in the last 168 hours. No results for input(s): AMMONIA in the last 168 hours. Coagulation Profile: No results for input(s): INR, PROTIME in the last 168 hours. Cardiac Enzymes: Recent Labs  Lab 03/21/20 0727  CKTOTAL 428*   BNP (last 3 results) No results for input(s): PROBNP in the last 8760 hours. HbA1C: No results for input(s): HGBA1C in the last 72 hours. CBG: Recent Labs  Lab 03/26/20 0722 03/26/20 1158 03/26/20 1657 03/26/20 2115 03/27/20 0802  GLUCAP 149* 138* 164* 216* 154*   Lipid Profile: No results for input(s): CHOL, HDL, LDLCALC, TRIG, CHOLHDL, LDLDIRECT in the last 72 hours. Thyroid Function Tests: No results for input(s): TSH, T4TOTAL, FREET4, T3FREE, THYROIDAB in the last 72  hours. Anemia Panel: No results for input(s): VITAMINB12, FOLATE, FERRITIN, TIBC, IRON, RETICCTPCT in the last 72 hours. Sepsis Labs: No results for input(s): PROCALCITON, LATICACIDVEN in the last 168 hours.  Recent Results (from the past 240 hour(s))  Culture, blood (routine x 2)     Status: None   Collection Time: 03/19/20  4:39 PM   Specimen: BLOOD  Result Value Ref Range Status   Specimen Description   Final    BLOOD LEFT ANTECUBITAL Performed at [facility] [address] [place], [place] [address]    Special Requests   Final    BOTTLES DRAWN AEROBIC ONLY Blood Culture adequate volume Performed at [facility] [address], [place], [place] [address]    Culture   Final    NO GROWTH 5 DAYS Performed at [facility], [place] [address] [place], [place] [address]    Report Status 03/24/2020 FINAL  Final  Culture, blood (routine x 2)     Status: None   Collection Time: 03/19/20  4:47 PM   Specimen: BLOOD  Result Value Ref Range Status   Specimen Description   Final    BLOOD LEFT ANTECUBITAL Performed at [facility] [address] [place], [place] [address]    Special Requests   Final    BOTTLES DRAWN AEROBIC ONLY Blood Culture adequate volume Performed at [facility] [address], [place], [place] [address]    Culture   Final    NO GROWTH 5 DAYS Performed at [facility], [place] [address], [place], [place] [address]    Report Status 03/24/2020 FINAL  Final     Radiology Studies: No results found.   LOS: 10 days   Antonieta Pert, MD [facility]  03/27/2020, 11:51 AM

## 2020-03-27 NOTE — Anesthesia Preprocedure Evaluation (Signed)
Anesthesia Evaluation  Patient identified by MRN, date of birth, ID band Patient awake    Reviewed: Allergy & Precautions, NPO status , Patient's Chart, lab work & pertinent test results  Airway Mallampati: II  TM Distance: >3 FB Neck ROM: Full    Dental no notable dental hx. (+) Teeth Intact, Dental Advisory Given   Pulmonary shortness of breath, former smoker,    Pulmonary exam normal breath sounds clear to auscultation       Cardiovascular hypertension, Pt. on medications Normal cardiovascular exam+ dysrhythmias Supra Ventricular Tachycardia  Rhythm:Regular Rate:Normal     Neuro/Psych Anxiety  Neuromuscular disease    GI/Hepatic negative GI ROS, Neg liver ROS,   Endo/Other  diabetes, Poorly Controlled, Type 2, Insulin Dependent  Renal/GU negative Renal ROS     Musculoskeletal  (+) Arthritis ,   Abdominal   Peds  Hematology   Anesthesia Other Findings   Reproductive/Obstetrics                           Anesthesia Physical Anesthesia Plan  ASA: II  Anesthesia Plan: General   Post-op Pain Management:    Induction: Intravenous  PONV Risk Score and Plan: 4 or greater and Treatment may vary due to age or medical condition, Ondansetron and Dexamethasone  Airway Management Planned: LMA  Additional Equipment: None  Intra-op Plan:   Post-operative Plan:   Informed Consent: I have reviewed the patients History and Physical, chart, labs and discussed the procedure including the risks, benefits and alternatives for the proposed anesthesia with the patient or authorized representative who has indicated his/her understanding and acceptance.     Dental advisory given  Plan Discussed with: CRNA  Anesthesia Plan Comments: (LMA GA)       Anesthesia Quick Evaluation

## 2020-03-27 NOTE — TOC Progression Note (Signed)
Transition of Care [facility]) - Progression Note    Patient Details  Name: Kaitlin Holloway MRN: [id number] Date of Birth: May 23, 1970  [facility] CM/SW Contact  Armanda Heritage, RN Phone Number: 03/27/2020, 10:22 AM  Clinical Narrative:    CM spoke with patient who shares concerns about her ability to work and pay her bills, shares she has been sick for a long time and the doctors are trying to figure out what she has.  Patient states she had Covid in 2020 but is not documented anywhere, she got sick right before the initial shut downs and has had problems ever since.  States she has been unable to get disability or unemployment.  CM referred patient out to [employer] for assistance with applying for medicaid and provided information about DSS on [address] and [place] housing authority which may have resources to help with her costs of living.   Expected Discharge Plan: Home/Self Care Barriers to Discharge: No Barriers Identified  Expected Discharge Plan and Services Expected Discharge Plan: Home/Self Care                                               Social Determinants of Health (SDOH) Interventions    Readmission Risk Interventions No flowsheet data found.

## 2020-03-28 ENCOUNTER — Encounter (HOSPITAL_COMMUNITY)
Admission: EM | Disposition: A | Discharge status: Home / Self Care | Payer: Self-pay | Source: Home / Self Care | Attending: Internal Medicine

## 2020-03-28 ENCOUNTER — Inpatient Hospital Stay (HOSPITAL_COMMUNITY): Payer: Self-pay | Admitting: Anesthesiology

## 2020-03-28 ENCOUNTER — Encounter (HOSPITAL_COMMUNITY): Payer: Self-pay | Admitting: Internal Medicine

## 2020-03-28 ENCOUNTER — Encounter (HOSPITAL_COMMUNITY): Payer: Self-pay | Admitting: Hematology & Oncology

## 2020-03-28 HISTORY — PX: MUSCLE BIOPSY: SHX716

## 2020-03-28 LAB — COMPREHENSIVE METABOLIC PANEL
ALT: 13 U/L (ref 0–44)
AST: 14 U/L — ABNORMAL LOW (ref 15–41)
Albumin: 3 g/dL — ABNORMAL LOW (ref 3.5–5.0)
Alkaline Phosphatase: 88 U/L (ref 38–126)
Anion gap: 10 (ref 5–15)
BUN: 22 mg/dL — ABNORMAL HIGH (ref 6–20)
CO2: 22 mmol/L (ref 22–32)
Calcium: 8.7 mg/dL — ABNORMAL LOW (ref 8.9–10.3)
Chloride: 103 mmol/L (ref 98–111)
Creatinine, Ser: 0.81 mg/dL (ref 0.44–1.00)
GFR, Estimated: >60 mL/min (ref >60)
Glucose, Bld: 289 mg/dL — ABNORMAL HIGH (ref 70–99)
Potassium: 3.7 mmol/L (ref 3.5–5.1)
Sodium: 135 mmol/L (ref 135–145)
Total Bilirubin: 0.4 mg/dL (ref 0.3–1.2)
Total Protein: 6.5 g/dL (ref 6.5–8.1)

## 2020-03-28 LAB — CBC WITH DIFFERENTIAL/PLATELET
Abs Immature Granulocytes: 0.02 10*3/uL (ref 0.00–0.07)
Basophils Absolute: 0.1 10*3/uL (ref 0.0–0.1)
Basophils Relative: 0 %
Eosinophils Absolute: 7.1 10*3/uL — ABNORMAL HIGH (ref 0.0–0.5)
Eosinophils Relative: 54 %
HCT: 37.8 % (ref 36.0–46.0)
Hemoglobin: 13 g/dL (ref 12.0–15.0)
Immature Granulocytes: 0 %
Lymphocytes Relative: 13 %
Lymphs Abs: 1.7 10*3/uL (ref 0.7–4.0)
MCH: 30.2 pg (ref 26.0–34.0)
MCHC: 34.4 g/dL (ref 30.0–36.0)
MCV: 87.9 fL (ref 80.0–100.0)
Monocytes Absolute: 0.5 10*3/uL (ref 0.1–1.0)
Monocytes Relative: 4 %
Neutro Abs: 3.9 10*3/uL (ref 1.7–7.7)
Neutrophils Relative %: 29 %
Platelets: 247 10*3/uL (ref 150–400)
RBC: 4.3 MIL/uL (ref 3.87–5.11)
RDW: 13.2 % (ref 11.5–15.5)
WBC: 13.2 10*3/uL — ABNORMAL HIGH (ref 4.0–10.5)
nRBC: 0 % (ref 0.0–0.2)

## 2020-03-28 LAB — GLUCOSE, CAPILLARY
Glucose-Capillary: 224 mg/dL — ABNORMAL HIGH (ref 70–99)
Glucose-Capillary: 220 mg/dL — ABNORMAL HIGH (ref 70–99)
Glucose-Capillary: 142 mg/dL — ABNORMAL HIGH (ref 70–99)
Glucose-Capillary: 320 mg/dL — ABNORMAL HIGH (ref 70–99)
Glucose-Capillary: 264 mg/dL — ABNORMAL HIGH (ref 70–99)

## 2020-03-28 LAB — CK: Total CK: 194 U/L (ref 38–234)

## 2020-03-28 LAB — LACTATE DEHYDROGENASE: LDH: 291 U/L — ABNORMAL HIGH (ref 98–192)

## 2020-03-28 SURGERY — MUSCLE BIOPSY
Anesthesia: General | Laterality: Left

## 2020-03-28 MED ORDER — LIDOCAINE 2% (20 MG/ML) 5 ML SYRINGE
INTRAMUSCULAR | Status: DC | PRN
  Administered 2020-03-28: 80 mg via INTRAVENOUS

## 2020-03-28 MED ORDER — DEXAMETHASONE SODIUM PHOSPHATE 10 MG/ML IJ SOLN
INTRAMUSCULAR | Status: DC | PRN
  Administered 2020-03-28: 5 mg via INTRAVENOUS

## 2020-03-28 MED ORDER — LACTATED RINGERS IV SOLN: INTRAVENOUS | Status: DC

## 2020-03-28 MED ORDER — MEPERIDINE HCL 50 MG/ML IJ SOLN: 6.2500 mg | INTRAMUSCULAR | Status: DC | PRN

## 2020-03-28 MED ORDER — 0.9 % SODIUM CHLORIDE (POUR BTL) OPTIME
TOPICAL | Status: DC | PRN
  Administered 2020-03-28: 1000 mL

## 2020-03-28 MED ORDER — AMISULPRIDE (ANTIEMETIC) 5 MG/2ML IV SOLN: 10.0000 mg | Freq: Once | INTRAVENOUS | Status: DC | PRN

## 2020-03-28 MED ORDER — FENTANYL CITRATE (PF) 250 MCG/5ML IJ SOLN
INTRAMUSCULAR | Status: AC
  Filled 2020-03-28: qty 5

## 2020-03-28 MED ORDER — EPHEDRINE SULFATE-NACL 50-0.9 MG/10ML-% IV SOSY
PREFILLED_SYRINGE | INTRAVENOUS | Status: DC | PRN
  Administered 2020-03-28: 10 mg via INTRAVENOUS

## 2020-03-28 MED ORDER — BUPIVACAINE-EPINEPHRINE (PF) 0.25% -1:200000 IJ SOLN
INTRAMUSCULAR | Status: AC
  Filled 2020-03-28: qty 30

## 2020-03-28 MED ORDER — HYDROCODONE-ACETAMINOPHEN 7.5-325 MG PO TABS: 1.0000 | ORAL_TABLET | Freq: Once | ORAL | Status: DC | PRN

## 2020-03-28 MED ORDER — HYDROMORPHONE HCL 1 MG/ML IJ SOLN: 0.2500 mg | INTRAMUSCULAR | Status: DC | PRN

## 2020-03-28 MED ORDER — PHENYLEPHRINE 40 MCG/ML (10ML) SYRINGE FOR IV PUSH (FOR BLOOD PRESSURE SUPPORT)
PREFILLED_SYRINGE | INTRAVENOUS | Status: DC | PRN
  Administered 2020-03-28: 80 ug via INTRAVENOUS
  Administered 2020-03-28: 120 ug via INTRAVENOUS

## 2020-03-28 MED ORDER — SCOPOLAMINE 1 MG/3DAYS TD PT72
MEDICATED_PATCH | TRANSDERMAL | Status: AC
  Administered 2020-03-28: 1.5 mg via TRANSDERMAL
  Filled 2020-03-28: qty 1

## 2020-03-28 MED ORDER — GLIPIZIDE 5 MG PO TABS
10.0000 mg | ORAL_TABLET | Freq: Two times a day (BID) | ORAL | Status: DC
  Administered 2020-03-28 – 2020-03-31 (×6): 10 mg via ORAL
  Filled 2020-03-28 (×6): qty 2

## 2020-03-28 MED ORDER — PROPOFOL 10 MG/ML IV BOLUS
INTRAVENOUS | Status: DC | PRN
  Administered 2020-03-28: 200 mg via INTRAVENOUS

## 2020-03-28 MED ORDER — ACETAMINOPHEN 10 MG/ML IV SOLN: 1000.0000 mg | Freq: Once | INTRAVENOUS | Status: DC | PRN

## 2020-03-28 MED ORDER — MIDAZOLAM HCL 2 MG/2ML IJ SOLN
INTRAMUSCULAR | Status: AC
  Filled 2020-03-28: qty 2

## 2020-03-28 MED ORDER — ONDANSETRON HCL 4 MG/2ML IJ SOLN: 4.0000 mg | Freq: Once | INTRAMUSCULAR | Status: DC | PRN

## 2020-03-28 MED ORDER — INSULIN GLARGINE 100 UNIT/ML ~~LOC~~ SOLN
20.0000 [IU] | Freq: Every day | SUBCUTANEOUS | Status: DC
  Administered 2020-03-28: 20 [IU] via SUBCUTANEOUS
  Filled 2020-03-28: qty 0.2

## 2020-03-28 MED ORDER — FENTANYL CITRATE (PF) 100 MCG/2ML IJ SOLN
INTRAMUSCULAR | Status: DC | PRN
  Administered 2020-03-28 (×2): 50 ug via INTRAVENOUS

## 2020-03-28 MED ORDER — METHYLPREDNISOLONE SODIUM SUCC 125 MG IJ SOLR
100.0000 mg | Freq: Every day | INTRAMUSCULAR | Status: DC
  Administered 2020-03-28 – 2020-03-31 (×4): 100 mg via INTRAVENOUS
  Filled 2020-03-28 (×4): qty 2

## 2020-03-28 MED ORDER — BUPIVACAINE-EPINEPHRINE 0.25% -1:200000 IJ SOLN
INTRAMUSCULAR | Status: DC | PRN
  Administered 2020-03-28: 30 mL

## 2020-03-28 MED ORDER — CHLORHEXIDINE GLUCONATE 0.12 % MT SOLN
15.0000 mL | Freq: Once | OROMUCOSAL | Status: AC
  Administered 2020-03-28: 15 mL via OROMUCOSAL

## 2020-03-28 MED ORDER — ONDANSETRON HCL 4 MG/2ML IJ SOLN
INTRAMUSCULAR | Status: DC | PRN
  Administered 2020-03-28: 4 mg via INTRAVENOUS

## 2020-03-28 MED ORDER — SCOPOLAMINE 1 MG/3DAYS TD PT72: 1.0000 | MEDICATED_PATCH | TRANSDERMAL | Status: DC

## 2020-03-28 MED ORDER — MIDAZOLAM HCL 5 MG/5ML IJ SOLN
INTRAMUSCULAR | Status: DC | PRN
  Administered 2020-03-28: 2 mg via INTRAVENOUS

## 2020-03-28 SURGICAL SUPPLY — 35 items
BENZOIN TINCTURE PRP APPL 2/3 (GAUZE/BANDAGES/DRESSINGS) ×3 IMPLANT
BLADE HEX COATED 2.75 (ELECTRODE) ×3 IMPLANT
BLADE SURG 15 STRL LF DISP TIS (BLADE) ×2 IMPLANT
BLADE SURG 15 STRL SS (BLADE) ×4
CLOSURE WOUND 1/2 X4 (GAUZE/BANDAGES/DRESSINGS) ×1
COVER SURGICAL LIGHT HANDLE (MISCELLANEOUS) ×3 IMPLANT
COVER WAND RF STERILE (DRAPES) IMPLANT
DECANTER SPIKE VIAL GLASS SM (MISCELLANEOUS) ×3 IMPLANT
DERMABOND ADVANCED (GAUZE/BANDAGES/DRESSINGS) ×2
DERMABOND ADVANCED .7 DNX12 (GAUZE/BANDAGES/DRESSINGS) ×1 IMPLANT
DRAPE LAPAROTOMY TRNSV 102X78 (DRAPES) ×3 IMPLANT
ELECT REM PT RETURN 15FT ADLT (MISCELLANEOUS) ×3 IMPLANT
GAUZE 4X4 16PLY RFD (DISPOSABLE) ×3 IMPLANT
GAUZE SPONGE 4X4 12PLY STRL (GAUZE/BANDAGES/DRESSINGS) ×3 IMPLANT
GLOVE BIOGEL M 8.0 STRL (GLOVE) ×3 IMPLANT
GLOVE BIOGEL PI IND STRL 7.0 (GLOVE) ×1 IMPLANT
GLOVE BIOGEL PI INDICATOR 7.0 (GLOVE) ×2
GOWN SPEC L4 XLG W/TWL (GOWN DISPOSABLE) ×3 IMPLANT
GOWN STRL REUS W/TWL LRG LVL3 (GOWN DISPOSABLE) ×3 IMPLANT
GOWN STRL REUS W/TWL XL LVL3 (GOWN DISPOSABLE) ×9 IMPLANT
KIT BASIN OR (CUSTOM PROCEDURE TRAY) ×3 IMPLANT
KIT TURNOVER KIT A (KITS) IMPLANT
MARKER SKIN DUAL TIP RULER LAB (MISCELLANEOUS) ×3 IMPLANT
NEEDLE HYPO 22GX1.5 SAFETY (NEEDLE) IMPLANT
NEEDLE HYPO 25X1 1.5 SAFETY (NEEDLE) ×3 IMPLANT
NS IRRIG 1000ML POUR BTL (IV SOLUTION) ×3 IMPLANT
PACK BASIC VI WITH GOWN DISP (CUSTOM PROCEDURE TRAY) ×3 IMPLANT
PENCIL SMOKE EVACUATOR (MISCELLANEOUS) IMPLANT
STRIP CLOSURE SKIN 1/2X4 (GAUZE/BANDAGES/DRESSINGS) ×2 IMPLANT
SUT MNCRL AB 4-0 PS2 18 (SUTURE) ×3 IMPLANT
SUT VIC AB 2-0 SH 18 (SUTURE) ×3 IMPLANT
SUT VIC AB 4-0 SH 18 (SUTURE) ×3 IMPLANT
SYR BULB IRRIG 60ML STRL (SYRINGE) ×3 IMPLANT
SYR CONTROL 10ML LL (SYRINGE) ×3 IMPLANT
TOWEL OR NON WOVEN STRL DISP B (DISPOSABLE) ×3 IMPLANT

## 2020-03-28 NOTE — Transfer of Care (Signed)
Immediate Anesthesia Transfer of Care Note  Patient: Kaitlin Holloway  Procedure(s) Performed: LEFT THIGH MUSCLE BIOPSY (Left )  Patient Location: PACU  Anesthesia Type:General  Level of Consciousness: awake, alert  and oriented  Airway & Oxygen Therapy: Patient Spontanous Breathing and Patient connected to face mask oxygen  Post-op Assessment: Report given to RN and Post -op Vital signs reviewed and stable  Post vital signs: Reviewed and stable  Last Vitals:  Vitals Value Taken Time  BP 142/89 03/28/20 1041  Temp    Pulse 79 03/28/20 1042  Resp 17 03/28/20 1042  SpO2 100 % 03/28/20 1042  Vitals shown include unvalidated device data.  Last Pain:  Vitals:   03/28/20 0915  TempSrc: Oral  PainSc:       Patients Stated Pain Goal: 4 (03/28/20 0914)  Complications: No complications documented.

## 2020-03-28 NOTE — Anesthesia Procedure Notes (Signed)
Procedure Name: LMA Insertion Performed by: Armando Lauman J, CRNA Pre-anesthesia Checklist: Patient identified, Emergency Drugs available, Suction available, Patient being monitored and Timeout performed Patient Re-evaluated:Patient Re-evaluated prior to induction Oxygen Delivery Method: Circle system utilized Preoxygenation: Pre-oxygenation with 100% oxygen Induction Type: IV induction Ventilation: Mask ventilation without difficulty LMA: LMA inserted LMA Size: 4.0 Number of attempts: 1 Placement Confirmation: positive ETCO2 and breath sounds checked- equal and bilateral Tube secured with: Tape Dental Injury: Teeth and Oropharynx as per pre-operative assessment        

## 2020-03-28 NOTE — Progress Notes (Signed)
Spoke with Debbe Bales, RN from [address], surgery report received. Surgery scheduled for 1000, informed floor nurse we will send for patient probably by [phone] today.

## 2020-03-28 NOTE — Progress Notes (Signed)
Progress Note: General Surgery Service   Chief Complaint/Subjective: Anticipating surgery today  Objective: Vital signs in last 24 hours: Temp:  [98.2 F (36.8 C)-99.3 F (37.4 C)] 98.2 F (36.8 C) (12/06 0453) Pulse Rate:  [80-100] 89 (12/06 0453) Resp:  [17-18] 18 (12/06 0453) BP: (118-134)/(70-85) 118/73 (12/06 0453) SpO2:  [96 %-100 %] 96 % (12/06 0453) Last BM Date: 03/28/20  Intake/Output from previous day: 12/05 0701 - 12/06 0700 In: 1323 [P.O.:1320; I.V.:3] Out: -  Intake/Output this shift: No intake/output data recorded.  Gen: No acute distress.  Pleasant  Resp: bilateral breath sounds, no increased work of breathing  Card: regular rate and rhythm  Abd: soft, non-tender, non-distended  Lab Results: CBC  Recent Labs    03/27/20 0635 03/28/20 0603  WBC 18.6* 13.2*  HGB 13.0 13.0  HCT 37.1 37.8  PLT 246 247   BMET Recent Labs    03/28/20 0603  NA 135  K 3.7  CL 103  CO2 22  GLUCOSE 289*  BUN 22*  CREATININE 0.81  CALCIUM 8.7*   PT/INR No results for input(s): LABPROT, INR in the last 72 hours. ABG No results for input(s): PHART, HCO3 in the last 72 hours.  Invalid input(s): PCO2, PO2  Anti-infectives: Anti-infectives (From admission, onward)   Start     Dose/Rate Route Frequency Ordered Stop   03/28/20 0915  [MAR Hold]  ceFAZolin (ANCEF) IVPB 2g/100 mL premix        (MAR Hold since Mon 03/28/2020 at 0856.Hold Reason: Transfer to a Procedural area.)   2 g 200 mL/hr over 30 Minutes Intravenous On call to O.R. 03/27/20 0827 03/29/20 0559   03/25/20 0600  ciprofloxacin (CIPRO) IVPB 400 mg        400 mg 200 mL/hr over 60 Minutes Intravenous On call to O.R. 03/24/20 1509 03/26/20 0559   03/16/20 0415  doxycycline (VIBRAMYCIN) 100 mg in sodium chloride 0.9 % 250 mL IVPB        100 mg 125 mL/hr over 120 Minutes Intravenous  Once 03/16/20 0407 03/16/20 0637      Medications: Scheduled Meds: . [MAR Hold] enoxaparin (LOVENOX) injection  40 mg  Subcutaneous Q24H  . [MAR Hold] glipiZIDE  10 mg Oral QAC breakfast  . [MAR Hold] insulin aspart  0-9 Units Subcutaneous TID WC  . [MAR Hold] insulin aspart  4 Units Subcutaneous TID WC  . [MAR Hold] insulin glargine  15 Units Subcutaneous QHS  . [MAR Hold] losartan  25 mg Oral Daily  . [MAR Hold] melatonin  3 mg Oral QHS  . [MAR Hold] metoprolol tartrate  12.5 mg Oral BID  . [MAR Hold] nystatin   Topical BID  . [MAR Hold] senna-docusate  1 tablet Oral BID  . [MAR Hold] sodium chloride flush  3 mL Intravenous Q12H   Continuous Infusions: . [MAR Hold] sodium chloride    . [MAR Hold]  ceFAZolin (ANCEF) IV     PRN Meds:.[MAR Hold] sodium chloride, [MAR Hold] acetaminophen **OR** [MAR Hold] acetaminophen, [MAR Hold] HYDROcodone-acetaminophen, [MAR Hold]  HYDROmorphone (DILAUDID) injection, [MAR Hold] sodium chloride flush  Assessment/Plan: Myositis Plan for LEFT THIGH MUSCLE BIOPSY 03/28/2020 Risks, benefits and alternatives discussed this AM.  Proceed to OR today.     LOS: 11 days   Quentin Ore, MD [phone] [facility]

## 2020-03-28 NOTE — Anesthesia Postprocedure Evaluation (Signed)
Anesthesia Post Note  Patient: Kaitlin Holloway  Procedure(s) Performed: LEFT THIGH MUSCLE BIOPSY (Left )     Patient location during evaluation: PACU Anesthesia Type: General Level of consciousness: awake and alert Pain management: pain level controlled Vital Signs Assessment: post-procedure vital signs reviewed and stable Respiratory status: spontaneous breathing, nonlabored ventilation, respiratory function stable and patient connected to nasal cannula oxygen Cardiovascular status: blood pressure returned to baseline and stable Postop Assessment: no apparent nausea or vomiting Anesthetic complications: no   No complications documented.  Last Vitals:  Vitals:   03/28/20 1045 03/28/20 1100  BP: 138/83 125/71  Pulse: 78 74  Resp: 16 16  Temp:    SpO2: 100% 100%    Last Pain:  Vitals:   03/28/20 1100  TempSrc:   PainSc: (P) 0-No pain                 Trevor Iha

## 2020-03-28 NOTE — Progress Notes (Signed)
Kaitlin Holloway is post to have a muscle biopsy today.  She had a decent weekend.  Her blood sugars are quite high.  This morning her blood sugar is 289.  She still has eosinophilia.  The real problem that we have is that I cannot say that she has a primary hypereosinophilic syndrome.  By criteria, she has had a persistent elevated eosinophil count for 6 months.  She really needs to be treated for this rheumatologic condition.  Then we can tell if the eosinophils respond.  She still has a lot of joint issues.  There is no fever.  She has had no nausea or vomiting.  She is eating pretty well.  She has had no diarrhea.  Again, we just have to await the results of the muscle biopsy.  Hopefully, there will be a skin biopsy to look at this rash to see if it is kind of vasculitis.  Christin Bach, MD  2 Chronicles 7:14

## 2020-03-28 NOTE — Plan of Care (Signed)
  Problem: Activity: Goal: Risk for activity intolerance will decrease Outcome: Progressing Note: Patient is still able to tolerate being able to walk under own strength.    Problem: Pain Managment: Goal: General experience of comfort will improve Outcome: Progressing Note: Patient's pain level is appropriately managed with Dilaudid and Norco.    Problem: Safety: Goal: Ability to remain free from injury will improve Outcome: Progressing Note: Patient knows own ability and strength.    Problem: Skin Integrity: Goal: Risk for impaired skin integrity will decrease Outcome: Progressing Note: Patient's skin rash has been stable with Nystatin powder.

## 2020-03-28 NOTE — Progress Notes (Signed)
PROGRESS NOTE    Kaitlin Holloway  NFA:[id number] DOB: [birth year] DOA: 03/15/2020 PCP: Kaitlin Hook, MD   Chief Complaint  Patient presents with  . Joint Pain    Brief Narrative: 49 year old female with past medical history of T2DM, has not been taking her medication, comes to the ED for evaluation of shortness of breath/ joint pain. As per report [name] reported she thinks she contracted Covid March 2020,she  Reported she was in perfect health condition doing regular exercise a few times a week prior to that.However after March 2020 she started to have diffuse joint pain muscle aches,weakness, she has been taking ibuprofen 1-3 times per day for more than a year now, she noticed she is getting slower, and weaker she cannot make a fist due to stiffness and weakness, she will report proximal muscle weakness is harder for her to raise arms , harder for her to get from sitting position to stand up position.She presented to the hospital due to significant upper back pain to the point she cannot breath. Patient had managed her diabetes with yoga and dietary changes due to mistrust of the medical system. 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 Seen by neurology, hematology and undergoing rheumatologic work-up:CRP 3.5>2.7, ESR 28>35. LDH 385> 360. rpr neg, uric acid 4.1 nl, cytology no eosinophilia,Anti Jo av Ig < 0.2, CCP IGA/IGA:176, RF > 650, ANA NEG, aldolase +20.4, SPEPE :SPE pattern reflects hypoalbuminemia. Evidence of monoclonal  protein is not  apparent.Patient underwent bone marrow biopsy after being seen by hematology due to persistent leukocytosis and eosinophilia-report is not conclusive as per hematology and not sure if it is primary I;e hypereosinophilic syndrome or reactive due to collagen vascular disease. S/P muscle biopsy left thigh 03/27/20.  Subjective: Patient back from muscle biopsy today.  Complains of ongoing proximal muscle weakness along with joint pain   Assessment & Plan:  Generalized body weight with polyarthralgia, bilateral ankle swelling, stiffness in the hands and proximal muscle weakness, small joint tenderness and tenderness on upper thoracic spine: Patient had extensive work-up  with CT spine, MRI spine, see report for details.  No PE on CT, no DVT on duplex, bilateral ankle x-ray with degenerative changes, lab with mild rhabdomyolysis. 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 MRI lumbar spine-disc and facet degeneration.labs-mildly elevated ESR and CRP, ANA negative, anti-Jo 1 negative CRP  3.5>2.7,ESR mildly elevated  28>35. LDH 385> 360. rpr neg, uric acid 4.1 nl, cytology no eosinophilia,CCP IGA/IGA:176, RF > 650,CK 1219> 773>400, aldolase 20.4.Rheumatology at [facility] were contacted, no bed for transfer, seen by hematology and neurology here underwent bone marrow biopsy 11/29. normocellular for age with prominent eosinophilic proliferation.Not conclusive as per hematology and not sure if it is primary i;e hypereosinophilic syndrome or reactive due to collagen vascular disease.Hematology in favor of empiric steroid. On discussion with rheumatology at [facility] muscle biopsy then trail of steroid 1 mg/kg, suspecting overlap rheumatoid arthritis with myositis. S/P muscle biopsy. We will do steroid trial and monitor sugar closely.  Sent ambulatory referral to rheumatology clinic Dr. Alfonso Ramus.  Patient has known dysphagia diplopia respiratory muscle weakness.  I  discussed with [facility] Dr. Benjamine Mola over the phone, we went over the labs and clinical picture: Plan is to start solumedrol here. If Muslce Biopsy pathology okay can lower steroid  quickly. If positive can continue 1 mg/kg for few days and cont at 40 mg/day on d/c. Trend CK and Trend sugar while here.  Deconditioning from #1: PT OT eval.  Dyspnea on admission:CTA chest no acute finding.Echocardiogram with a small pericardial effusion.  Patient attributes that she was having so much pain that it was hard to breathe on admission.  Especially with pain in the back in between the scapula   SVT ? nonsustained initially:Doing well on Lopressor.  No recurrence.TSH stable.    Non-insulin dependent diabetes mellitus, not on medication at home.  Started on glipizide 10 mg-increasing to twice daily, continue Lantus and increase to 20 units tonight cont premeal and SSI - WILL need to monitor closely after starting steroid. Patient may need to go home on insulin if sugar remains poorly controlled especially on steroids, this was discussed with she seems to be more agreeable with the plan.She has not been taking medication given some mistrust in her medical system. Recent Labs  Lab 03/27/20 1138 03/27/20 1612 03/27/20 2053 03/28/20 0725 03/28/20 1139  GLUCAP 126* 226* 224* 220* 142*   Recent history of cataract surgery on right eye.no issues.  Leukocytosis persistent with Eosinophil 13.0, afebrile, underwent BM biopsy"Normocellular bone marrow for age with prominent eosinophilic Proliferation".procal was 0.1 normal on 03/16/20 she has been afebrile.  WBC seems better today 13.2K Recent Labs  Lab 03/23/20 0624 03/24/20 0627 03/25/20 0612 03/27/20 0635 03/28/20 0603  WBC 19.3* 18.2* 19.2* 18.6* 13.2*   Hypertension: BP stable on losartan and metoprolol ( both new).  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   Patient  made a comment to Dr Maxie Better we didn't figure it out soon she wanted to just die" PSYCH was consulted and she denied suicidal ideation and has been cleared.  Nutrition: Diet Order            Diet regular Room service appropriate? Yes; Fluid consistency: Thin  Diet effective now                 Body mass index is 28.25 kg/m.  DVT prophylaxis: enoxaparin (LOVENOX) injection 40 mg Start: 03/25/20 1000 Place and maintain sequential compression device Start: 03/17/20 1714 Code Status:  Code Status: Full Code  Family Communication:Plan of care discussed with patient at bedside.  Status is: Inpatient Remains inpatient appropriate because:Ongoing diagnostic testing needed not appropriate for outpatient work up and Inpatient level of care appropriate due to severity of illness  Dispo: The patient is from: Home              Anticipated d/c is to: Home.              Anticipated d/c date is: 2 days              Patient currently is not medically stable to d/c.  Consultants:see note  Procedures:see note  Culture/Microbiology    Component Value Date/Time   SDES  03/19/2020 1647    BLOOD LEFT ANTECUBITAL Performed at [facility], [place] [address] [place], [place] [address]    SPECREQUEST  03/19/2020 1647    BOTTLES DRAWN AEROBIC ONLY Blood Culture adequate volume Performed at [facility]  [address] [place], [place] [address]    CULT  03/19/2020 1647    NO GROWTH 5 DAYS Performed at [facility], [place] [address], [place], [place] [address]    REPTSTATUS 03/24/2020 FINAL 03/19/2020 1647    Other culture-see note  Medications: Scheduled Meds: . enoxaparin (LOVENOX) injection  40 mg Subcutaneous Q24H  . glipiZIDE  10 mg Oral QAC breakfast  . insulin aspart  0-9 Units Subcutaneous TID WC  . insulin aspart  4 Units Subcutaneous TID WC  . insulin glargine  15 Units Subcutaneous QHS  . losartan  25 mg Oral Daily  . melatonin  3 mg  Oral QHS  . metoprolol tartrate  12.5 mg Oral BID  . nystatin   Topical BID  . senna-docusate  1 tablet Oral BID  . sodium chloride flush  3 mL Intravenous Q12H   Continuous Infusions: . sodium chloride      Antimicrobials: Anti-infectives (From admission, onward)   Start     Dose/Rate Route Frequency Ordered Stop   03/28/20 0915  ceFAZolin (ANCEF) IVPB 2g/100 mL premix        2 g 200 mL/hr over 30 Minutes Intravenous On call to O.R. 03/27/20 0827 03/28/20 1019   03/25/20 0600  ciprofloxacin (CIPRO) IVPB 400 mg        400 mg 200 mL/hr over 60 Minutes Intravenous On call to O.R. 03/24/20 1509 03/26/20 0559   03/16/20 0415  doxycycline (VIBRAMYCIN) 100 mg in sodium chloride 0.9 % 250 mL IVPB        100 mg 125 mL/hr over 120 Minutes Intravenous  Once 03/16/20 0407 03/16/20 0637    Objective: Vitals: Today's Vitals   03/28/20 1100 03/28/20 1115 03/28/20 1237 03/28/20 1308  BP: 125/71 126/82  126/82  Pulse: 74 72  72  Resp: '16 16  16  ' Temp:  98 F (36.7 C)  97.9 F (36.6 C)  TempSrc:    Oral  SpO2: 100% 100%  100%  Weight:      Height:      PainSc: 0-No pain 0-No pain 6      Intake/Output Summary (Last 24 hours) at 03/28/2020 1435 Last data filed at 03/28/2020 1042 Gross per 24 hour  Intake 2040 ml  Output 5 ml  Net 2035 ml   Filed Weights   03/16/20 0539 03/28/20 0924  Weight: 99.8 kg 99.8 kg   Weight change:   Intake/Output from previous day: 12/05 0701 - 12/06 0700 In: 2703 [P.O.:1320; I.V.:3] Out: -  Intake/Output this shift: Total I/O In: 1200 [I.V.:1100; IV Piggyback:100] Out: 5 [Blood:5]  Examination: General exam: AAOx3 , NAD, weak appearing. HEENT:Oral mucosa moist, Ear/Nose WNL grossly, dentition normal. Respiratory system: bilaterally clear,no wheezing or crackles,no use of accessory muscle Cardiovascular system: S1 & S2 +, No JVD,. Gastrointestinal system: Abdomen soft, NT,ND, BS+ Nervous System:Alert, awake, moving extremities and grossly  nonfocal Extremities: No edema,distal peripheral pulses palpable. tender proximal metacarpophalangeal joints with mild swelling. Skin: No rashes,no icterus. MSK: Normal muscle bulk,tone, power   Data Reviewed: I have personally reviewed following labs and imaging studies CBC: Recent Labs  Lab 03/23/20 0624 03/24/20 0627 03/25/20 0612 03/27/20 0635 03/28/20 0603  WBC 19.3* 18.2* 19.2* 18.6* 13.2*  NEUTROABS  --  3.5 3.5  --  3.9  HGB 14.1 13.7 13.6 13.0 13.0  HCT 39.7 40.1 39.5 37.1 37.8  MCV 87.4 88.9 87.0 87.7 87.9  PLT 232 231 238 246 500   Basic Metabolic Panel: Recent Labs  Lab 03/22/20 0635 03/23/20 0624 03/24/20 0627 03/25/20 0612 03/28/20 0603  NA 134* 135 136 134* 135  K 4.4 4.3 4.2 4.3 3.7  CL 101 102 105 103 103  CO2 '24 25 23 24 22  ' GLUCOSE 220* 262* 194* 147* 289*  BUN 19 25* 22* 15 22*  CREATININE 0.82 0.81 0.76 0.69 0.81  CALCIUM 8.9 8.9 8.9 8.8* 8.7*   GFR: Estimated Creatinine Clearance: 113.5 mL/min (by C-G formula based on SCr of 0.81 mg/dL). Liver Function Tests: Recent Labs  Lab 03/22/20 0635 03/23/20 0624 03/24/20 0627 03/25/20 0612 03/28/20 0603  AST '17 17 17 ' 12* 14*  ALT '19 18 17 16 13  ' ALKPHOS 71 84 73 65 88  BILITOT 0.9 0.7 0.6 0.7 0.4  PROT 6.4* 6.5 6.1* 6.0* 6.5  ALBUMIN 3.1* 3.2* 3.1* 2.9* 3.0*   No results for input(s): LIPASE, AMYLASE in the last 168 hours. No results for input(s): AMMONIA in the last 168 hours. Coagulation Profile: No results for input(s): INR, PROTIME in the last 168 hours. Cardiac Enzymes: No results for input(s): CKTOTAL, CKMB, CKMBINDEX, TROPONINI in the last 168 hours. BNP (last 3 results) No results for input(s): PROBNP in the last 8760 hours. HbA1C: No results for input(s): HGBA1C in the last 72 hours. CBG: Recent Labs  Lab 03/27/20 1138 03/27/20 1612 03/27/20 2053 03/28/20 0725 03/28/20 1139  GLUCAP 126* 226* 224* 220* 142*   Lipid Profile: No results for input(s): CHOL, HDL, LDLCALC,  TRIG, CHOLHDL, LDLDIRECT in the last 72 hours. Thyroid Function Tests: No results for input(s): TSH, T4TOTAL, FREET4, T3FREE, THYROIDAB in the last 72 hours. Anemia Panel: No results for input(s): VITAMINB12, FOLATE, FERRITIN, TIBC, IRON, RETICCTPCT in the last 72 hours. Sepsis Labs: No results for input(s): PROCALCITON, LATICACIDVEN in the last 168 hours.  Recent Results (from the past 240 hour(s))  Culture, blood (routine x 2)     Status: None   Collection Time: 03/19/20  4:39 PM   Specimen: BLOOD  Result Value Ref Range Status   Specimen Description   Final    BLOOD LEFT ANTECUBITAL Performed at [facility] [address], [place], [place] [address]    Special Requests   Final    BOTTLES DRAWN AEROBIC ONLY Blood Culture adequate volume Performed at [facility] [address] [place], [place] [address]    Culture   Final    NO GROWTH 5 DAYS Performed at [facility], [place] [address] [place], [place] [address]    Report Status 03/24/2020 FINAL  Final  Culture, blood (routine x 2)     Status: None   Collection Time: 03/19/20  4:47 PM   Specimen: BLOOD  Result Value Ref Range Status   Specimen Description   Final    BLOOD LEFT ANTECUBITAL Performed at [facility] [address] [place], [place] [address]    Special Requests   Final    BOTTLES DRAWN AEROBIC ONLY Blood Culture adequate volume Performed at [facility] [address], [place], [place] [address]    Culture   Final    NO GROWTH 5 DAYS Performed at [facility], [place] [address], [place],  [address]    Report Status 03/24/2020 FINAL  Final     Radiology Studies: No results found.   LOS: 11 days   Antonieta Pert, MD [facility]  03/28/2020, 2:35 PM

## 2020-03-28 NOTE — Op Note (Signed)
   Patient: Kaitlin Holloway ([birth year], [id number])  Date of Surgery: 03/15/2020 - 03/28/2020   Preoperative Diagnosis: MYOSITIS   Postoperative Diagnosis: MYOSITIS   Surgical Procedure: LEFT THIGH MUSCLE BIOPSY:    Operative Team Members:  Surgeon(s) and Role:    * Brittany Osier, Hyman Hopes, MD - Primary   Anesthesiologist: Trevor Iha, MD CRNA: Kizzie Fantasia, CRNA; Orest Dikes, CRNA   Anesthesia: General   Fluids:  Total I/O In: 1000 [I.V.:1000] Out: 5 [Blood:5]  Complications:none Drains:  none   Specimen:  ID Type Source Tests Collected by Time Destination  1 : Left thigh muscle biopsy Tissue PATH Other SURGICAL PATHOLOGY Shannelle Alguire, Hyman Hopes, MD 03/28/2020 1005      Disposition:  PACU - hemodynamically stable.  Plan of Care: Continued care on med/surg floor    Indications for Procedure: [name] is a 49 y.o. female who presented with myositis.  Muscle biopsy was recommended to assist in diagnosis.  The risks, benefits and alternatives were discussed with the patient who granted consent to proceed.    Findings: Left thigh muscle biopsy sent  Description of Procedure: On the date stated above patient taken operating room suite placed supine position.  Antibiotics were given prior to the case start.  SCDs were placed on the lower extremities prior to the case start.  General LMA anesthesia was induced.  The patient's left thigh was prepped and draped in usual sterile fashion.  A timeout was completed verifying the correct patient, procedure, position, and equipment needed for the case.  We began by anesthetizing the skin over the left quadricep muscle.  A incision was made along the longitudinal axis of the leg.  We dissected down to the fascia of the quadricep muscle.  The fascia was opened.  Approximately 2 to 3 cm of muscle fibers just below the fascia were isolated from the other muscle fibers using hemostats.  The muscle fibers were tied off with vicryl  suture and then divided.  The muscle biopsy fibers were sent off to pathology.  The fascia was closed using running vicryl suture.  The subcutaneous tissues were closed with vicryl suture.  The skin was closed using 4-0 monocryl and dermabond.  All sponge and needle counts were correct at the end of this case.     Ivar Drape, MD General, Bariatric, & Minimally Invasive Surgery [facility], [place]

## 2020-03-29 ENCOUNTER — Ambulatory Visit: Admit: 2020-03-29 | Admitting: Ophthalmology

## 2020-03-29 ENCOUNTER — Encounter (HOSPITAL_COMMUNITY): Payer: Self-pay | Admitting: Hematology & Oncology

## 2020-03-29 ENCOUNTER — Encounter (HOSPITAL_COMMUNITY): Payer: Self-pay | Admitting: Surgery

## 2020-03-29 LAB — LACTATE DEHYDROGENASE: LDH: 296 U/L — ABNORMAL HIGH (ref 98–192)

## 2020-03-29 LAB — GLUCOSE, CAPILLARY
Glucose-Capillary: 257 mg/dL — ABNORMAL HIGH (ref 70–99)
Glucose-Capillary: 177 mg/dL — ABNORMAL HIGH (ref 70–99)
Glucose-Capillary: 291 mg/dL — ABNORMAL HIGH (ref 70–99)

## 2020-03-29 LAB — BASIC METABOLIC PANEL
Anion gap: 8 (ref 5–15)
BUN: 26 mg/dL — ABNORMAL HIGH (ref 6–20)
CO2: 25 mmol/L (ref 22–32)
Calcium: 8.9 mg/dL (ref 8.9–10.3)
Chloride: 100 mmol/L (ref 98–111)
Creatinine, Ser: 0.81 mg/dL (ref 0.44–1.00)
GFR, Estimated: >60 mL/min (ref >60)
Glucose, Bld: 247 mg/dL — ABNORMAL HIGH (ref 70–99)
Potassium: 4.2 mmol/L (ref 3.5–5.1)
Sodium: 133 mmol/L — ABNORMAL LOW (ref 135–145)

## 2020-03-29 LAB — CK: Total CK: 155 U/L (ref 38–234)

## 2020-03-29 SURGERY — PHACOEMULSIFICATION, CATARACT, WITH IOL INSERTION
Anesthesia: Topical | Laterality: Left

## 2020-03-29 MED ORDER — INSULIN ASPART PROT & ASPART (70-30 MIX) 100 UNIT/ML ~~LOC~~ SUSP
10.0000 [IU] | Freq: Two times a day (BID) | SUBCUTANEOUS | Status: DC
  Administered 2020-03-29 – 2020-03-31 (×4): 10 [IU] via SUBCUTANEOUS
  Filled 2020-03-29: qty 10

## 2020-03-29 NOTE — Progress Notes (Signed)
PROGRESS NOTE    Kaitlin Holloway  ONG:[id number] DOB: 1970-12-23 DOA: 03/15/2020 PCP: Mack Hook, MD   Chief Complaint  Patient presents with  . Joint Pain    Brief Narrative: 49 year old female with past medical history of T2DM, has not been taking her medication, comes to the ED for evaluation of shortness of breath/ joint pain. As per report Ms [name] reported she thinks she contracted Covid March 2020,she  Reported she was in perfect health condition doing regular exercise a few times a week prior to that.However after March 2020 she started to have diffuse joint pain muscle aches,weakness, she has been taking ibuprofen 1-3 times per day for more than a year now, she noticed she is getting slower, and weaker she cannot make a fist due to stiffness and weakness, she will report proximal muscle weakness is harder for her to raise arms , harder for her to get from sitting position to stand up position.She presented to the hospital due to significant upper back pain to the point she cannot breath. Patient had managed her diabetes with yoga and dietary changes due to mistrust of the medical system. 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 Seen by neurology, hematology and undergoing rheumatologic work-up:CRP 3.5>2.7, ESR 28>35. LDH 385> 360. rpr neg, uric acid 4.1 nl, cytology no eosinophilia,Anti Jo av Ig < 0.2, CCP IGA/IGA:176, RF > 650, ANA NEG, aldolase +20.4, SPEPE :SPE pattern reflects hypoalbuminemia. Evidence of monoclonal  protein is not  apparent.Patient underwent bone marrow biopsy after being seen by hematology due to persistent leukocytosis and eosinophilia-report is not conclusive as per hematology and not sure if it is primary I;e hypereosinophilic syndrome or reactive due to collagen vascular disease. S/P muscle biopsy left thigh 03/28/20. Started on steroid  Subjective:  Reports he feels much better today able to raise her arm without weakness, ambulatory,no more proximal muscle weakness.  She feels 95% better.  Blood sugar has been running high. Biopsy pending  Assessment & Plan:  Generalized body weight with polyarthralgia, bilateral ankle swelling, stiffness in the hands and proximal muscle weakness, small joint tenderness and tenderness on upper thoracic spine: Patient had extensive work-up  with CT spine, MRI spine, see report for details.  No PE on CT, no DVT on duplex, bilateral ankle x-ray with degenerative changes, lab with mild rhabdomyolysis. 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 MRI lumbar spine-disc and facet degeneration.labs-mildly elevated ESR and CRP, ANA negative, anti-Jo 1 negative CRP  3.5>2.7,ESR mildly elevated  28>35. LDH 385> 360. rpr neg, uric acid 4.1 nl, cytology no eosinophilia,CCP IGA/IGA:176, RF > 650,CK 1219> 773>400, aldolase 20.4.Rheumatology at [facility] were contacted, no bed for transfer, seen by hematology and neurology here underwent bone marrow biopsy 11/29. normocellular for age with prominent eosinophilic proliferation.Not conclusive as per hematology and not sure if it is primary i;e hypereosinophilic syndrome or reactive due to collagen vascular disease.Hematology in favor of empiric steroid. On discussion with rheumatology at [facility] muscle biopsy then trail of steroid 1 mg/kg, suspecting overlap rheumatoid arthritis with myositis. S/P muscle biopsy 12/6- Discussed w/ [facility] Dr. Benjamine Mola over the  phone, we went over the labs  and clinical picture: start ed solumedrol 1 mg/kg- with significant improvement in patient's weakness and pain, pending muscle biopsy reprot- If Biopsy pathology okay can lower steroid  Quickly and d/c home-If positive can continue 1 mg/kg for few days and cont at 40 mg/day on d/c. Please discuss w/ Dr Benjamine Mola over secure chat/or call office. Ck stable. Will switch her to mix NovoLog 70/30 to transition to home insulin and monitor cbg  Deconditioning from #1: PT OT eval.  Dyspnea on admission:CTA chest no acute finding.Echocardiogram with a small pericardial effusion.  Patient attributes that she was having so much pain that it was hard to breathe on admission.  Currently improved.  SVT ? nonsustained initially:Doing well on Lopressor.  No recurrence.TSH stable.    Non-insulin dependent diabetes mellitus, not on medication at home.  Started on glipizide 10 mg, continue twice daily, getting lantus and premeal ( even before solumedrol here) with solumedrol sugar increasing- switching to NovoLog 70/30 insulin from Lantus and premeal insulin due to cost coverage issues.  Monitor next 24 hours. Patient will likely need to go home on insulin especially given steroids, this was discussed with her she seems to be more agreeable with the plan.She has not been taking medication given some mistrust in her medical system. Recent Labs  Lab 03/28/20 1139 03/28/20 1629 03/28/20 2124 03/29/20 0223 03/29/20 1151  GLUCAP 142* 320* 264* 257* 177*   Recent history of cataract surgery on right eye.no issues.  Leukocytosis persistent with Eosinophil 13.0, afebrile, underwent BM biopsy"Normocellular bone marrow for age with prominent eosinophilic Proliferation".procal was 0.1 normal on 03/16/20 she has been afebrile.  Her 13.2K, repeat in a.m. Recent Labs  Lab 03/23/20 0624 03/24/20 0627 03/25/20 0612 03/27/20 0635 03/28/20 0603  WBC 19.3* 18.2* 19.2* 18.6* 13.2*   Hypertension: BP stable on losartan and  metoprolol ( both new).  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   Patient made a comment to Dr Maxie Better we didn't figure it out soon she wanted to just die" PSYCH was consulted and she denied suicidal ideation and has been cleared.  Nutrition: Diet Order            Diet Carb Modified Fluid consistency: Thin; Room service appropriate? Yes  Diet effective now                 Body mass index is 28.25 kg/m.  DVT prophylaxis: enoxaparin (LOVENOX) injection 40 mg Start: 03/25/20 1000 Place and maintain sequential compression device Start: 03/17/20 1714 Code Status:  Code Status: Full Code  Family Communication:Plan of care discussed with patient at bedside.  Status is: Inpatient Remains inpatient appropriate because: Pending muscle biopsy report and blood sugar stabilization on steroid  Dispo: The patient is from: Home              Anticipated d/c is to: Home.              Anticipated d/c date is: 1-2 days              Patient currently is not medically stable to d/c.  Consultants:see note  Procedures:see note  Culture/Microbiology    Component Value Date/Time   SDES  03/19/2020 1647    BLOOD LEFT ANTECUBITAL Performed at [facility], [place] [address], [place], [place] [address]    [place]  03/19/2020 [place]  ONLY Blood Culture adequate volume Performed at [facility] [address], [place], [place] [address]    CULT  03/19/2020 1647    NO GROWTH 5 DAYS Performed at [facility] [address] [place], [place] [address]    REPTSTATUS 03/24/2020 FINAL 03/19/2020 1647    Other culture-see note  Medications: Scheduled Meds: . enoxaparin (LOVENOX) injection  40 mg Subcutaneous Q24H  . glipiZIDE  10 mg Oral BID AC  . insulin aspart  0-9 Units Subcutaneous TID WC  . insulin aspart protamine- aspart  10 Units Subcutaneous  BID WC  . losartan  25 mg Oral Daily  . melatonin  3 mg Oral QHS  . methylPREDNISolone (SOLU-MEDROL) injection  100 mg Intravenous Daily  . metoprolol tartrate  12.5 mg Oral BID  . nystatin   Topical BID  . senna-docusate  1 tablet Oral BID  . sodium chloride flush  3 mL Intravenous Q12H   Continuous Infusions: . sodium chloride      Antimicrobials: Anti-infectives (From admission, onward)   Start     Dose/Rate Route Frequency Ordered Stop   03/28/20 0915  ceFAZolin (ANCEF) IVPB 2g/100 mL premix        2 g 200 mL/hr over 30 Minutes Intravenous On call to O.R. 03/27/20 0827 03/28/20 1019   03/25/20 0600  ciprofloxacin (CIPRO) IVPB 400 mg        400 mg 200 mL/hr over 60 Minutes Intravenous On call to O.R. 03/24/20 1509 03/26/20 0559   03/16/20 0415  doxycycline (VIBRAMYCIN) 100 mg in sodium chloride 0.9 % 250 mL IVPB        100 mg 125 mL/hr over 120 Minutes Intravenous  Once 03/16/20 0407 03/16/20 0637    Objective: Vitals: Today's Vitals   03/29/20 0553 03/29/20 0837 03/29/20 0939 03/29/20 0950  BP: 130/76   117/67  Pulse: 73   75  Resp: 16     Temp: (!) 97.5 F (36.4 C)     TempSrc: Oral     SpO2: 99%     Weight:      Height:      PainSc:  6  2      Intake/Output Summary (Last 24 hours) at 03/29/2020 1311 Last data filed at 03/29/2020 0900 Gross per 24 hour  Intake 240 ml  Output --  Net 240 ml   Filed Weights   03/16/20 0539 03/28/20 0924  Weight: 99.8 kg 99.8 kg   Weight change:   Intake/Output from previous day: 12/06 0701 - 12/07 0700 In: 1200 [I.V.:1100; IV Piggyback:100] Out: 5 [Blood:5] Intake/Output this shift: Total I/O In: 240 [P.O.:240] Out: -   Examination: General exam: AAOx3 , NAD, weak appearing. HEENT:Oral mucosa moist, Ear/Nose WNL grossly, dentition normal. Respiratory system: bilaterally clear,no wheezing or crackles,no use of accessory muscle Cardiovascular system: S1 & S2 +, No JVD,. Gastrointestinal system: Abdomen soft, NT,ND,  BS+ Nervous System:Alert, awake, moving extremities and grossly nonfocal Extremities: No edema, distal peripheral pulses palpable.  Skin: No rashes,no icterus. MSK: Normal muscle bulk,tone, power Left thigh biopsy area intact w/ glue  Data Reviewed: I have personally reviewed following labs and imaging studies CBC: Recent Labs  Lab 03/23/20 0624 03/24/20 0627 03/25/20 0612 03/27/20 0635 03/28/20 0603  WBC 19.3* 18.2* 19.2* 18.6* 13.2*  NEUTROABS  --  3.5 3.5  --  3.9  HGB 14.1 13.7 13.6 13.0 13.0  HCT 39.7 40.1 39.5 37.1 37.8  MCV 87.4 88.9 87.0 87.7 87.9  PLT  232 231 238 246 195   Basic Metabolic Panel: Recent Labs  Lab 03/23/20 0624 03/24/20 0627 03/25/20 0612 03/28/20 0603 03/29/20 0606  NA 135 136 134* 135 133*  K 4.3 4.2 4.3 3.7 4.2  CL 102 105 103 103 100  CO2 _0 GLUCOSE 262* 194* 147* 289* 247*  BUN 25* 22* 15 22* 26*  CREATININE 0.81 0.76 0.69 0.81 0.81  CALCIUM 8.9 8.9 8.8* 8.7* 8.9   GFR: Estimated Creatinine Clearance: 113.5 mL/min (by C-G formula based on SCr of 0.81 mg/dL). Liver Function Tests: Recent Labs  Lab 03/23/20 0624 03/24/20 0627 03/25/20 0612 03/28/20 0603  AST 17 17 12* 14*  ALT _1 ALKPHOS 84 73 65 88  BILITOT 0.7 0.6 0.7 0.4  PROT 6.5 6.1* 6.0* 6.5  ALBUMIN 3.2* 3.1* 2.9* 3.0*   No results for input(s): LIPASE, AMYLASE in the last 168 hours. No results for input(s): AMMONIA in the last 168 hours. Coagulation Profile: No results for input(s): INR, PROTIME in the last 168 hours. Cardiac Enzymes: Recent Labs  Lab 03/28/20 2242 03/29/20 0606  CKTOTAL 194 155   BNP (last 3 results) No results for input(s): PROBNP in the last 8760 hours. HbA1C: No results for input(s): HGBA1C in the last 72 hours. CBG: Recent Labs  Lab 03/28/20 1139 03/28/20 1629 03/28/20 2124 03/29/20 0223 03/29/20 1151  GLUCAP 142* 320* 264* 257* 177*   Lipid Profile: No results for input(s): CHOL, HDL, LDLCALC, TRIG,  CHOLHDL, LDLDIRECT in the last 72 hours. Thyroid Function Tests: No results for input(s): TSH, T4TOTAL, FREET4, T3FREE, THYROIDAB in the last 72 hours. Anemia Panel: No results for input(s): VITAMINB12, FOLATE, FERRITIN, TIBC, IRON, RETICCTPCT in the last 72 hours. Sepsis Labs: No results for input(s): PROCALCITON, LATICACIDVEN in the last 168 hours.  Recent Results (from the past 240 hour(s))  Culture, blood (routine x 2)     Status: None   Collection Time: 03/19/20  4:39 PM   Specimen: BLOOD  Result Value Ref Range Status   Specimen Description   Final    BLOOD LEFT ANTECUBITAL Performed at [facility] [address] [place], [place] [address]    Special Requests   Final    BOTTLES DRAWN AEROBIC ONLY Blood Culture adequate volume Performed at [facility] [address] [place], [place] [address]    Culture   Final    NO GROWTH 5 DAYS Performed at [facility], [place] [address], [place], [place] [address]    Report Status 03/24/2020 FINAL  Final  Culture, blood (routine x 2)     Status: None   Collection Time: 03/19/20  4:47 PM   Specimen: BLOOD  Result Value Ref Range Status   Specimen Description   Final    BLOOD LEFT ANTECUBITAL Performed at [facility] [address], [place], [place] [address]    Special Requests   Final    BOTTLES DRAWN AEROBIC ONLY Blood Culture adequate volume Performed at [facility] [address] [place], [place] [address]    Culture   Final    NO GROWTH 5 DAYS Performed at [facility], [place] [address] [place], [place] [address]    Report Status 03/24/2020 FINAL  Final     Radiology Studies: No results found.   LOS: 12 days   Antonieta Pert, MD [facility]  03/29/2020, 1:11 PM

## 2020-03-29 NOTE — Progress Notes (Signed)
Progress Note: General Surgery Service   Chief Complaint/Subjective: No acute events overnight  Objective: Vital signs in last 24 hours: Temp:  [97.5 F (36.4 C)-98.4 F (36.9 C)] 97.5 F (36.4 C) (12/07 0553) Pulse Rate:  [72-83] 75 (12/07 0950) Resp:  [16-17] 16 (12/07 0553) BP: (117-142)/(67-89) 117/67 (12/07 0950) SpO2:  [98 %-100 %] 99 % (12/07 0553) Last BM Date: 03/28/20  Intake/Output from previous day: 12/06 0701 - 12/07 0700 In: 1200 [I.V.:1100; IV Piggyback:100] Out: 5 [Blood:5] Intake/Output this shift: No intake/output data recorded.  Gen: No acute distress.  Pleasant  Resp: bilateral breath sounds, no increased work of breathing  Card: regular rate and rhythm  Abd: soft, non-tender, non-distended  Left thigh: incision c/d/i w/ glue  Lab Results: CBC  Recent Labs    03/27/20 0635 03/28/20 0603  WBC 18.6* 13.2*  HGB 13.0 13.0  HCT 37.1 37.8  PLT 246 247   BMET Recent Labs    03/28/20 0603  NA 135  K 3.7  CL 103  CO2 22  GLUCOSE 289*  BUN 22*  CREATININE 0.81  CALCIUM 8.7*   PT/INR No results for input(s): LABPROT, INR in the last 72 hours. ABG No results for input(s): PHART, HCO3 in the last 72 hours.  Invalid input(s): PCO2, PO2  Anti-infectives: Anti-infectives (From admission, onward)   Start     Dose/Rate Route Frequency Ordered Stop   03/28/20 0915  ceFAZolin (ANCEF) IVPB 2g/100 mL premix        2 g 200 mL/hr over 30 Minutes Intravenous On call to O.R. 03/27/20 0827 03/28/20 1019   03/25/20 0600  ciprofloxacin (CIPRO) IVPB 400 mg        400 mg 200 mL/hr over 60 Minutes Intravenous On call to O.R. 03/24/20 1509 03/26/20 0559   03/16/20 0415  doxycycline (VIBRAMYCIN) 100 mg in sodium chloride 0.9 % 250 mL IVPB        100 mg 125 mL/hr over 120 Minutes Intravenous  Once 03/16/20 0407 03/16/20 0637      Medications: Scheduled Meds: . enoxaparin (LOVENOX) injection  40 mg Subcutaneous Q24H  . glipiZIDE  10 mg Oral BID AC   . insulin aspart  0-9 Units Subcutaneous TID WC  . insulin aspart  4 Units Subcutaneous TID WC  . insulin glargine  20 Units Subcutaneous QHS  . losartan  25 mg Oral Daily  . melatonin  3 mg Oral QHS  . methylPREDNISolone (SOLU-MEDROL) injection  100 mg Intravenous Daily  . metoprolol tartrate  12.5 mg Oral BID  . nystatin   Topical BID  . senna-docusate  1 tablet Oral BID  . sodium chloride flush  3 mL Intravenous Q12H   Continuous Infusions: . sodium chloride     PRN Meds:.sodium chloride, acetaminophen **OR** acetaminophen, HYDROcodone-acetaminophen, HYDROmorphone (DILAUDID) injection, sodium chloride flush  Assessment/Plan: Myositis s/p left thigh muscle biopsy Incision c/d/i w/ glue Surgery team will sign off, follow up with our office for wound check.  Please call with questions.      LOS: 12 days   Quentin Ore, MD [phone] [facility]

## 2020-03-30 DIAGNOSIS — M609 Myositis, unspecified: Secondary | ICD-10-CM

## 2020-03-30 LAB — BASIC METABOLIC PANEL
Anion gap: 8 (ref 5–15)
BUN: 28 mg/dL — ABNORMAL HIGH (ref 6–20)
CO2: 26 mmol/L (ref 22–32)
Calcium: 8.6 mg/dL — ABNORMAL LOW (ref 8.9–10.3)
Chloride: 104 mmol/L (ref 98–111)
Creatinine, Ser: 0.64 mg/dL (ref 0.44–1.00)
GFR, Estimated: >60 mL/min (ref >60)
Glucose, Bld: 161 mg/dL — ABNORMAL HIGH (ref 70–99)
Potassium: 3.6 mmol/L (ref 3.5–5.1)
Sodium: 138 mmol/L (ref 135–145)

## 2020-03-30 LAB — CBC WITH DIFFERENTIAL/PLATELET
Abs Immature Granulocytes: 0.02 10*3/uL (ref 0.00–0.07)
Basophils Absolute: 0.1 10*3/uL (ref 0.0–0.1)
Basophils Relative: 1 %
Eosinophils Absolute: 1.5 10*3/uL — ABNORMAL HIGH (ref 0.0–0.5)
Eosinophils Relative: 16 %
HCT: 36.8 % (ref 36.0–46.0)
Hemoglobin: 12.9 g/dL (ref 12.0–15.0)
Immature Granulocytes: 0 %
Lymphocytes Relative: 22 %
Lymphs Abs: 2.2 10*3/uL (ref 0.7–4.0)
MCH: 30.5 pg (ref 26.0–34.0)
MCHC: 35.1 g/dL (ref 30.0–36.0)
MCV: 87 fL (ref 80.0–100.0)
Monocytes Absolute: 0.6 10*3/uL (ref 0.1–1.0)
Monocytes Relative: 6 %
Neutro Abs: 5.4 10*3/uL (ref 1.7–7.7)
Neutrophils Relative %: 55 %
Platelets: 267 10*3/uL (ref 150–400)
RBC: 4.23 MIL/uL (ref 3.87–5.11)
RDW: 13.4 % (ref 11.5–15.5)
WBC: 9.7 10*3/uL (ref 4.0–10.5)
nRBC: 0 % (ref 0.0–0.2)

## 2020-03-30 LAB — GLUCOSE, CAPILLARY
Glucose-Capillary: 142 mg/dL — ABNORMAL HIGH (ref 70–99)
Glucose-Capillary: 100 mg/dL — ABNORMAL HIGH (ref 70–99)
Glucose-Capillary: 395 mg/dL — ABNORMAL HIGH (ref 70–99)
Glucose-Capillary: 330 mg/dL — ABNORMAL HIGH (ref 70–99)

## 2020-03-30 LAB — CK: Total CK: 84 U/L (ref 38–234)

## 2020-03-30 LAB — MAGNESIUM: Magnesium: 2 mg/dL (ref 1.7–2.4)

## 2020-03-30 LAB — LACTATE DEHYDROGENASE: LDH: 244 U/L — ABNORMAL HIGH (ref 98–192)

## 2020-03-30 NOTE — Progress Notes (Signed)
Kaitlin Holloway had her muscle biopsy yesterday.  She did well with this.  There is no results back yet.  It would not surprise me if the pathologist says this for a second opinion.  A 24-hour urine that was done did not show any monoclonal protein in her urine.  Her kappa light chain and lambda light chain were both elevated.  I would think this is probably more reactive.  She says her joints feel a lot better since the steroids have been started.  Again this is suggestive of a collagen vascular issue.  She really needs to see rheumatology when she is discharged.  Steroids on a long-term basis for her probably would not be all that good given her diabetes.  We really need to see what her CBC shows and see what the eosinophils look like.  She is eating well.  She has had no nausea or vomiting.  There is no dyspepsia with the steroids.  Her vital signs all look stable.  Her temperature is 97.4.  Pulse 67.  Blood pressure 112/66.  Her lungs are clear bilaterally.  Oral exam does not show any thrush.  Her cardiac exam is regular rate and rhythm.  She has no murmurs.  Abdomen is soft.  I really cannot palpate her liver or spleen.  Extremities shows some fungus in the toenails and her big toe bilaterally.  Her joints do not look swollen or red.  She has good range of motion of her joints.  Again, have to suspect that we are looking at a collagen vascular issue as a source for the eosinophilia.  We will see what her eosinophils are with steroids.  Ultimately, she is going to need some type of treatment for the rheumatoid arthritis.  Whether this be methotrexate or a monoclonal antibody would be the likely possibilities.  I appreciate the outstanding care that she is getting from all the staff on 6 E.   Christin Bach, MD  Duwayne Heck 6:9

## 2020-03-30 NOTE — Hospital Course (Addendum)
49 year old female with past medical history of T2DM, has not been taking her medication, comes to the ED for evaluation of shortness of breath/ joint pain. As per report Kaitlin Holloway reported she thinks she contracted Covid March 2020, she  Reported she was in perfect health condition doing regular exercise a few times a week prior to that.  However after March 2020 she started to have diffuse joint pain muscle aches, weakness, she has been taking ibuprofen 1-3 times per day for more than a year now, she noticed she is getting slower, and weaker she cannot make a fist due to stiffness and weakness, she will report proximal muscle weakness is harder for her to raise arms , harder for her to get from sitting position to stand up position.She presented to the hospital due to significant upper back pain to the point she cannot breath. Patient had managed her diabetes with yoga and dietary changes due to mistrust of the medical system. 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 Seen by neurology, hematology and undergoing rheumatologic work-up:CRP 3.5>2.7, ESR 28>35. LDH 385> 360. rpr neg, uric acid 4.1 nl, cytology no eosinophilia,Anti Jo av Ig < 0.2, CCP IGA/IGA:176, RF > 650, ANA NEG, aldolase +20.4, SPEPE :SPE pattern reflects hypoalbuminemia. Evidence of monoclonal  protein is not apparent.Patient underwent bone marrow biopsy after being seen by hematology due to persistent leukocytosis and eosinophilia-report is not conclusive as per hematology and not sure if it is  primary IgE hypereosinophilic syndrome (less likely per oncology) or reactive due to collagen vascular disease. S/P muscle biopsy left thigh 03/28/20. Started on steroid.  She continued to clinically improve after initiation of steroids.  CK down trended, WBC normalized, eosinophilia down trended.  Muscle biopsy was pending at time of discharge.  She plans to follow-up with rheumatology outpatient. Case was discussed prior to discharge with Dr. Benjamine Mola.  She is being discharged with prednisone 40 mg daily.  I instructed her that she needs to follow-up with rheumatology within 2 weeks of discharge and if unable, needs to contact hospital for assistance with arranging rheumatology follow-up.  She voiced understanding to this. Diabetes medications were prescribed given expected hyperglycemia in setting of steroid use.

## 2020-03-30 NOTE — Progress Notes (Signed)
PROGRESS NOTE    Kaitlin Holloway   BSW:[id number]  DOB: 1970/10/24  DOA: 03/15/2020     49  PCP: Mack Hook, MD  CC: pains all over, SOB  Hospital Course: 49 year old female with past medical history of T2DM, has not been taking her medication, comes to the ED for evaluation of shortness of breath/ joint pain. As per report Kaitlin Holloway reported she thinks she contracted Covid March 2020, she  Reported she was in perfect health condition doing regular exercise a few times a week prior to that.  However after March 2020 she started to have diffuse joint pain muscle aches, weakness, she has been taking ibuprofen 1-3 times per day for more than a year now, she noticed she is getting slower, and weaker she cannot make a fist due to stiffness and weakness, she will report proximal muscle weakness is harder for her to raise arms , harder for her to get from sitting position to stand up position.She presented to the hospital due to significant upper back pain to the point she cannot breath. Patient had managed her diabetes with yoga and dietary changes due to mistrust of the medical system. 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 Seen by neurology, hematology and undergoing rheumatologic work-up:CRP 3.5>2.7, ESR 28>35. LDH 385> 360. rpr neg, uric acid 4.1 nl, cytology no eosinophilia,Anti Jo av Ig < 0.2, CCP IGA/IGA:176, RF > 650, ANA NEG, aldolase +20.4, SPEPE :SPE pattern reflects hypoalbuminemia. Evidence of monoclonal  protein is not apparent.Patient  underwent bone marrow biopsy after being seen by hematology due to persistent leukocytosis and eosinophilia-report is not conclusive as per hematology and not sure if it is primary IgE hypereosinophilic syndrome or reactive due to collagen vascular disease. S/P muscle biopsy left thigh 03/28/20. Started on steroid.   Interval History:  No events overnight. Introduced myself to the patient this morning and we reviewed her history and the multiple events of her hospitalization. Mostly discussed that now we are waiting on muscle biopsy results if they come back in a timely fashion, then will discuss with rheumatology the next steps.  She does seem to state that her diffuse pains have been gradually improving after having started on steroids.  Old records reviewed in assessment of this patient  ROS: Constitutional: negative for chills and fevers, Respiratory: negative for cough, Cardiovascular: negative for chest pain and Gastrointestinal: negative for abdominal pain  Assessment & Plan:  Generalized body weight with polyarthralgia, bilateral ankle swelling, stiffness in the hands and proximal muscle weakness, small joint tenderness and tenderness on upper thoracic spine: Patient had extensive work-up  with CT spine, MRI spine, see report for details.  No PE on CT, no DVT on duplex, bilateral ankle x-ray with degenerative changes, lab with mild rhabdomyolysis. 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 MRI lumbar spine-disc and facet degeneration.labs-mildly elevated ESR and CRP, ANA negative, anti-Jo 1 negative CRP  3.5>2.7,ESR mildly elevated  28>35. LDH 385> 360. rpr neg, uric acid 4.1 nl, cytology no eosinophilia,CCP IGA/IGA:176, RF > 650,CK 1219> 773>400, aldolase 20.4.Rheumatology at [facility] were contacted, no bed for transfer, seen  by hematology and neurology here underwent bone marrow biopsy 11/29. normocellular for age with prominent eosinophilic  proliferation.Not conclusive as per hematology and not sure if it is primary i;e hypereosinophilic syndrome or reactive due to collagen vascular disease.Hematology in favor of empiric steroid. On discussion with rheumatology at [facility] muscle biopsy then trail of steroid 1 mg/kg, suspecting overlap rheumatoid arthritis with myositis. S/P muscle biopsy 12/6- Discussed w/ [facility] Dr. Benjamine Mola over the phone, we went over the labs and clinical picture: start ed solumedrol 1 mg/kg- with significant improvement in patient's weakness and pain, pending muscle biopsy reprot- If Biopsy pathology okay can lower steroid  Quickly and d/c home-If positive can continue 1 mg/kg for few days and cont at 40 mg/day on d/c. Please discuss w/ Dr Benjamine Mola over secure chat/or call office. Ck stable. Will switch her to mix NovoLog 70/30 to transition to home insulin and monitor cbg  Deconditioning from #1: PT OT eval.  Dyspnea on admission:CTA chest no acute finding.Echocardiogram with a small pericardial effusion.  Patient attributes that she was having so much pain that it was hard to breathe on admission.  Currently improved.  SVT ? nonsustained initially:Doing well on Lopressor.  No recurrence.TSH stable.    Non-insulin dependent diabetes mellitus, not on medication at home.  Started on glipizide 10 mg, continue twice daily, getting lantus and premeal ( even before solumedrol here) with solumedrol sugar increasing- switching to NovoLog 70/30 insulin from Lantus and premeal insulin due to cost coverage issues.  Monitor next 24 hours. Patient will likely need to go home on insulin especially given steroids, this was discussed with her she seems to be more agreeable with the plan.She has not been taking medication given some mistrust in her medical system.  Recent history of cataract surgery on right eye.no issues.  Leukocytosis persistent with Eosinophil 13.0, afebrile, underwent BM biopsy"Normocellular bone  marrow for age with prominent eosinophilic Proliferation".procal was 0.1 normal on 03/16/20 she has been afebrile.  Her 13.2K, repeat in a.m.  Hypertension: BP stable on losartan and metoprolol ( both new).  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   Patient made a comment to Dr Maxie Better we didn't figure it out soon she wanted to just die" PSYCH was consulted and she denied suicidal ideation and has been cleared.  Antimicrobials: none  DVT prophylaxis: Lovenox Code Status: Full Family Communication: none present Disposition Plan: Status is: Inpatient  Remains inpatient appropriate because:Ongoing diagnostic testing needed not appropriate for outpatient work up, IV treatments appropriate due to intensity of illness or inability to take PO and Inpatient level of care appropriate due to severity of illness   Dispo: The patient is from: Home              Anticipated d/c is to: Home              Anticipated d/c date is: 3 days              Patient currently is not medically stable to d/c.  Objective: Blood pressure 112/74, pulse 74, temperature 97.8 F (36.6 C), temperature source Oral, resp. rate (!) 21, height _0  (1.88 m), weight 99.8 kg, SpO2 96 %.  Examination: General appearance: alert, cooperative and no distress Head: Normocephalic, without obvious abnormality, atraumatic  Mouth: small aphthous ulcer on center roof of mouth Eyes: EOMI. Small edematous area noted in lateral periorbital area of skin Lungs: clear  to auscultation bilaterally Heart: regular rate and rhythm and S1, S2 normal Abdomen: normal findings: bowel sounds normal and soft, non-tender Extremities: no edema Skin: mobility and turgor normal  Joints: no obvious TTP in shoulders, elbows, wrists, fingers, knees, or ankles. No swelling or erythema noted in joints assessed Neurologic: Grossly normal  Consultants:    Hematology  Procedures:   Muscle biopsy  Data Reviewed: I have personally reviewed following labs and imaging studies Results for orders placed or performed during the hospital encounter of 03/15/20 (from the past 24 hour(s))  Glucose, capillary     Status: Abnormal   Collection Time: 03/29/20  8:44 PM  Result Value Ref Range   Glucose-Capillary 291 (H) 70 - 99 mg/dL  Lactate dehydrogenase     Status: Abnormal   Collection Time: 03/30/20  6:50 AM  Result Value Ref Range   LDH 244 (H) 98 - 192 U/L  CK     Status: None   Collection Time: 03/30/20  6:50 AM  Result Value Ref Range   Total CK 84 38.0 - 234.0 U/L  Basic metabolic panel     Status: Abnormal   Collection Time: 03/30/20  6:50 AM  Result Value Ref Range   Sodium 138 135 - 145 mmol/L   Potassium 3.6 3.5 - 5.1 mmol/L   Chloride 104 98 - 111 mmol/L   CO2 26 22 - 32 mmol/L   Glucose, Bld 161 (H) 70 - 99 mg/dL   BUN 28 (H) 6 - 20 mg/dL   Creatinine, Ser 0.64 0.44 - 1.00 mg/dL   Calcium 8.6 (L) 8.9 - 10.3 mg/dL   GFR, Estimated >60 >60 mL/min   Anion gap 8 5 - 15  Magnesium     Status: None   Collection Time: 03/30/20  6:50 AM  Result Value Ref Range   Magnesium 2.0 1.7 - 2.4 mg/dL  Glucose, capillary     Status: Abnormal   Collection Time: 03/30/20  7:31 AM  Result Value Ref Range   Glucose-Capillary 142 (H) 70 - 99 mg/dL  CBC with Differential/Platelet     Status: Abnormal   Collection Time: 03/30/20  9:02 AM  Result Value Ref Range   WBC 9.7 4.0 - 10.5 K/uL   RBC 4.23 3.87 - 5.11 MIL/uL   Hemoglobin 12.9 12.0 - 15.0 g/dL   HCT 36.8 36 - 46 %   MCV 87.0 80.0 - 100.0 fL   MCH 30.5 26.0 - 34.0 pg   MCHC 35.1 30.0 - 36.0 g/dL   RDW 13.4 11.5 - 15.5 %   Platelets 267 150 - 400 K/uL   nRBC 0.0 0.0 - 0.2 %   Neutrophils Relative % 55 %   Neutro Abs 5.4 1.7 - 7.7 K/uL   Lymphocytes Relative 22 %   Lymphs Abs 2.2 0.7 - 4.0 K/uL   Monocytes Relative 6 %   Monocytes Absolute 0.6 0.1 - 1.0 K/uL   Eosinophils  Relative 16 %   Eosinophils Absolute 1.5 (H) 0.0 - 0.5 K/uL   Basophils Relative 1 %   Basophils Absolute 0.1 0.0 - 0.1 K/uL   Immature Granulocytes 0 %   Abs Immature Granulocytes 0.02 0.00 - 0.07 K/uL  Glucose, capillary     Status: Abnormal   Collection Time: 03/30/20 12:21 PM  Result Value Ref Range   Glucose-Capillary 100 (H) 70 - 99 mg/dL    No results found for this or any previous visit (from the past 240 hour(s)).   Radiology  Studies: No results found. CT BONE MARROW BIOPSY & ASPIRATION  Final Result    CT BIOPSY  Final Result    MR Lumbar Spine W Wo Contrast  Final Result    CT ABDOMEN PELVIS W CONTRAST  Final Result    DG Hand 2 View Right  Final Result    DG Foot 2 Views Left  Final Result    DG Foot 2 Views Right  Final Result    DG Hand 2 View Left  Final Result    MR THORACIC SPINE WO CONTRAST  Final Result    VAS [place] LOWER EXTREMITY VENOUS (DVT)  Final Result    DG Ankle 2 Views Left  Final Result    DG Ankle 2 Views Right  Final Result    CT Angio Chest PE W and/or Wo Contrast  Final Result    DG Chest Portable 1 View  Final Result      Scheduled Meds: . enoxaparin (LOVENOX) injection  40 mg Subcutaneous Q24H  . glipiZIDE  10 mg Oral BID AC  . insulin aspart  0-9 Units Subcutaneous TID WC  . insulin aspart protamine- aspart  10 Units Subcutaneous BID WC  . losartan  25 mg Oral Daily  . melatonin  3 mg Oral QHS  . methylPREDNISolone (SOLU-MEDROL) injection  100 mg Intravenous Daily  . metoprolol tartrate  12.5 mg Oral BID  . nystatin   Topical BID  . senna-docusate  1 tablet Oral BID  . sodium chloride flush  3 mL Intravenous Q12H   PRN Meds: sodium chloride, acetaminophen **OR** acetaminophen, HYDROcodone-acetaminophen, HYDROmorphone (DILAUDID) injection, sodium chloride flush Continuous Infusions: . sodium chloride       LOS: 13 days  Time spent: Greater than 50% of the 35 minute visit was spent in counseling/coordination  of care for the patient as laid out in the A&P.   Dwyane Dee, MD [facility] 03/30/2020, 4:06 PM

## 2020-03-31 ENCOUNTER — Encounter: Payer: Self-pay | Admitting: Internal Medicine

## 2020-03-31 DIAGNOSIS — Z794 Long term (current) use of insulin: Secondary | ICD-10-CM

## 2020-03-31 DIAGNOSIS — I10 Essential (primary) hypertension: Secondary | ICD-10-CM

## 2020-03-31 LAB — CBC WITH DIFFERENTIAL/PLATELET
Abs Immature Granulocytes: 0.03 10*3/uL (ref 0.00–0.07)
Basophils Absolute: 0.1 10*3/uL (ref 0.0–0.1)
Basophils Relative: 1 %
Eosinophils Absolute: 0.3 10*3/uL (ref 0.0–0.5)
Eosinophils Relative: 4 %
HCT: 34.4 % — ABNORMAL LOW (ref 36.0–46.0)
Hemoglobin: 11.8 g/dL — ABNORMAL LOW (ref 12.0–15.0)
Immature Granulocytes: 0 %
Lymphocytes Relative: 24 %
Lymphs Abs: 1.9 10*3/uL (ref 0.7–4.0)
MCH: 29.9 pg (ref 26.0–34.0)
MCHC: 34.3 g/dL (ref 30.0–36.0)
MCV: 87.1 fL (ref 80.0–100.0)
Monocytes Absolute: 0.8 10*3/uL (ref 0.1–1.0)
Monocytes Relative: 10 %
Neutro Abs: 5.1 10*3/uL (ref 1.7–7.7)
Neutrophils Relative %: 61 %
Platelets: 247 10*3/uL (ref 150–400)
RBC: 3.95 MIL/uL (ref 3.87–5.11)
RDW: 13.2 % (ref 11.5–15.5)
WBC: 8.2 10*3/uL (ref 4.0–10.5)
nRBC: 0 % (ref 0.0–0.2)

## 2020-03-31 LAB — COMPREHENSIVE METABOLIC PANEL
ALT: 13 U/L (ref 0–44)
AST: 11 U/L — ABNORMAL LOW (ref 15–41)
Albumin: 3.1 g/dL — ABNORMAL LOW (ref 3.5–5.0)
Alkaline Phosphatase: 72 U/L (ref 38–126)
Anion gap: 5 (ref 5–15)
BUN: 28 mg/dL — ABNORMAL HIGH (ref 6–20)
CO2: 27 mmol/L (ref 22–32)
Calcium: 8.7 mg/dL — ABNORMAL LOW (ref 8.9–10.3)
Chloride: 105 mmol/L (ref 98–111)
Creatinine, Ser: 0.6 mg/dL (ref 0.44–1.00)
GFR, Estimated: >60 mL/min (ref >60)
Glucose, Bld: 117 mg/dL — ABNORMAL HIGH (ref 70–99)
Potassium: 3.9 mmol/L (ref 3.5–5.1)
Sodium: 137 mmol/L (ref 135–145)
Total Bilirubin: 0.4 mg/dL (ref 0.3–1.2)
Total Protein: 6.4 g/dL — ABNORMAL LOW (ref 6.5–8.1)

## 2020-03-31 LAB — MAGNESIUM: Magnesium: 2 mg/dL (ref 1.7–2.4)

## 2020-03-31 LAB — LACTATE DEHYDROGENASE: LDH: 239 U/L — ABNORMAL HIGH (ref 98–192)

## 2020-03-31 LAB — GLUCOSE, CAPILLARY
Glucose-Capillary: 106 mg/dL — ABNORMAL HIGH (ref 70–99)
Glucose-Capillary: 95 mg/dL (ref 70–99)

## 2020-03-31 MED ORDER — INSULIN REGULAR HUMAN 100 UNIT/ML IJ SOLN
1.0000 [IU] | Freq: Three times a day (TID) | INTRAMUSCULAR | 11 refills | Status: DC
Start: 2020-03-31 — End: 2021-08-15

## 2020-03-31 MED ORDER — NOVOLIN 70/30 FLEXPEN RELION (70-30) 100 UNIT/ML ~~LOC~~ SUPN: 10.0000 [IU] | PEN_INJECTOR | Freq: Two times a day (BID) | SUBCUTANEOUS | 11 refills | Status: DC

## 2020-03-31 MED ORDER — INSULIN PEN NEEDLE 32G X 4 MM MISC: 1.0000 | Freq: Three times a day (TID) | 3 refills | Status: DC

## 2020-03-31 MED ORDER — LOSARTAN POTASSIUM 25 MG PO TABS
25.0000 mg | ORAL_TABLET | Freq: Every day | ORAL | 3 refills | Status: DC
Start: 2020-04-01 — End: 2021-08-15

## 2020-03-31 MED ORDER — GLIPIZIDE 10 MG PO TABS
10.0000 mg | ORAL_TABLET | Freq: Two times a day (BID) | ORAL | 3 refills | Status: DC
Start: 2020-03-31 — End: 2021-08-15

## 2020-03-31 MED ORDER — METOPROLOL TARTRATE 25 MG PO TABS
12.5000 mg | ORAL_TABLET | Freq: Two times a day (BID) | ORAL | 3 refills | Status: DC
Start: 2020-03-31 — End: 2021-08-15

## 2020-03-31 MED ORDER — PREDNISONE 20 MG PO TABS: 40.0000 mg | ORAL_TABLET | Freq: Every day | ORAL | 1 refills | Status: DC

## 2020-03-31 NOTE — TOC Transition Note (Signed)
Transition of Care [facility]) - CM/SW Discharge Note   Patient Details  Name: Kaitlin Holloway MRN: [id number] Date of Birth: 1971/04/06  Transition of Care [facility]) CM/SW Contact:  Bartholome Bill, RN Phone Number: 03/31/2020, 3:41 PM   Clinical Narrative:     Pt provided with MATCH letter for dc medications. Hospital follow up appointment was also scheduled at the [facility] and placed on AVS.    Barriers to Discharge: No Barriers Identified      Social Determinants of Health (SDOH) Interventions     Readmission Risk Interventions No flowsheet data found.

## 2020-03-31 NOTE — Progress Notes (Signed)
Physical Therapy Treatment Patient Details Name: Kaitlin Holloway MRN: [id number] DOB: 27-Nov-1970 Today's Date: 03/31/2020    History of Present Illness Pt is 49 y.o. female with PMH of COVID 69, DM, HTN, and chronic polyarthralgia presents to the emergency department with continued joint pain but new onset difficulty breathing. Pt stated joint pain started after likely covid infection in March 2020.  Pt with underwent bone biopsy Monday 03/21/20.    PT Comments    Patient has progressed well with mobility during acute stay. She is mobilizing at [place] level for gait and transfers and complete stair mobility today with no overt LOB. She reports understanding of HEP and has been mobilizing regularly in hallway. She will benefit from skilled therapy in OP setting to progress strength. At this time pt has met mobility goals at safe level to discharge home and does not require skilled acute care services. Will sign off as pt is safe to mobilize at mod independent level.   Follow Up Recommendations  Outpatient PT     Equipment Recommendations  None recommended by PT    Recommendations for Other Services       Precautions / Restrictions Precautions Precautions: None Restrictions Weight Bearing Restrictions: No    Mobility  Bed Mobility Overal bed mobility: Independent             General bed mobility comments: performed from flat bed  Transfers Overall transfer level: Modified independent Equipment used: None Transfers: Sit to/from Stand Sit to Stand: Modified independent (Device/Increase time);From elevated surface         General transfer comment: pt demonstrates safe transfer from EOB  Ambulation/Gait Ambulation/Gait assistance: Modified independent (Device/Increase time) Gait Distance (Feet): 300 Feet Assistive device: None Gait Pattern/deviations: WFL(Within Functional Limits) Gait velocity: fair   General Gait Details: pt steady with gait, mild sway with  gait but no overt LOB.   Stairs Stairs: Yes Stairs assistance: Modified independent (Device/Increase time) Stair Management: One rail Right;Forwards;Step to pattern Number of Stairs: 4 General stair comments: no assist needed for stairs, pt usign Rt hand rail and step to pattern, no overt LOB   Wheelchair Mobility    Modified Rankin (Stroke Patients Only)       Balance Overall balance assessment: Mild deficits observed, not formally tested;Modified Independent Sitting-balance support: Feet supported Sitting balance-Leahy Scale: Good     Standing balance support: No upper extremity supported Standing balance-Leahy Scale: Good                              Cognition Arousal/Alertness: Awake/alert Behavior During Therapy: WFL for tasks assessed/performed Overall Cognitive Status: Within Functional Limits for tasks assessed                                        Exercises      General Comments        Pertinent Vitals/Pain Pain Assessment: No/denies pain Pain Intervention(s): Monitored during session;Repositioned    Home Living                      Prior Function            PT Goals (current goals can now be found in the care plan section) Acute Rehab PT Goals Patient Stated Goal: decrease pain, get stronger, get back to yoga and dancing PT  Goal Formulation: With patient Time For Goal Achievement: 03/31/20 Potential to Achieve Goals: Good Progress towards PT goals: Goals met/education completed, patient discharged from PT    Frequency    Min 3X/week      PT Plan Current plan remains appropriate    Co-evaluation              AM-PAC PT "6 Clicks" Mobility   Outcome Measure  Help needed turning from your back to your side while in a flat bed without using bedrails?: None Help needed moving from lying on your back to sitting on the side of a flat bed without using bedrails?: None Help needed moving to and  from a bed to a chair (including a wheelchair)?: None Help needed standing up from a chair using your arms (e.g., wheelchair or bedside chair)?: None Help needed to walk in hospital room?: None Help needed climbing 3-5 steps with a railing? : A Little 6 Click Score: 23    End of Session Equipment Utilized During Treatment: Gait belt Activity Tolerance: Patient tolerated treatment well Patient left: in bed;with call bell/phone within reach Nurse Communication: Mobility status PT Visit Diagnosis: Difficulty in walking, not elsewhere classified (R26.2);Pain     Time: 8119-1478 PT Time Calculation (min) (ACUTE ONLY): 12 min  Charges:  $Gait Training: 8-22 mins                     Verner Mould, DPT Acute Rehabilitation Services  Office [phone] Pager [phone]  03/31/2020 3:01 PM

## 2020-03-31 NOTE — Progress Notes (Signed)
I suspect you may have the answer for the eosinophilia.  Her is an affiliate has resolved with treatment of the rheumatoid arthritis.  She is on steroids.  Eosinophilic percent is down to 4%.  Yesterday the percentage was 16%.  There is no muscle biopsy result back as of yet.  Her blood sugars actually doing pretty well.  The LDH is coming down also.  Her white cell count is 8.2.  Platelet count 247,000.  I am very impressed with the response of the eosinophilia to the steroids.  Again, I highly doubt that we are looking at a primary bone marrow disorder-i.e. hypereosinophilic syndrome.  This is still possible.  Only time will really tell.  Hopefully, she will be able to go home soon.  She really needs to have rheumatology follow-up as an outpatient to get her on a chronic regimen for the rheumatoid arthritis.  I am not too worried about the 24-hour urine that she had with the elevated light chains.  Both are elevated which would indicate a reactionary excretion.  I appreciate the great care that she is getting from all staff up on 6 E.  Christin Bach, MD  Molli Hazard 2:2

## 2020-03-31 NOTE — Discharge Summary (Signed)
Physician Discharge Summary   Kaitlin Holloway WGN:[id number] DOB: 1970/07/20 DOA: 03/15/2020  PCP: Kaitlin Hook, MD  Admit date: 03/15/2020 Discharge date: 03/31/2020  Admitted From: home Disposition:  home Discharging physician: Kaitlin Dee, MD  Recommendations for Outpatient Follow-up:  1. Follow up with rheumatology within 2 weeks 2. If patient presents to ER with related rheumatology concerns, needs to be transferred to tertiary center with available rheumatology 3. Follow-up muscle biopsy 4. Prednisone will need to be tapered   Patient discharged to home in Discharge Condition: stable CODE STATUS: Full Diet recommendation:  Diet Orders (From admission, onward)    Start     Ordered   03/31/20 0000  Diet Carb Modified        03/31/20 1324   03/29/20 1008  Diet Carb Modified Fluid consistency: Thin; Room service appropriate? Yes  Diet effective now       Question Answer Comment  Diet-HS Snack? Nothing   Calorie Level Medium 1600-2000   Fluid consistency: Thin   Room service appropriate? Yes      03/29/20 1007          Hospital Course: 49 year old female with past medical history of T2DM, has not been taking her medication, comes to the ED for evaluation of shortness of breath/ joint pain. As per report Kaitlin Holloway reported she thinks she contracted Covid March 2020, she  Reported she was in perfect health condition doing regular exercise a few times a week prior to that.  However after March 2020 she started to have diffuse joint pain muscle aches, weakness, she has been taking ibuprofen 1-3 times per day for more than a year now, she noticed she is getting slower, and weaker she cannot make a fist due to stiffness and weakness, she will report proximal muscle weakness is harder for her to raise arms , harder for her to get from sitting position to stand up position.She presented to the hospital due to significant upper back pain to the point she cannot breath. Patient  had managed her diabetes with yoga and dietary changes due to mistrust of the medical system. 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 Seen by neurology, hematology and undergoing rheumatologic work-up:CRP 3.5>2.7, ESR 28>35. LDH 385> 360. rpr neg, uric acid 4.1 nl, cytology no eosinophilia,Anti Jo av Ig < 0.2, CCP IGA/IGA:176, RF > 650, ANA NEG, aldolase +20.4, SPEPE :SPE pattern reflects hypoalbuminemia. Evidence of monoclonal  protein is not apparent.Patient underwent bone marrow biopsy after being seen by hematology due to persistent leukocytosis and eosinophilia-report is not conclusive as per hematology and not sure if it is primary IgE hypereosinophilic syndrome (less likely per oncology) or reactive due to collagen vascular disease. S/P muscle biopsy left thigh 03/28/20. Started on steroid.  She continued to clinically improve after initiation of steroids.  CK down trended, WBC normalized, eosinophilia down trended.  Muscle biopsy was pending at time of discharge.  She plans to follow-up with rheumatology outpatient. Case was discussed prior to discharge with Dr. Benjamine Holloway.  She is being discharged with prednisone 40 mg daily.  I instructed her that she needs to follow-up with rheumatology  within 2 weeks of discharge and if unable, needs to contact hospital for assistance with arranging rheumatology follow-up.  She voiced understanding to this. Diabetes medications were prescribed given expected hyperglycemia in setting of steroid use.  Generalized body weight  with polyarthralgia, bilateral ankle swelling, stiffness in the hands and proximal muscle weakness, small joint tenderness and tenderness on upper thoracic spine: Patient had extensive work-up with CT spine, MRI spine, see report for details. No PE on CT, no DVT on duplex, bilateral ankle x-ray with degenerative changes, lab with mild rhabdomyolysis. 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 MRI lumbar spine-disc and facet degeneration.labs-mildly elevated ESR and CRP, ANA negative, anti-Jo 1 negative CRP 3.5>2.7,ESR mildly elevated 28>35. LDH 385>360. rpr neg, uric acid 4.1 nl, cytology no eosinophilia,CCP IGA/IGA:176, RF > 650,CK 1219> 773>400, aldolase 20.4.R[facility] at [facility] were contacted, no bed for transfer, seen by hematology and neurology here underwent bone marrow biopsy 11/29.normocellular for age with prominent eosinophilic proliferation.Not conclusive as per hematology and not sure if it is primary i;e hypereosinophilic syndrome or reactive due to collagen vascular disease.Hematology in favor of empiric steroid. On discussion with rheumatology at [facility] muscle biopsy then trail of steroid 1 mg/kg, suspecting overlap rheumatoid arthritis with myositis. S/P muscle biopsy12/6- Discussed w/[facility] Dr. Dimple Holloway over the phone, we went over the labs and clinical picture: startedsolumedrol1 mg/kg- with significantimprovement in patient's weakness and pain,pending muscle biopsy reprot - follow up biopsy at d/c; follow up with rheumatology - discussed with Dr. Dimple Holloway prior to d/c. Placed patient on prednisone 40 mg daily and informed her she needs to follow up within 2 weeks with rheumatology in [place] (and if unable to do so then needs to contact hospital for assistance)  Deconditioning from #1: PT OT eval.  Dyspnea on admission:CTA chest no acute finding.Echocardiogram with a small pericardial effusion. Patient attributes that  she was having so much pain that it was hard to breathe on admission.Currently improved. - resolved   SVT ? nonsustained initially:Doing well on Lopressor. No recurrence.TSH stable.  - discharged with lopressor prescription   Non-insulin dependent diabetes mellitus,not on medication at home.Started on glipizide 10 mg, continue twice daily,getting lantus and premeal ( even before solumedrol here) with solumedrol sugar increasing-switching to NovoLog 70/30 insulin from Lantus and premeal insulin due to cost coverage issues.Monitor next 24 hours.Patientwill likelyneed to go home on insulinespecially given steroids, this was discussed withhershe seems to be more agreeable with the plan -Discharged on NovoLog 70/30 and regular insulin for sliding scale given pricing options at [facility]  Recent history of cataract surgery on right eye.no issues.  Leukocytosis persistent with Eosinophil 13.0, afebrile, underwent BM biopsy"Normocellular bone marrow for age with prominent eosinophilic Proliferation".procal was 0.1 normal on 03/16/20 she has been afebrile.Her 13.2K, repeat in a.m. -See above work-up.  Counts improved prior to discharge  Hypertension: BP stable on losartan and metoprolol ( both new). 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  -Discharged on losartan and metoprolol  Patient made a comment to Dr Kelly Splinter we didn't figure it out soon she wanted to just die" PSYCH was consulted and she denied suicidal ideation and has been cleared.   The patient's chronic medical conditions were treated accordingly per the patient's home medication regimen except as noted.  On day of discharge, patient was felt deemed stable for discharge. Patient/family member advised to call PCP or come back to ER if needed.  Principal Diagnosis: Polyarthralgia  Discharge Diagnoses: Active Hospital Problems   Diagnosis  Date Noted  . Polyarthralgia 03/16/2020  . 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 Type 2 diabetes mellitus without complication (HCC) 03/16/2020  . Hypertension 04/23/2000    Resolved Hospital Problems   Diagnosis Date Noted Date Resolved  . Dyspnea 03/16/2020 03/31/2020  . Elevated CK 03/16/2020 03/31/2020    Discharge Instructions    Ambulatory referral to Neurology   Complete by: As directed    An appointment is requested in approximately: 2-4 weeks (EMG/NCS, c/f myositis w/ proximal muscle weakness)   Ambulatory referral to Rheumatology   Complete by: As directed    PLEASE arrange visit in 1 wk   Diet Carb Modified   Complete by: As directed    Increase activity slowly   Complete by: As directed    No wound care   Complete by: As directed      Allergies as of 03/31/2020      Reactions   Penicillins Other (See Comments)   Has patient had a PCN reaction causing immediate rash, facial/tongue/throat swelling, SOB or lightheadedness with hypotension: Yes Has patient had a PCN reaction causing severe rash involving mucus membranes or skin necrosis: No Has patient had a PCN reaction that required hospitalization: No Has patient had a PCN reaction occurring within the last 10 years: No If all of the above answers are "NO", then may proceed with Cephalosporin use.   Aspirin Other (See Comments)   Muscle spasms in back   Garlic Swelling   Morphine And Related Other (See Comments)   Headaches/ migraine   Gadavist [gadobutrol] Nausea And Vomiting   Nausea and vomiting immediately after contrast injection    Latex Rash   Gloves - when worn      Medication List    STOP taking these medications   ibuprofen 400 MG tablet Commonly known as: ADVIL   lisinopril 10 MG tablet Commonly known as: ZESTRIL   metFORMIN 500 MG 24 hr tablet Commonly known as: GLUCOPHAGE-XR     TAKE these medications   glipiZIDE 10 MG tablet Commonly known as: GLUCOTROL Take 1 tablet (10 mg total) by mouth 2  (two) times daily before a meal. What changed:   medication strength  how much to take  how to take this  when to take this  additional instructions   Insulin Pen Needle 32G X 4 MM Misc 1 each by Does not apply route 4 (four) times daily -  before meals and at bedtime.   insulin regular 100 units/mL injection Commonly known as: NOVOLIN R Inject 0.01-0.09 mLs (1-9 Units total) into the skin 4 (four) times daily -  before meals and at bedtime. Glucose 121 - 150: 1 unit, Glucose 151 - 200: 2 units, Glucose 201 - 250: 3 units, Glucose 251 - 300: 5 units, Glucose 301 - 350: 7 units, Glucose 351 - 400: 9 units, Glucose > 400, call MD   losartan 25 MG tablet Commonly known as: COZAAR Take 1 tablet (25 mg total) by mouth daily. Start taking on: April 01, 2020   metoprolol tartrate 25 MG tablet Commonly known as: LOPRESSOR Take 0.5 tablets (12.5 mg total) by mouth 2 (two) times daily.   NovoLIN 70/30 FlexPen Relion (70-30) 100 UNIT/ML KwikPen Generic drug: insulin isophane & regular human Inject 10 Units into the skin 2 (two) times daily.   predniSONE 20 MG tablet Commonly known as: DELTASONE Take 2 tablets (40 mg total) by mouth daily with  breakfast.       Follow-up Information    [facility] Follow up on 04/11/2020.   Why: Appointment at 2:10PM. To establish with Primary care provider. Contact information: [place] [address] [phone]       Surgery, [place]. Call.   Specialty: General Surgery Why: Call as needed if issues with left thigh incision.  Contact information: [address] [place] [address] [phone]              Allergies  Allergen Reactions  . Penicillins Other (See Comments)    Has patient had a PCN reaction causing immediate rash, facial/tongue/throat swelling, SOB or lightheadedness with hypotension: Yes Has patient had a PCN reaction  causing severe rash involving mucus membranes or skin necrosis: No Has patient had a PCN reaction that required hospitalization: No Has patient had a PCN reaction occurring within the last 10 years: No If all of the above answers are "NO", then may proceed with Cephalosporin use.   . Aspirin Other (See Comments)    Muscle spasms in back  . Garlic Swelling  . Morphine And Related Other (See Comments)    Headaches/ migraine  . Gadavist [Gadobutrol] Nausea And Vomiting    Nausea and vomiting immediately after contrast injection   . Latex Rash    Gloves - when worn    Consultations: Oncology Curbside rheumatology, Dr. Benjamine Holloway, greatly appreciate assistance  Discharge Exam: BP 124/82 (BP Location: Right Arm)   Pulse 67   Temp 97.9 F (36.6 C) (Oral)   Resp 16   Ht $R'6\' 2"'zV$  (1.88 m)   Wt 99.8 kg   SpO2 97%   BMI 28.25 kg/m  General appearance: alert, cooperative and no distress Head: Normocephalic, without obvious abnormality, atraumatic  Mouth: small aphthous ulcer on center roof of mouth Eyes: EOMI. Small edematous area noted in lateral periorbital area of skin Lungs: clear to auscultation bilaterally Heart: regular rate and rhythm and S1, S2 normal Abdomen: normal findings: bowel sounds normal and soft, non-tender Extremities: no edema Skin: mobility and turgor normal  Joints: no obvious TTP in shoulders, elbows, wrists, fingers, knees, or ankles. No swelling or erythema noted in joints assessed Neurologic: Grossly normal  The results of significant diagnostics from this hospitalization (including imaging, microbiology, ancillary and laboratory) are listed below for reference.   Microbiology: No results found for this or any previous visit (from the past 240 hour(s)).   Labs: BNP (last 3 results) Recent Labs    03/16/20 0004  BNP 51.1   Basic Metabolic Panel: Recent Labs  Lab 03/25/20 0612 03/28/20 0603 03/29/20 0606 03/30/20 0650 03/31/20 0550  NA 134* 135 133*  138 137  K 4.3 3.7 4.2 3.6 3.9  CL 103 103 100 104 105  CO2 $Re'24 22 25 26 27  'wef$ GLUCOSE 147* 289* 247* 161* 117*  BUN 15 22* 26* 28* 28*  CREATININE 0.69 0.81 0.81 0.64 0.60  CALCIUM 8.8* 8.7* 8.9 8.6* 8.7*  MG  --   --   --  2.0 2.0   Liver Function Tests: Recent Labs  Lab 03/25/20 0612 03/28/20 0603 03/31/20 0550  AST 12* 14* 11*  ALT $Re'16 13 13  'Qos$ ALKPHOS 65 88 72  BILITOT 0.7 0.4 0.4  PROT 6.0* 6.5 6.4*  ALBUMIN 2.9* 3.0* 3.1*   No results for input(s): LIPASE, AMYLASE in the last 168 hours. No results for input(s): AMMONIA in the last 168 hours.  CBC: Recent Labs  Lab 03/25/20 0612 03/27/20 0635 03/28/20 0603 03/30/20 0902 03/31/20 0550  WBC 19.2* 18.6* 13.2* 9.7 8.2  NEUTROABS 3.5  --  3.9 5.4 5.1  HGB 13.6 13.0 13.0 12.9 11.8*  HCT 39.5 37.1 37.8 36.8 34.4*  MCV 87.0 87.7 87.9 87.0 87.1  PLT 238 246 247 267 247   Cardiac Enzymes: Recent Labs  Lab 03/28/20 2242 03/29/20 0606 03/30/20 0650  CKTOTAL 194 155 84   BNP: Invalid input(s): POCBNP CBG: Recent Labs  Lab 03/30/20 1221 03/30/20 1716 03/30/20 2047 03/31/20 0713 03/31/20 1147  GLUCAP 100* 395* 330* 106* 95   D-Dimer No results for input(s): DDIMER in the last 72 hours. Hgb A1c No results for input(s): HGBA1C in the last 72 hours. Lipid Profile No results for input(s): CHOL, HDL, LDLCALC, TRIG, CHOLHDL, LDLDIRECT in the last 72 hours. Thyroid function studies No results for input(s): TSH, T4TOTAL, T3FREE, THYROIDAB in the last 72 hours.  Invalid input(s): FREET3 Anemia work up No results for input(s): VITAMINB12, FOLATE, FERRITIN, TIBC, IRON, RETICCTPCT in the last 72 hours. Urinalysis    Component Value Date/Time   BILIRUBINUR neg 09/08/2018 1522   PROTEINUR Negative 09/08/2018 1522   UROBILINOGEN 0.2 09/08/2018 1522   NITRITE neg 09/08/2018 1522   LEUKOCYTESUR Negative 09/08/2018 1522   Sepsis Labs Invalid input(s): PROCALCITONIN,  WBC,  LACTICIDVEN Microbiology No results found  for this or any previous visit (from the past 240 hour(s)).  Procedures/Studies: DG Ankle 2 Views Left  Result Date: 03/16/2020 CLINICAL DATA:  Bilateral ankle pain EXAM: LEFT ANKLE - 2 VIEW COMPARISON:  None. FINDINGS: There is no evidence of fracture, dislocation, or joint effusion. Ankle mortise is congruent. Minimal degenerative spurring within the medial gutter. Mild dorsal hypertrophy at the talonavicular joint. Small bidirectional calcaneal enthesophytes. No focal soft tissue swelling. IMPRESSION: Mild degenerative changes of the left ankle/midfoot. Electronically Signed   By: Davina Poke D.O.   On: 03/16/2020 10:52   DG Ankle 2 Views Right  Result Date: 03/16/2020 CLINICAL DATA:  Right ankle pain EXAM: RIGHT ANKLE - 2 VIEW COMPARISON:  None. FINDINGS: There is no evidence of fracture, dislocation, or joint effusion. Mild anterior tibiotalar osteophytosis. Mild degenerative spurring within the medial and lateral gutters. Soft tissues are unremarkable. IMPRESSION: Mild degenerative changes of the right ankle without acute findings. Electronically Signed   By: Davina Poke D.O.   On: 03/16/2020 10:52   CT Angio Chest PE W and/or Wo Contrast  Result Date: 03/16/2020 CLINICAL DATA:  Severe joint pain from neck down to the feet. Negative COVID test. EXAM: CT ANGIOGRAPHY CHEST WITH CONTRAST TECHNIQUE: Multidetector CT imaging of the chest was performed using the standard protocol during bolus administration of intravenous contrast. Multiplanar CT image reconstructions and MIPs were obtained to evaluate the vascular anatomy. CONTRAST:  177mL OMNIPAQUE IOHEXOL 350 MG/ML SOLN COMPARISON:  None. FINDINGS: Cardiovascular: Examination is limited by respiratory motion artifact. Within that limitation, there is no central pulmonary embolus. The size of the main pulmonary artery is normal. Heart size is normal, with no pericardial effusion. The course and caliber of the aorta are normal. There is no  atherosclerotic calcification. Opacification decreased due to pulmonary arterial phase contrast bolus timing. Mediastinum/Nodes: -- No mediastinal lymphadenopathy. -- No hilar lymphadenopathy. -- No axillary lymphadenopathy. -- No supraclavicular lymphadenopathy. --there is a left-sided thyroid nodule measuring 1 cm. No followup recommended (ref: J Am Coll Radiol. 2015 Feb;12(2): 143-50). -  Unremarkable esophagus. Lungs/Pleura: Airways are patent. No pleural  effusion, lobar consolidation, pneumothorax or pulmonary infarction. Upper Abdomen: Contrast bolus timing is not optimized for evaluation of the abdominal organs. The visualized portions of the organs of the upper abdomen are normal. Musculoskeletal: No chest wall abnormality. No bony spinal canal stenosis. Review of the MIP images confirms the above findings. IMPRESSION: 1. Examination is limited by respiratory motion artifact. Within that limitation, there is no central pulmonary embolus. 2. No acute cardiopulmonary process. Electronically Signed   By: Constance Holster M.D.   On: 03/16/2020 03:46   MR THORACIC SPINE WO CONTRAST  Result Date: 03/16/2020 CLINICAL DATA:  49 year old female with mid back pain. Generalized body aches. EXAM: MRI THORACIC SPINE WITHOUT CONTRAST TECHNIQUE: Multiplanar, multisequence MR imaging of the thoracic spine was performed. No intravenous contrast was administered. COMPARISON:  CTA chest 0312 hours today. FINDINGS: Limited cervical spine imaging:  Negative. Thoracic spine segmentation:  Appears to be normal. Alignment:  Relatively normal thoracic kyphosis.  No scoliosis. Vertebrae: Background bone marrow signal is within normal limits. Normal vertebral body signal. At the left T5 transverse process there is a small area of increased T2 and STIR signal (series 18, image 12), although comparison with the CT at this level favors a benign osseous hemangioma (and on axial T2 the area does appear somewhat stippled, series 20,  image 15). No other marrow edema or evidence of acute osseous abnormality. Cord: Capacious thoracic spinal canal at most levels. At levels where the canal is largest there is heterogeneous T2 and STIR signal dorsal to the cord (series 18, image 8), but this appears to be CSF pulsation artifact. Axial GRE images do not suggest any space-occupying mass or abnormal vessels within the thecal sac. No thoracic spinal cord signal abnormality identified. Conus medullaris appears grossly normal at T11-T12. Paraspinal and other soft tissues: Negative visible abdominal viscera; there is some bowel in some of the visible left renal fossa although a slightly low lying left kidney is suspected. STIR images suggest there is abnormal increased STIR signal in the bilateral thoracic paraspinal musculature (series 18, image 1 on the right, image 15 on the left). This is subtle on other sequences. No discrete muscle lesion. No paraspinal fluid collection or other inflammation. Disc levels: The following thoracic degenerative changes are noted: T1-T2: Negative. T2-T3: Negative. T3-T4: Negative. T4-T5: Tiny right paracentral disc herniation (series 20, image 13) effaces the ventral CSF space but there is no stenosis. T5-T6: Mild to moderate facet or ligament flavum hypertrophy. But no stenosis. T6-T7: Mild to moderate facet or ligament flavum hypertrophy greater on the right. No stenosis. T7-T8: Negative. T8-T9: Broad-based left paracentral disc protrusion (series 20, image 26) effaces the ventral CSF space but there is no significant stenosis. T9-T10: Mild to moderate facet and/or ligament flavum hypertrophy with mild to moderate bilateral T9 foraminal stenosis greater on the left. T10-T11: Mild to moderate facet hypertrophy and T10 foraminal stenosis which is fairly symmetric. T11-T12: Mild circumferential disc bulge. Mild to moderate posterior element hypertrophy. Mild right T11 foraminal stenosis. T12-L1: Negative. IMPRESSION: 1.  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 Other paraspinal soft tissues are normal. No convincing acute osseous abnormality (benign left T5 transverse process bone hemangioma suspected). 2. Thoracic spinal cord appears normal. Heterogeneous signal in the capacious dorsal spinal canal is felt due to CSF pulsation artifact. 3. Occasional small thoracic disc herniations but no spinal stenosis. Up to moderate thoracic posterior element degeneration with moderate bilateral T9 and T10 neural foraminal  stenosis. Electronically Signed   By: Odessa Fleming M.D.   On: 03/16/2020 12:13   MR Lumbar Spine W Wo Contrast  Result Date: 03/19/2020 CLINICAL DATA:  Low back pain. EXAM: MRI LUMBAR SPINE WITHOUT AND WITH CONTRAST TECHNIQUE: Multiplanar and multiecho pulse sequences of the lumbar spine were obtained without and with intravenous contrast. CONTRAST:  70mL GADAVIST GADOBUTROL 1 MMOL/ML IV SOLN COMPARISON:  CT abdomen and pelvis 03/19/2020. Thoracic spine MRI 03/16/2020. FINDINGS: Segmentation:  Standard. Alignment:  Normal. Vertebrae: No fracture, suspicious osseous lesion, significant marrow edema, or evidence of discitis. Chronic degenerative endplate changes at L5-S1. No epidural fluid collection. Conus medullaris and cauda equina: Conus extends to the T12 level. Conus and cauda equina appear normal. Paraspinal and other soft tissues: No paraspinal fluid collection. No significant lumbar paraspinal muscle edema in comparison to the thoracic edema on the prior MRI. 1.2 cm left renal cyst. Disc levels: L1-2 and L1-2: Normal discs.  Mild facet arthrosis without stenosis. L3-4: Minimal disc bulging and mild facet arthrosis without stenosis. L4-5: Mild disc bulging and mild facet arthrosis without stenosis. L5-S1: Disc desiccation and moderate disc space narrowing. Disc bulging, endplate spurring, disc space height loss, and mild facet arthrosis  result in mild right neural foraminal stenosis and mild left lateral recess stenosis without spinal stenosis. IMPRESSION: 1. No evidence of lumbar spine infection. 2. Lower lumbar disc and facet degeneration, greatest at L5-S1 where there is mild right neural foraminal and left lateral recess stenosis. Electronically Signed   By: Sebastian Ache M.D.   On: 03/19/2020 20:32   CT ABDOMEN PELVIS W CONTRAST  Result Date: 03/19/2020 CLINICAL DATA:  Diffuse joint pain with marked eosinophilia. Evaluate for splenomegaly, adenopathy or hepatomegaly. EXAM: CT ABDOMEN AND PELVIS WITH CONTRAST TECHNIQUE: Multidetector CT imaging of the abdomen and pelvis was performed using the standard protocol following bolus administration of intravenous contrast. CONTRAST:  OMNIPAQUE IOHEXOL 300 MG/ML  SOLN COMPARISON:  CT angio chest 03/16/2020 FINDINGS: Lower chest: No acute abnormality. Hepatobiliary: No focal liver abnormality is seen. The liver has an approximate volume of 2100 cc with a length of 19.58. No gallstones, gallbladder wall thickening, or biliary dilatation. Pancreas: Unremarkable. No pancreatic ductal dilatation or surrounding inflammatory changes. Spleen: This lean measures 14.3 by 10.3 x 6.2 cm (volume = 480 cm^3) no focal liver lesion identified. Adrenals/Urinary Tract: Normal appearance of the adrenal glands. Small simple appearing cyst within upper pole of left kidney measures 1.2 cm, image 28/2. The urinary bladder appears normal. Stomach/Bowel: Stomach is nondistended. No bowel wall thickening, inflammation, or distension. Vascular/Lymphatic: Aortic atherosclerosis. No aneurysm. No abdominal adenopathy. Prominent bilateral pelvic lymph nodes are identified a few of which are borderline enlarged including left external iliac node measuring 1.1 cm, image 79/2. Right external iliac lymph node measures 1.0 cm. Reproductive: Status post hysterectomy. No adnexal masses. Other: No free fluid or fluid collections  identified. Musculoskeletal: Similar to recent MR findings there is abnormal edema and soft tissue stranding surrounding the skeletal muscles compatible with generalized myositis. No aggressive lytic or sclerotic bone lesions identified. Disc space narrowing and endplate spurring is identified at L5-S1 along with vacuum disc phenomenon. However, the endplates appear irregular with loss of normal definition with increasing lucency. There is no surrounding increased soft tissue, soft tissue stranding or fluid collection. IMPRESSION: 1. Splenomegaly. 2. Abnormal edema and soft tissue stranding surrounding the skeletal muscles compatible with generalized myositis. 3. Disc space narrowing and endplate spurring at L5-S1 along with vacuum disc phenomenon.  However, the endplates appear irregular with loss of normal definition and increasing lucency. No surrounding increased soft tissue, soft tissue stranding or fluid collection. Findings are favored to represent degenerative disc disease. However, given the elevated white blood cell count, ESR, and CRP early discitis cannot be excluded. If there are clinical signs or symptoms suggestive of discitis at L5-S1 consider further investigation with contrast enhanced MRI of the lumbar spine. 4. Aortic atherosclerosis. Aortic Atherosclerosis (ICD10-I70.0). These results will be called to the ordering clinician or representative by the Radiologist Assistant, and communication documented in the PACS or [employer]. Electronically Signed   By: Kerby Moors M.D.   On: 03/19/2020 16:06   DG Hand 2 View Right  Result Date: 03/19/2020 CLINICAL DATA:  Pain EXAM: RIGHT HAND - 2 VIEW COMPARISON:  None. FINDINGS: Frontal and lateral views were obtained. No appreciable fracture or dislocation. Joint spaces appear unremarkable. No erosive change or periostitis. Bony mineralization unremarkable. IMPRESSION: No fracture or dislocation.  No appreciable arthropathic change.  Electronically Signed   By: Lowella Grip III M.D.   On: 03/19/2020 10:52   DG Hand 2 View Left  Result Date: 03/19/2020 CLINICAL DATA:  Pain EXAM: LEFT HAND - 2 VIEW COMPARISON:  None. FINDINGS: Frontal and lateral views were obtained. No fracture or dislocation. Joint spaces appear normal. No erosive change or periostitis. Bony mineralization normal. IMPRESSION: No fracture or dislocation.  No evident arthropathy. Electronically Signed   By: Lowella Grip III M.D.   On: 03/19/2020 10:56   CT BIOPSY  Result Date: 03/21/2020 INDICATION: Eosinophilia of uncertain etiology. Please perform CT-guided bone marrow biopsy tissue diagnostic purposes, including cytogenetics. EXAM: CT-GUIDED BONE MARROW BIOPSY AND ASPIRATION MEDICATIONS: None ANESTHESIA/SEDATION: None COMPLICATIONS: None immediate. PROCEDURE: Informed consent was obtained from the patient following an explanation of the procedure, risks, benefits and alternatives. The patient understands, agrees and consents for the procedure. All questions were addressed. A time out was performed prior to the initiation of the procedure. The patient was positioned prone and non-contrast localization CT was performed of the pelvis to demonstrate the iliac marrow spaces. The operative site was prepped and draped in the usual sterile fashion. Under sterile conditions and local anesthesia, a 22 gauge spinal needle was utilized for procedural planning. Next, an 11 gauge coaxial bone biopsy needle was advanced into the left iliac marrow space. Needle position was confirmed with CT imaging. Initially, a bone marrow aspiration was performed. Next, a bone marrow biopsy was obtained with the 11 gauge outer bone marrow device. Samples were prepared with the cytotechnologist and deemed adequate. The needle was removed and superficial hemostasis was obtained with manual compression. A dressing was applied. The patient tolerated the procedure well without immediate post  procedural complication. IMPRESSION: Successful CT guided left iliac bone marrow aspiration and core biopsy. Electronically Signed   By: Sandi Mariscal M.D.   On: 03/21/2020 13:53   DG Chest Portable 1 View  Result Date: 03/16/2020 CLINICAL DATA:  Shortness of breath EXAM: PORTABLE CHEST 1 VIEW COMPARISON:  None. FINDINGS: The heart size is mildly enlarged. Aortic calcifications are noted. There is no pneumothorax. No significant pleural effusion. No focal infiltrate. IMPRESSION: No active disease. Electronically Signed   By: Constance Holster M.D.   On: 03/16/2020 00:11   DG Foot 2 Views Left  Result Date: 03/19/2020 CLINICAL DATA:  Bilateral foot pain since March 2020 EXAM: LEFT FOOT - 2 VIEW; RIGHT FOOT - 2 VIEW COMPARISON:  None. FINDINGS: There is no  evidence of fracture or dislocation of the bilateral feet. Dorsal spurring/hypertrophy at the left talonavicular joint, likely reflects sequela of remote trauma. Overall, joint spaces of the bilateral feet are maintained. No bony erosion. No periostitis. Soft tissues are unremarkable. IMPRESSION: No acute osseous abnormality or significant arthropathy of the bilateral feet. Electronically Signed   By: Davina Poke D.O.   On: 03/19/2020 10:58   DG Foot 2 Views Right  Result Date: 03/19/2020 CLINICAL DATA:  Bilateral foot pain since March 2020 EXAM: LEFT FOOT - 2 VIEW; RIGHT FOOT - 2 VIEW COMPARISON:  None. FINDINGS: There is no evidence of fracture or dislocation of the bilateral feet. Dorsal spurring/hypertrophy at the left talonavicular joint, likely reflects sequela of remote trauma. Overall, joint spaces of the bilateral feet are maintained. No bony erosion. No periostitis. Soft tissues are unremarkable. IMPRESSION: No acute osseous abnormality or significant arthropathy of the bilateral feet. Electronically Signed   By: Davina Poke D.O.   On: 03/19/2020 10:58   CT BONE MARROW BIOPSY & ASPIRATION  Result Date: 03/21/2020 INDICATION:  Eosinophilia of uncertain etiology. Please perform CT-guided bone marrow biopsy tissue diagnostic purposes, including cytogenetics. EXAM: CT-GUIDED BONE MARROW BIOPSY AND ASPIRATION MEDICATIONS: None ANESTHESIA/SEDATION: None COMPLICATIONS: None immediate. PROCEDURE: Informed consent was obtained from the patient following an explanation of the procedure, risks, benefits and alternatives. The patient understands, agrees and consents for the procedure. All questions were addressed. A time out was performed prior to the initiation of the procedure. The patient was positioned prone and non-contrast localization CT was performed of the pelvis to demonstrate the iliac marrow spaces. The operative site was prepped and draped in the usual sterile fashion. Under sterile conditions and local anesthesia, a 22 gauge spinal needle was utilized for procedural planning. Next, an 11 gauge coaxial bone biopsy needle was advanced into the left iliac marrow space. Needle position was confirmed with CT imaging. Initially, a bone marrow aspiration was performed. Next, a bone marrow biopsy was obtained with the 11 gauge outer bone marrow device. Samples were prepared with the cytotechnologist and deemed adequate. The needle was removed and superficial hemostasis was obtained with manual compression. A dressing was applied. The patient tolerated the procedure well without immediate post procedural complication. IMPRESSION: Successful CT guided left iliac bone marrow aspiration and core biopsy. Electronically Signed   By: Sandi Mariscal M.D.   On: 03/21/2020 13:53   ECHOCARDIOGRAM COMPLETE  Result Date: 03/16/2020    ECHOCARDIOGRAM REPORT   Patient Name:   VILA DORY Date of Exam: 03/16/2020 Medical Rec #:  [id number]     Height:       74.0 in Accession #:    [id number]    Weight:       220.0 lb Date of Birth:  05/23/1970      BSA:          2.263 m Patient Age:    27 years      BP:           139/84 mmHg Patient Gender: F              HR:           82 bpm. Exam Location:  Inpatient Procedure: 2D Echo, Cardiac Doppler and Color Doppler Indications:    Dyspnea 786.0/R06.00  History:        Patient has no prior history of Echocardiogram examinations.                 Risk  Factors:Hypertension and Diabetes. COVID-19.  Sonographer:    Jonelle Sidle Dance Referring Phys: [place]  1. Left ventricular ejection fraction, by estimation, is 60 to 65%. The left ventricle has normal function. The left ventricle has no regional wall motion abnormalities. Left ventricular diastolic parameters were normal.  2. Right ventricular systolic function is normal. The right ventricular size is normal. Tricuspid regurgitation signal is inadequate for assessing PA pressure.  3. A small pericardial effusion is present.  4. The mitral valve is normal in structure. Trivial mitral valve regurgitation. No evidence of mitral stenosis.  5. The aortic valve is tricuspid. Aortic valve regurgitation is trivial. No aortic stenosis is present.  6. The inferior vena cava is normal in size with greater than 50% respiratory variability, suggesting right atrial pressure of 3 mmHg. Comparison(s): No prior Echocardiogram. Conclusion(s)/Recommendation(s): Otherwise normal echocardiogram, with minor abnormalities described in the report. FINDINGS  Left Ventricle: Left ventricular ejection fraction, by estimation, is 60 to 65%. The left ventricle has normal function. The left ventricle has no regional wall motion abnormalities. The left ventricular internal cavity size was normal in size. There is  no left ventricular hypertrophy. Left ventricular diastolic parameters were normal. Right Ventricle: The right ventricular size is normal. No increase in right ventricular wall thickness. Right ventricular systolic function is normal. Tricuspid regurgitation signal is inadequate for assessing PA pressure. Left Atrium: Left atrial size was normal in size. Right Atrium: Right  atrial size was normal in size. Pericardium: A small pericardial effusion is present. Mitral Valve: The mitral valve is normal in structure. Trivial mitral valve regurgitation. No evidence of mitral valve stenosis. Tricuspid Valve: The tricuspid valve is normal in structure. Tricuspid valve regurgitation is trivial. No evidence of tricuspid stenosis. Aortic Valve: The aortic valve is tricuspid. Aortic valve regurgitation is trivial. Aortic regurgitation PHT measures 765 msec. No aortic stenosis is present. Pulmonic Valve: The pulmonic valve was grossly normal. Pulmonic valve regurgitation is not visualized. No evidence of pulmonic stenosis. Aorta: The aortic root, ascending aorta, aortic arch and descending aorta are all structurally normal, with no evidence of dilitation or obstruction. Venous: The inferior vena cava is normal in size with greater than 50% respiratory variability, suggesting right atrial pressure of 3 mmHg. IAS/Shunts: No atrial level shunt detected by color flow Doppler.  LEFT VENTRICLE PLAX 2D LVIDd:         4.90 cm  Diastology LVIDs:         3.30 cm  LV e' medial:    9.14 cm/s LV PW:         0.90 cm  LV E/e' medial:  8.4 LV IVS:        1.10 cm  LV e' lateral:   10.80 cm/s LVOT diam:     1.80 cm  LV E/e' lateral: 7.1 LV SV:         66 LV SV Index:   29 LVOT Area:     2.54 cm  RIGHT VENTRICLE RV Basal diam:  2.60 cm RV S prime:     14.50 cm/s TAPSE (M-mode): 2.6 cm LEFT ATRIUM             Index       RIGHT ATRIUM           Index LA diam:        4.40 cm 1.94 cm/m  RA Area:     13.70 cm LA Vol (A2C):   69.4 ml 30.66 ml/m RA Volume:  29.50 ml  13.03 ml/m LA Vol (A4C):   42.9 ml 18.95 ml/m LA Biplane Vol: 55.7 ml 24.61 ml/m  AORTIC VALVE LVOT Vmax:   134.00 cm/s LVOT Vmean:  89.300 cm/s LVOT VTI:    0.259 m AI PHT:      765 msec  AORTA Ao Root diam: 3.30 cm Ao Asc diam:  3.20 cm MITRAL VALVE MV Area (PHT): 3.03 cm    SHUNTS MV Decel Time: 250 msec    Systemic VTI:  0.26 m MV E velocity: 77.10  cm/s  Systemic Diam: 1.80 cm MV A velocity: 79.30 cm/s MV E/A ratio:  0.97 Buford Dresser MD Electronically signed by Buford Dresser MD Signature Date/Time: 03/16/2020/6:24:47 PM    Final    VAS [place] LOWER EXTREMITY VENOUS (DVT)  Result Date: 03/16/2020  Lower Venous DVT Study Indications: Edema.  Risk Factors: None identified. Limitations: Poor ultrasound/tissue interface. Comparison Study: No prior studies. Performing Technologist: Oliver Hum RVT  Examination Guidelines: A complete evaluation includes B-mode imaging, spectral Doppler, color Doppler, and power Doppler as needed of all accessible portions of each vessel. Bilateral testing is considered an integral part of a complete examination. Limited examinations for reoccurring indications may be performed as noted. The reflux portion of the exam is performed with the patient in reverse Trendelenburg.  +---------+---------------+---------+-----------+----------+--------------+ RIGHT    CompressibilityPhasicitySpontaneityPropertiesThrombus Aging +---------+---------------+---------+-----------+----------+--------------+ CFV      Full           Yes      Yes                                 +---------+---------------+---------+-----------+----------+--------------+ SFJ      Full                                                        +---------+---------------+---------+-----------+----------+--------------+ FV Prox  Full                                                        +---------+---------------+---------+-----------+----------+--------------+ FV Mid   Full                                                        +---------+---------------+---------+-----------+----------+--------------+ FV DistalFull                                                        +---------+---------------+---------+-----------+----------+--------------+ PFV      Full                                                         +---------+---------------+---------+-----------+----------+--------------+ POP  Full           Yes      Yes                                 +---------+---------------+---------+-----------+----------+--------------+ PTV      Full                                                        +---------+---------------+---------+-----------+----------+--------------+ PERO     Full                                                        +---------+---------------+---------+-----------+----------+--------------+   +---------+---------------+---------+-----------+----------+--------------+ LEFT     CompressibilityPhasicitySpontaneityPropertiesThrombus Aging +---------+---------------+---------+-----------+----------+--------------+ CFV      Full           Yes      Yes                                 +---------+---------------+---------+-----------+----------+--------------+ SFJ      Full                                                        +---------+---------------+---------+-----------+----------+--------------+ FV Prox  Full                                                        +---------+---------------+---------+-----------+----------+--------------+ FV Mid   Full                                                        +---------+---------------+---------+-----------+----------+--------------+ FV DistalFull                                                        +---------+---------------+---------+-----------+----------+--------------+ PFV      Full                                                        +---------+---------------+---------+-----------+----------+--------------+ POP      Full           Yes      Yes                                 +---------+---------------+---------+-----------+----------+--------------+  PTV      Full                                                         +---------+---------------+---------+-----------+----------+--------------+ PERO     Full                                                        +---------+---------------+---------+-----------+----------+--------------+     Summary: RIGHT: - There is no evidence of deep vein thrombosis in the lower extremity.  - No cystic structure found in the popliteal fossa.  LEFT: - There is no evidence of deep vein thrombosis in the lower extremity.  - No cystic structure found in the popliteal fossa.  *See table(s) above for measurements and observations. Electronically signed by Monica Martinez MD on 03/16/2020 at 3:01:33 PM.    Final      Time coordinating discharge: Over 28 minutes    Kaitlin Dee, MD  [facility] 03/31/2020, 4:06 PM

## 2020-03-31 NOTE — Discharge Instructions (Signed)
Make sure to follow-up with rheumatology within 2 weeks. Contact the hospital if this is not possible for assistance with appointment.

## 2020-04-01 LAB — GLUCOSE, CAPILLARY
Glucose-Capillary: 223 mg/dL — ABNORMAL HIGH (ref 70–99)
Glucose-Capillary: 351 mg/dL — ABNORMAL HIGH (ref 70–99)

## 2020-04-05 ENCOUNTER — Encounter (HOSPITAL_COMMUNITY): Payer: Self-pay | Admitting: Hematology & Oncology

## 2020-04-05 ENCOUNTER — Telehealth: Payer: Self-pay | Admitting: Hematology & Oncology

## 2020-04-05 NOTE — Telephone Encounter (Signed)
Called and advised patient of upcoming appointments scheduled letter/calendar was mailed as well/ per  12/13 staff message

## 2020-04-11 ENCOUNTER — Inpatient Hospital Stay (INDEPENDENT_AMBULATORY_CARE_PROVIDER_SITE_OTHER): Payer: Medicaid Other | Admitting: Primary Care

## 2020-04-13 LAB — MISC LABCORP TEST (SEND OUT): Labcorp test code: 9985

## 2020-04-26 ENCOUNTER — Inpatient Hospital Stay: Payer: Medicaid Other | Admitting: Hematology & Oncology

## 2020-04-26 ENCOUNTER — Inpatient Hospital Stay: Payer: Medicaid Other

## 2020-04-27 ENCOUNTER — Inpatient Hospital Stay: Payer: Self-pay | Admitting: Nurse Practitioner

## 2020-04-28 ENCOUNTER — Inpatient Hospital Stay (INDEPENDENT_AMBULATORY_CARE_PROVIDER_SITE_OTHER): Payer: Medicaid Other | Admitting: Primary Care

## 2020-04-29 LAB — SURGICAL PATHOLOGY

## 2020-06-03 ENCOUNTER — Other Ambulatory Visit: Payer: Self-pay | Admitting: Specialist

## 2020-06-16 ENCOUNTER — Other Ambulatory Visit: Payer: Self-pay

## 2020-06-16 ENCOUNTER — Encounter: Payer: Self-pay | Admitting: Ophthalmology

## 2020-06-17 ENCOUNTER — Other Ambulatory Visit
Admission: RE | Admit: 2020-06-17 | Discharge: 2020-06-17 | Disposition: A | Discharge status: Home / Self Care | Payer: BC Managed Care – PPO | Source: Ambulatory Visit | Attending: Ophthalmology | Admitting: Ophthalmology

## 2020-06-17 DIAGNOSIS — Z01812 Encounter for preprocedural laboratory examination: Secondary | ICD-10-CM | POA: Insufficient documentation

## 2020-06-17 DIAGNOSIS — Z20822 Contact with and (suspected) exposure to covid-19: Secondary | ICD-10-CM | POA: Diagnosis not present

## 2020-06-17 LAB — SARS CORONAVIRUS 2 (TAT 6-24 HRS): SARS Coronavirus 2: NEGATIVE

## 2020-06-20 NOTE — Discharge Instructions (Signed)

## 2020-06-21 ENCOUNTER — Ambulatory Visit
Admission: RE | Admit: 2020-06-21 | Discharge: 2020-06-21 | Disposition: A | Discharge status: Home / Self Care | Payer: BC Managed Care – PPO | Source: Ambulatory Visit | Attending: Ophthalmology | Admitting: Ophthalmology

## 2020-06-21 ENCOUNTER — Other Ambulatory Visit: Payer: Self-pay

## 2020-06-21 ENCOUNTER — Encounter: Payer: Self-pay | Admitting: Ophthalmology

## 2020-06-21 ENCOUNTER — Ambulatory Visit: Payer: BC Managed Care – PPO | Admitting: Anesthesiology

## 2020-06-21 ENCOUNTER — Encounter
Admission: RE | Disposition: A | Discharge status: Home / Self Care | Payer: Self-pay | Source: Ambulatory Visit | Attending: Ophthalmology

## 2020-06-21 DIAGNOSIS — Z87891 Personal history of nicotine dependence: Secondary | ICD-10-CM | POA: Insufficient documentation

## 2020-06-21 DIAGNOSIS — H2512 Age-related nuclear cataract, left eye: Secondary | ICD-10-CM | POA: Insufficient documentation

## 2020-06-21 DIAGNOSIS — Z880 Allergy status to penicillin: Secondary | ICD-10-CM | POA: Insufficient documentation

## 2020-06-21 DIAGNOSIS — Z885 Allergy status to narcotic agent status: Secondary | ICD-10-CM | POA: Insufficient documentation

## 2020-06-21 DIAGNOSIS — Z833 Family history of diabetes mellitus: Secondary | ICD-10-CM | POA: Diagnosis not present

## 2020-06-21 DIAGNOSIS — Z79899 Other long term (current) drug therapy: Secondary | ICD-10-CM | POA: Diagnosis not present

## 2020-06-21 DIAGNOSIS — Z91040 Latex allergy status: Secondary | ICD-10-CM | POA: Diagnosis not present

## 2020-06-21 DIAGNOSIS — E1136 Type 2 diabetes mellitus with diabetic cataract: Secondary | ICD-10-CM | POA: Diagnosis not present

## 2020-06-21 DIAGNOSIS — Z794 Long term (current) use of insulin: Secondary | ICD-10-CM | POA: Diagnosis not present

## 2020-06-21 DIAGNOSIS — Z8616 Personal history of COVID-19: Secondary | ICD-10-CM | POA: Insufficient documentation

## 2020-06-21 HISTORY — PX: CATARACT EXTRACTION W/PHACO: SHX586

## 2020-06-21 LAB — GLUCOSE, CAPILLARY
Glucose-Capillary: 345 mg/dL — ABNORMAL HIGH (ref 70–99)
Glucose-Capillary: 325 mg/dL — ABNORMAL HIGH (ref 70–99)

## 2020-06-21 SURGERY — PHACOEMULSIFICATION, CATARACT, WITH IOL INSERTION
Anesthesia: Monitor Anesthesia Care | Site: Eye | Laterality: Left

## 2020-06-21 MED ORDER — ACETYLCHOLINE CHLORIDE 20 MG IO SOLR
INTRAOCULAR | Status: DC | PRN
  Administered 2020-06-21: 20 mg via INTRAOCULAR

## 2020-06-21 MED ORDER — ACETAMINOPHEN 160 MG/5ML PO SOLN: 325.0000 mg | Freq: Once | ORAL | Status: DC

## 2020-06-21 MED ORDER — ACETAMINOPHEN 325 MG PO TABS: 325.0000 mg | ORAL_TABLET | Freq: Once | ORAL | Status: DC

## 2020-06-21 MED ORDER — TETRACAINE HCL 0.5 % OP SOLN
1.0000 [drp] | OPHTHALMIC | Status: DC | PRN
  Administered 2020-06-21 (×3): 1 [drp] via OPHTHALMIC

## 2020-06-21 MED ORDER — ARMC OPHTHALMIC DILATING DROPS
1.0000 "application " | OPHTHALMIC | Status: DC | PRN
  Administered 2020-06-21 (×3): 1 via OPHTHALMIC

## 2020-06-21 MED ORDER — MIDAZOLAM HCL 2 MG/2ML IJ SOLN
INTRAMUSCULAR | Status: DC | PRN
  Administered 2020-06-21 (×2): 1 mg via INTRAVENOUS
  Administered 2020-06-21: 2 mg via INTRAVENOUS

## 2020-06-21 MED ORDER — MOXIFLOXACIN HCL 0.5 % OP SOLN
OPHTHALMIC | Status: DC | PRN
  Administered 2020-06-21: 0.2 mL via OPHTHALMIC

## 2020-06-21 MED ORDER — BRIMONIDINE TARTRATE-TIMOLOL 0.2-0.5 % OP SOLN
OPHTHALMIC | Status: DC | PRN
  Administered 2020-06-21: 1 [drp] via OPHTHALMIC

## 2020-06-21 MED ORDER — INSULIN LISPRO 100 UNIT/ML ~~LOC~~ SOLN
4.0000 [IU] | Freq: Once | SUBCUTANEOUS | Status: AC
  Administered 2020-06-21: 08:00:00 4 [IU] via SUBCUTANEOUS

## 2020-06-21 MED ORDER — EPINEPHRINE PF 1 MG/ML IJ SOLN
INTRAOCULAR | Status: DC | PRN
  Administered 2020-06-21: 47 mL via OPHTHALMIC

## 2020-06-21 MED ORDER — DEXMEDETOMIDINE HCL 200 MCG/2ML IV SOLN
INTRAVENOUS | Status: DC | PRN
  Administered 2020-06-21: 15 ug via INTRAVENOUS
  Administered 2020-06-21: 20 ug via INTRAVENOUS
  Administered 2020-06-21: 15 ug via INTRAVENOUS

## 2020-06-21 MED ORDER — FENTANYL CITRATE (PF) 100 MCG/2ML IJ SOLN
INTRAMUSCULAR | Status: DC | PRN
  Administered 2020-06-21 (×2): 100 ug via INTRAVENOUS

## 2020-06-21 MED ORDER — NA CHONDROIT SULF-NA HYALURON 40-17 MG/ML IO SOLN
INTRAOCULAR | Status: DC | PRN
  Administered 2020-06-21: 1 mL via INTRAOCULAR

## 2020-06-21 MED ORDER — LIDOCAINE HCL (PF) 2 % IJ SOLN
INTRAOCULAR | Status: DC | PRN
  Administered 2020-06-21: 1 mL

## 2020-06-21 MED ORDER — LACTATED RINGERS IV SOLN: INTRAVENOUS | Status: DC

## 2020-06-21 SURGICAL SUPPLY — 21 items
CANNULA ANT/CHMB 27GA (MISCELLANEOUS) ×4 IMPLANT
FORCEPS MICRO-HOLDING 23GA (INSTRUMENTS) ×2 IMPLANT
GLOVE PI ULTRA LF STRL 7.5 (GLOVE) ×3 IMPLANT
GLOVE PI ULTRA NON LATEX 7.5 (GLOVE) ×3
GOWN STRL REUS W/ TWL LRG LVL3 (GOWN DISPOSABLE) ×2 IMPLANT
GOWN STRL REUS W/TWL LRG LVL3 (GOWN DISPOSABLE) ×4
LENS IOL ACRSF MP 21.5 (Intraocular Lens) ×3 IMPLANT
LENS IOL ACRYSOF POST 21.5 (Intraocular Lens) ×6 IMPLANT
LENS IOL TECNIS EYHANCE 22.0 (Intraocular Lens) IMPLANT
MARKER SKIN DUAL TIP RULER LAB (MISCELLANEOUS) ×2 IMPLANT
NEEDLE FILTER BLUNT 18X 1/2SAF (NEEDLE) ×1
NEEDLE FILTER BLUNT 18X1 1/2 (NEEDLE) ×1 IMPLANT
PACK EYE AFTER SURG (MISCELLANEOUS) ×2 IMPLANT
PACK OPTHALMIC (MISCELLANEOUS) ×2 IMPLANT
PACK PORFILIO (MISCELLANEOUS) ×2 IMPLANT
SUT ETHILON 10-0 CS-B-6CS-B-6 (SUTURE) ×2
SUTURE EHLN 10-0 CS-B-6CS-B-6 (SUTURE) ×1 IMPLANT
SYR 3ML LL SCALE MARK (SYRINGE) ×2 IMPLANT
SYR TB 1ML LUER SLIP (SYRINGE) ×2 IMPLANT
WATER STERILE IRR 250ML POUR (IV SOLUTION) ×2 IMPLANT
WIPE NON LINTING 3.25X3.25 (MISCELLANEOUS) ×2 IMPLANT

## 2020-06-21 NOTE — Anesthesia Postprocedure Evaluation (Signed)
Anesthesia Post Note  Patient: Kaitlin Holloway  Procedure(s) Performed: CATARACT EXTRACTION PHACO AND INTRAOCULAR LENS PLACEMENT (IOC) LEFT (Left Eye)     Patient location during evaluation: PACU Anesthesia Type: MAC Level of consciousness: awake and alert and oriented Pain management: satisfactory to patient Vital Signs Assessment: post-procedure vital signs reviewed and stable Respiratory status: spontaneous breathing, nonlabored ventilation and respiratory function stable Cardiovascular status: blood pressure returned to baseline and stable Postop Assessment: Adequate PO intake and No signs of nausea or vomiting Anesthetic complications: no   No complications documented.  Raliegh Ip

## 2020-06-21 NOTE — Anesthesia Preprocedure Evaluation (Signed)
Anesthesia Evaluation  Patient identified by MRN, date of birth, ID band Patient awake    Reviewed: Allergy & Precautions, H&P , NPO status , Patient's Chart, lab work & pertinent test results  Airway Mallampati: II  TM Distance: >3 FB Neck ROM: full    Dental no notable dental hx.    Pulmonary former smoker,    Pulmonary exam normal breath sounds clear to auscultation       Cardiovascular hypertension, Normal cardiovascular exam Rhythm:regular Rate:Normal     Neuro/Psych    GI/Hepatic   Endo/Other  diabetes, Poorly Controlled  Renal/GU      Musculoskeletal   Abdominal   Peds  Hematology   Anesthesia Other Findings   Reproductive/Obstetrics                             Anesthesia Physical Anesthesia Plan  ASA: II  Anesthesia Plan: MAC   Post-op Pain Management:    Induction:   PONV Risk Score and Plan: 2 and Treatment may vary due to age or medical condition, TIVA and Midazolam  Airway Management Planned:   Additional Equipment:   Intra-op Plan:   Post-operative Plan:   Informed Consent: I have reviewed the patients History and Physical, chart, labs and discussed the procedure including the risks, benefits and alternatives for the proposed anesthesia with the patient or authorized representative who has indicated his/her understanding and acceptance.     Dental Advisory Given  Plan Discussed with: CRNA  Anesthesia Plan Comments:         Anesthesia Quick Evaluation

## 2020-06-21 NOTE — H&P (Signed)
[facility]   Primary Care Physician:  Julieanne Manson, MD Ophthalmologist: Dr. Willey Blade  Pre-Procedure History & Physical: HPI:  Kaitlin Holloway is a 50 y.o. female here for cataract surgery.   Past Medical History:  Diagnosis Date  . Arthritis    knee  . COVID-19 06/2018   Residual joint pain  . Diabetes mellitus without complication (HCC)    Type 2, does not take her meds.  . Hypertension 2002  . Joint pain    since having COVID 06/2018  . Motion sickness    boats, car - back seat  . Prediabetes 2016    Past Surgical History:  Procedure Laterality Date  . ABDOMINAL HYSTERECTOMY  2016   unilateral oophorectomy.  Does not know what side.  For fibroids and heavy bleeding.  . APPENDECTOMY     age 43  . CATARACT EXTRACTION W/PHACO Right 01/28/2020   Procedure: CATARACT EXTRACTION PHACO AND INTRAOCULAR LENS PLACEMENT (IOC) RIGHT VISION BLUE 13.73  01:10.8;  Surgeon: Elliot Cousin, MD;  Location: [facility];  Service: Ophthalmology;  Laterality: Right;  Diabetes Latex  . MUSCLE BIOPSY Left 03/28/2020   Procedure: LEFT THIGH MUSCLE BIOPSY;  Surgeon: Quentin Ore, MD;  Location: [facility];  Service: General;  Laterality: Left;  . TONSILLECTOMY      Prior to Admission medications   Medication Sig Start Date End Date Taking? Authorizing Provider  glipiZIDE (GLUCOTROL) 10 MG tablet Take 1 tablet (10 mg total) by mouth 2 (two) times daily before a meal. 03/31/20  Yes Lewie Chamber, MD  losartan (COZAAR) 25 MG tablet Take 1 tablet (25 mg total) by mouth daily. 04/01/20  Yes Lewie Chamber, MD  metoprolol tartrate (LOPRESSOR) 25 MG tablet Take 0.5 tablets (12.5 mg total) by mouth 2 (two) times daily. 03/31/20  Yes Lewie Chamber, MD  insulin isophane & regular human (NOVOLIN 70/30 FLEXPEN RELION) (70-30) 100 UNIT/ML KwikPen Inject 10 Units into the skin 2 (two) times daily. Patient not taking: Reported on 06/16/2020 03/31/20   Lewie Chamber, MD  Insulin Pen  Needle 32G X 4 MM MISC 1 each by Does not apply route 4 (four) times daily -  before meals and at bedtime. 03/31/20   Lewie Chamber, MD  insulin regular (NOVOLIN R) 100 units/mL injection Inject 0.01-0.09 mLs (1-9 Units total) into the skin 4 (four) times daily -  before meals and at bedtime. Glucose 121 - 150: 1 unit, Glucose 151 - 200: 2 units, Glucose 201 - 250: 3 units, Glucose 251 - 300: 5 units, Glucose 301 - 350: 7 units, Glucose 351 - 400: 9 units, Glucose > 400, call MD Patient not taking: Reported on 06/16/2020 03/31/20   Lewie Chamber, MD  predniSONE (DELTASONE) 20 MG tablet Take 2 tablets (40 mg total) by mouth daily with breakfast. Patient not taking: Reported on 06/16/2020 03/31/20   Lewie Chamber, MD  lisinopril-hydrochlorothiazide (ZESTORETIC) 20-12.5 MG tablet Take 1 tablet by mouth daily. Patient not taking: Reported on 07/11/2018 12/06/17 08/14/19  Azalia Bilis, MD    Allergies as of 05/12/2020 - Review Complete 03/28/2020  Allergen Reaction Noted  . Penicillins Other (See Comments) 12/06/2017  . Aspirin Other (See Comments) 01/22/2020  . Garlic Swelling 01/25/2020  . Morphine and related Other (See Comments) 12/06/2017  . Gadavist [gadobutrol] Nausea And Vomiting 03/19/2020  . Latex Rash 01/25/2020    Family History  Problem Relation Age of Onset  . Heart disease Mother 24       AMI  .  Diabetes Mother   . Hypertension Mother   . Hyperlipidemia Mother     Social History   Socioeconomic History  . Marital status: Single    Spouse name: Not on file  . Number of children: 1  . Years of education: Not on file  . Highest education level: Master's degree (e.g., MA, MS, MEng, MEd, MSW, MBA)  Occupational History  . Occupation: Database administrator.  Tobacco Use  . Smoking status: Former Smoker    Packs/day: 1.50    Years: 17.00    Pack years: 25.50    Types: Cigarettes    Quit date: 07/10/2000    Years since quitting: 19.9  . Smokeless tobacco: Never  Used  Vaping Use  . Vaping Use: Never used  Substance and Sexual Activity  . Alcohol use: No    Comment: rare  . Drug use: No    Comment: MJ when young  . Sexual activity: Not on file  Other Topics Concern  . Not on file  Social History Narrative   Lives alone, no support.Marland Kitchen   Has been hard during COVID19 pandemic.    Social Determinants of Health   Financial Resource Strain: Not on file  Food Insecurity: Not on file  Transportation Needs: Not on file  Physical Activity: Not on file  Stress: Not on file  Social Connections: Not on file  Intimate Partner Violence: Not on file    Review of Systems: See HPI, otherwise negative ROS  Physical Exam: BP 122/69   Pulse 81   Temp (!) 97.4 F (36.3 C) (Temporal)   Resp 17   Ht 6\' 2"  (1.88 m)   Wt 109.8 kg   SpO2 98%   BMI 31.07 kg/m  General:   Alert,  pleasant and cooperative in NAD Head:  Normocephalic and atraumatic. Respiratory:  Normal work of breathing.  Impression/Plan: Kaitlin Holloway is here for cataract surgery.  Risks, benefits, limitations, and alternatives regarding cataract surgery have been reviewed with the patient.  Questions have been answered.  All parties agreeable.   Audery Amel, MD  06/21/2020, 8:33 AM

## 2020-06-21 NOTE — Anesthesia Procedure Notes (Signed)
Procedure Name: MAC Date/Time: 06/21/2020 8:49 AM Performed by: Silvana Newness, CRNA Pre-anesthesia Checklist: Patient identified, Emergency Drugs available, Suction available, Patient being monitored and Timeout performed Patient Re-evaluated:Patient Re-evaluated prior to induction Oxygen Delivery Method: Nasal cannula Placement Confirmation: positive ETCO2

## 2020-06-21 NOTE — Transfer of Care (Signed)
Immediate Anesthesia Transfer of Care Note  Patient: Kaitlin Holloway  Procedure(s) Performed: CATARACT EXTRACTION PHACO AND INTRAOCULAR LENS PLACEMENT (IOC) LEFT (Left Eye)  Patient Location: PACU  Anesthesia Type: MAC  Level of Consciousness: awake, alert  and patient cooperative  Airway and Oxygen Therapy: Patient Spontanous Breathing and Patient connected to supplemental oxygen  Post-op Assessment: Post-op Vital signs reviewed, Patient's Cardiovascular Status Stable, Respiratory Function Stable, Patent Airway and No signs of Nausea or vomiting  Post-op Vital Signs: Reviewed and stable  Complications: No complications documented.

## 2020-06-21 NOTE — Op Note (Signed)
PREOPERATIVE DIAGNOSIS:  Nuclear sclerotic cataract of the left eye.   POSTOPERATIVE DIAGNOSIS:  Nuclear sclerotic cataract of the left eye.   OPERATIVE PROCEDURE:@   SURGEON:  Galen Manila, MD.   ANESTHESIA:  Anesthesiologist: Ranee Gosselin, MD CRNA: Michaele Offer, CRNA  1.      Managed anesthesia care. 2.     0.42ml of Shugarcaine was instilled following the paracentesis   COMPLICATIONS:  Following the complete removal of the nucleus, a linear rent in the capsule was discovered. No vitreous was presenting. Viscoat ws placed in the capsule to tamponade the rent. Some thin cortical material remained in the equator. 3 piece lens was placed in the sulcus with posterior capture of the optic.    TECHNIQUE:   Stop and chop   DESCRIPTION OF PROCEDURE:  The patient was examined and consented in the preoperative holding area where the aforementioned topical anesthesia was applied to the left eye and then brought back to the Operating Room where the left eye was prepped and draped in the usual sterile ophthalmic fashion and a lid speculum was placed. A paracentesis was created with the side port blade and the anterior chamber was filled with viscoelastic. A near clear corneal incision was performed with the steel keratome. A continuous curvilinear capsulorrhexis was performed with a cystotome followed by the capsulorrhexis forceps. Hydrodissection and hydrodelineation were carried out with BSS on a blunt cannula. The lens was removed in a stop and chop  technique and the remaining cortical material was removed with the irrigation-aspiration handpiece. The capsular bag was inflated with viscoelastic and the [facility] lens was placed in the sulcus without complication. The remaining viscoelastic was removed from the eye with the irrigation-aspiration handpiece. Miochol was placed. The wounds were hydrated. The anterior chamber was flushed with BSS and the eye was inflated to physiologic pressure. 0.61ml  Vigamox was placed in the anterior chamber. The wounds were found to be water tight. The eye was dressed with Combigan. The patient was given protective glasses to wear throughout the day and a shield with which to sleep tonight. The patient was also given drops with which to begin a drop regimen today and will follow-up with me in one day. Implant Name Type Inv. Item Serial No. Manufacturer Lot No. LRB No. Used Action  LENS IOL TECNIS EYHANCE 22.0 - [id number] Intraocular Lens LENS IOL TECNIS EYHANCE 22.0 [id number] Upadhyay   Left 1 Wasted  LENS IOL ACRYSOF POST 21.5 - [id number] Intraocular Lens LENS IOL ACRYSOF POST 21.5 [id number] ALCON  Left 1 Implanted and Explanted  LENS IOL ACRYSOF POST 21.5 - [id number] Intraocular Lens LENS IOL ACRYSOF POST 21.5 [id number] ALCON  Left 1 Implanted and Explanted  LENS IOL ACRYSOF POST 21.5 - [id number] Intraocular Lens LENS IOL ACRYSOF POST 21.5 [id number] ALCON  Left 1 Implanted    Procedure(s) with comments: CATARACT EXTRACTION PHACO AND INTRAOCULAR LENS PLACEMENT (IOC) LEFT (Left) - 18.17 1:22.4  Electronically signed: Galen Manila 06/21/2020 9:53 AM

## 2020-06-22 ENCOUNTER — Encounter: Payer: Self-pay | Admitting: Ophthalmology

## 2020-06-23 NOTE — Progress Notes (Signed)
[facility]    Provider:  Dr Jaynee Eagles Requesting Provider: Lorenza Chick, MD Primary Care Provider:  Mack Hook, MD  CC:  Proximal muscle weakness, biopsy +inflammatory myopathy  HPI:  Kaitlin Holloway is a 49 y.o. female here as requested by Lorenza Chick, MD for proximal muscle weakness. PMHx  joint pain, hypertension, diabetes mellitus, arthritis, myositis.  I reviewed notes from inpatient admission November 23 through March 31, 2020, medication noncompliance, was seen in the emergency room for diffuse joint pain muscle aches, weakness, upper back pain, extensive evaluation in house since admission included RPR HIV negative, CK slightly elevated 1219 improved to 360, CT chest abdomen pelvis, skeletal muscle soft tissue stranding abnormal edema compatible with generalized myositis and thoracic erector muscles, L5-S1 disc base narrowing and endplate spurring, no evidence of lumbar spine infection, disc and facet degeneration, undergoing rheumatologic work-up with CRP and ESR not significantly elevated, RF was greater than 650, ANA negative, no monoclonal protein, she underwent bone marrow biopsy after being seen by hematology due to persistent leukocytosis and eosinophilia unclear diagnosis, status post muscle biopsy left thigh, started on steroid with clinical improvement.  Psych was consulted and she denied suicidal ideation and was cleared (she told one of the attendings if we did not figured out soon she wanted to just die).  She developed a rash on her thigh. Fungus on her toes. She has joint pain all over, rashes, swelling in her feet and ankles, her RF was off the charts, she stopped the steroids. We discussed steroids and methotrexate. She has rashes and swelling. Everything started after covid. She had covid March 2020. Between 204 days later she was sick, she developed a rash, looked like heat rash, she had to physically pick up her arms and legs to go to the  bathroom, it was a 20 minute watch, she couldn't use her hands. She had pain all over. Hands, wrists, shoulders and elbows. Her body has ot been the same since. She stopped the steroids and feel it is ocming back. She had insurance issues. Dr. Estanislado Pandy declined her. She wants her pain addressed. I can address her muscle disease but not if she has RA. She declines any treatment. She is crying today. I offered to tret her, but also discussed that she may have other autoimmune diseases like RA and I do not treat that and cannot treat her joint pain.   Reviewed notes, labs and imaging from outside physicians, which showed: see above  Review of Systems: Patient complains of symptoms per HPI as well as the following symptoms joint pain. Pertinent negatives and positives per HPI. All others negative.   Social History   Socioeconomic History  . Marital status: Single    Spouse name: Not on file  . Number of children: 1  . Years of education: Not on file  . Highest education level: Master's degree (e.g., MA, MS, MEng, MEd, MSW, MBA)  Occupational History  . Occupation: Hydrologist.  Tobacco Use  . Smoking status: Former Smoker    Packs/day: 1.50    Years: 17.00    Pack years: 25.50    Types: Cigarettes    Quit date: 07/10/2000    Years since quitting: 19.9  . Smokeless tobacco: Never Used  Vaping Use  . Vaping Use: Never used  Substance and Sexual Activity  . Alcohol use: Yes    Alcohol/week: 2.0 standard drinks    Types: 2 Cans of beer per week  . Drug  use: No    Comment: MJ when young  . Sexual activity: Not on file  Other Topics Concern  . Not on file  Social History Narrative   Lives alone, no support.Marland Kitchen   Has been hard during [place] pandemic.       Right handed   Caffeine: 1 cup coffee maybe twice a week   Social Determinants of Health   Financial Resource Strain: Not on file  Food Insecurity: Not on file  Transportation Needs: Not on file  Physical  Activity: Not on file  Stress: Not on file  Social Connections: Not on file  Intimate Partner Violence: Not on file    Family History  Problem Relation Age of Onset  . Heart disease Mother 37       AMI  . Diabetes Mother   . Hypertension Mother   . Hyperlipidemia Mother     Past Medical History:  Diagnosis Date  . Arthritis    knee  . COVID-19 06/2018   Residual joint pain  . Diabetes mellitus without complication (HCC)    Type 2, does not take her meds.  . Hypertension 2002  . Joint pain    since having COVID 06/2018  . Motion sickness    boats, car - back seat  . Prediabetes 2016    Patient Active Problem List   Diagnosis Date Noted  . 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 Polyarthralgia 03/16/2020  . Type 2 diabetes mellitus without complication ([place]) 03/88/8280  . 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 Thyromegaly 09/09/2018  . Prediabetes   . Hypertension 04/23/2000    Past Surgical History:  Procedure Laterality Date  . ABDOMINAL HYSTERECTOMY  2016   unilateral oophorectomy.  Does not know what side.  For fibroids and heavy bleeding.  . APPENDECTOMY     age 62  . CATARACT EXTRACTION W/PHACO Right 01/28/2020   Procedure: CATARACT EXTRACTION PHACO AND INTRAOCULAR LENS PLACEMENT (IOC) RIGHT VISION BLUE 13.73  01:10.8;  Surgeon: Marchia Meiers, MD;  Location: [facility];  Service: Ophthalmology;  Laterality: Right;  Diabetes Latex  . CATARACT EXTRACTION W/PHACO Left 06/21/2020   Procedure: CATARACT EXTRACTION PHACO AND INTRAOCULAR LENS PLACEMENT ([place]) LEFT;  Surgeon: Birder Robson, MD;  Location: [facility];  Service: Ophthalmology;  Laterality: Left;  18.17 1:22.4  . MUSCLE BIOPSY Left 03/28/2020   Procedure: LEFT THIGH MUSCLE BIOPSY;  Surgeon: Felicie Morn, MD;  Location: [facility];  Service: General;  Laterality: Left;  . TONSILLECTOMY      Current Outpatient Medications  Medication Sig Dispense Refill  . glipiZIDE (GLUCOTROL) 10 MG  tablet Take 1 tablet (10 mg total) by mouth 2 (two) times daily before a meal. 60 tablet 3  . losartan (COZAAR) 25 MG tablet Take 1 tablet (25 mg total) by mouth daily. 30 tablet 3  . metoprolol tartrate (LOPRESSOR) 25 MG tablet Take 0.5 tablets (12.5 mg total) by mouth 2 (two) times daily. 60 tablet 3  . insulin isophane & regular human (NOVOLIN 70/30 FLEXPEN RELION) (70-30) 100 UNIT/ML KwikPen Inject 10 Units into the skin 2 (two) times daily. (Patient not taking: No sig reported) 15 mL 11  . Insulin Pen Needle 32G X 4 MM MISC 1 each by Does not apply route 4 (four) times daily -  before meals and at bedtime. (Patient not taking: Reported on 06/24/2020) 100 each 3  . insulin regular (NOVOLIN R) 100 units/mL injection Inject 0.01-0.09 mLs (1-9 Units total) into the skin 4 (four) times  daily -  before meals and at bedtime. Glucose 121 - 150: 1 unit, Glucose 151 - 200: 2 units, Glucose 201 - 250: 3 units, Glucose 251 - 300: 5 units, Glucose 301 - 350: 7 units, Glucose 351 - 400: 9 units, Glucose > 400, call MD (Patient not taking: No sig reported) 10 mL 11  . predniSONE (DELTASONE) 20 MG tablet Take 2 tablets (40 mg total) by mouth daily with breakfast. (Patient not taking: No sig reported) 60 tablet 1   No current facility-administered medications for this visit.    Allergies as of 06/24/2020 - Review Complete 06/24/2020  Allergen Reaction Noted  . Penicillins Other (See Comments) 12/06/2017  . Aspirin Other (See Comments) 01/22/2020  . Garlic Swelling 75/91/6384  . Morphine and related Other (See Comments) 12/06/2017  . Gadavist [gadobutrol] Nausea And Vomiting 03/19/2020  . Latex Rash 01/25/2020    Vitals: BP (!) 147/85 (BP Location: Right Arm, Patient Position: Sitting)   Pulse 77   Ht '6\' 2"'  (1.88 m)   Wt 245 lb (111.1 kg)   BMI 31.46 kg/m  Last Weight:  Wt Readings from Last 1 Encounters:  06/24/20 245 lb (111.1 kg)   Last Height:   Ht Readings from Last 1 Encounters:  06/24/20 6'  2" (1.88 m)     Physical exam: Exam: Gen: NAD, conversant, well nourised, obese, well groomed                     CV: RRR, no MRG. No Carotid Bruits. No peripheral edema, warm, nontender Eyes: Conjunctivae clear without exudates or hemorrhage  Neuro: Detailed Neurologic Exam  Speech:    Speech is normal; fluent and spontaneous with normal comprehension.  Cognition:    The patient is oriented to person, place, and time;     recent and remote memory intact;     language fluent;     normal attention, concentration,     fund of knowledge Cranial Nerves:    The pupils are equal, round, and reactive to light. The fundi are normal and spontaneous venous pulsations are present. Visual fields are full to finger confrontation. Extraocular movements are intact. Trigeminal sensation is intact and the muscles of mastication are normal. The face is symmetric. The palate elevates in the midline. Hearing intact. Voice is normal. Shoulder shrug is normal. The tongue has normal motion without fasciculations.   Coordination:    No dysmetria or ataxia  Gait:    Normal mative gait  Motor Observation:    No asymmetry, no atrophy, and no involuntary movements noted. Tone:    Normal muscle tone.    Posture:    Posture is normal. normal erect    Strength:    Proximal muscle weakness (4/5 throughout may be due to poor effort)     Sensation: intact to LT     Reflex Exam:  DTR's:    Deep tendon reflexes in the upper and lower extremities are symmetrical bilaterally.   Toes:    The toes are downgoing bilaterally.   Clonus:    Clonus is absent.   Assessment/Plan:  Patient who presented with diffuse joint pain muscle aches, weakness, upper back pain, extensive evaluation inpatinet included RPR HIV negative, CK slightly elevated 1219 improved to 360 after steroids, CT chest abdomen pelvis, skeletal muscle soft tissue stranding abnormal edema compatible with generalized myositis and thoracic  erector muscles, L5-S1 disc base narrowing and endplate spurring, no evidence of lumbar spine infection, disc and facet  degeneration, undergoing rheumatologic work-up with CRP and ESR not significantly elevated, RF was greater than 650, ANA negative, no monoclonal protein, she underwent bone marrow biopsy after being seen by hematology due to persistent leukocytosis and eosinophilia unclear diagnosis, status post muscle biopsy left thigh +inflammatory myopathy, started on steroid with clinical improvement  Patient has limited funds needs one physician to manage. I discussed I could manage her myositis however she may want to see Rheu,atology since her RF was > 650 and she may have other autoimmune disorders that need treatments. 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 I recommend methotrexate for myositis but unclear if this is the best to treat RA, if she has it. Refer to Dr. Kathlene November.   NO CHARGE appointment today since sh emay need to be under the care of a Rheumatologist.Texted Dr. Kathlene November to get her in within a few weeks, she confirmed.   Orders Placed This Encounter  Procedures  . Ambulatory referral to Rheumatology   No orders of the defined types were placed in this encounter.   Cc: Lorenza Chick, MD,  Mack Hook, MD  Sarina Ill, MD  [facility] [address] [place], [place] [address]  Phone [phone] Fax [phone]

## 2020-06-24 ENCOUNTER — Encounter: Payer: Self-pay | Admitting: Neurology

## 2020-06-24 ENCOUNTER — Ambulatory Visit (INDEPENDENT_AMBULATORY_CARE_PROVIDER_SITE_OTHER): Payer: Self-pay | Admitting: Neurology

## 2020-06-24 VITALS — BP 147/85 | HR 77 | Ht 74.0 in | Wt 245.0 lb

## 2020-06-24 DIAGNOSIS — R768 Other specified abnormal immunological findings in serum: Secondary | ICD-10-CM

## 2020-06-24 DIAGNOSIS — M3390 Dermatopolymyositis, unspecified, organ involvement unspecified: Secondary | ICD-10-CM

## 2020-06-24 NOTE — Patient Instructions (Addendum)
I would recommend restating steroids and also starting Methotrexate Alternatively going to Rheumatolgy may be your best option   [employer]edon's Skin Pathology (5th ed., pp. 4, [phone]). , [place]: [employer]."> Practical gastroenterology and hepatology board review toolkit (pp. [phone]). [place], [place]: [employer].">  Dermatomyositis Dermatomyositis is a rare muscle disease that causes muscle weakness and a rash. It usually affects muscles that are closest to the trunk of your body (torso). You may have a combination of muscle weakness, pain, and swelling. This condition can make it hard to rise from a sitting position, climb stairs, lift objects, or reach over your head. What are the causes? The cause of this condition is not known. What increases the risk? You are more likely to develop this condition if:  You are female.  You have cancer.  You have lung disease. What are the signs or symptoms? Symptoms of this condition may include:  A bluish-purple rash that may be itchy. This often develops before the muscle weakness. It appears on the face, neck, shoulders, upper chest, elbows, knees, knuckles, and back.  Swelling of the face and eyelids.  Hardened bumps of calcium deposits under your skin (especially in children).  Trouble swallowing, talking, and breathing.  Muscle aches and tenderness.  Fatigue.  Weight loss.  Low fever.  Open wounds (ulcers).  Heartburn from changes in muscles of the esophagus.  Muscle weakness, swelling, and pain. How is this diagnosed? This condition may be diagnosed based on:  Blood tests.  A test to check whether your muscles are responding properly to electrical signals from your nerves (electromyogram).  Removal of a small tissue sample from a weakened muscle to be examined under a microscope (muscle biopsy).  Removal of a small skin sample from an area with a rash to be examined under a microscope (skin biopsy).  Chest  X-ray.  MRI. How is this treated? This condition may be treated with:  Medicines that weaken and decrease the activity of your body's disease-fighting system (immune system) and reduce inflammation (corticosteroids).  Medicine containing antibodies to help your body fight infections (immunoglobulin).  Medicines to reduce inflammation in your body.  Physical therapy and exercise to prevent muscle loss (atrophy). Follow these instructions at home:  Wear protective clothing and high-protection sunscreen when you go outside. Areas that are affected by a rash are more sensitive to the sun.  Use skin creams or ointments as told by your health care provider.  Take over-the-counter and prescription medicines only as told by your health care provider.  If physical therapy was prescribed, do exercises as directed.  Do not eat right before bedtime. Raise (elevate) the head of your bed to decrease heartburn.  If directed by your health care provider, eat more protein in your diet. Contact a health care provider if:  Your condition gets worse.  You have unexplained muscle weakness.  You develop a new rash.  You have a fever and cough. Get help right away if:  You have difficulty swallowing.  You develop breathing problems. Summary  Dermatomyositis is a rare muscle disease that causes muscle weakness and a rash.  You may have a combination of muscle weakness, pain, and swelling.  Treatment may include medicines and physical therapy.  Wear protective clothing and high-protection sunscreen when you go outside. Areas that are affected by a rash are more sensitive to the sun. This information is not intended to replace advice given to you by your health care provider. Make sure you  discuss any questions you have with your health care provider. Document Revised: 01/01/2020 Document Reviewed: 01/01/2020 Elsevier Patient Education  2021 [employer] Inc.  Methotrexate tablets What is  this medicine? METHOTREXATE (METH oh TREX ate) is a chemotherapy drug used to treat cancer including breast cancer, leukemia, and lymphoma. This medicine can also be used to treat psoriasis and certain kinds of arthritis. This medicine may be used for other purposes; ask your health care provider or pharmacist if you have questions. COMMON BRAND NAME(S): Rheumatrex, Trexall What should I tell my health care provider before I take this medicine? They need to know if you have any of these conditions:  fluid in the stomach area or lungs  if you often drink alcohol  infection or immune system problems  kidney disease or on hemodialysis  liver disease  low blood counts, like low white cell, platelet, or red cell counts  lung disease  radiation therapy  stomach ulcers  ulcerative colitis  an unusual or allergic reaction to methotrexate, other medicines, foods, dyes, or preservatives  pregnant or trying to get pregnant  breast-feeding How should I use this medicine? Take this medicine by mouth with a glass of water. Follow the directions on the prescription label. Take your medicine at regular intervals. Do not take it more often than directed. Do not stop taking except on your doctor's advice. Make sure you know why you are taking this medicine and how often you should take it. If this medicine is used for a condition that is not cancer, like arthritis or psoriasis, it should be taken weekly, NOT daily. Taking this medicine more often than directed can cause serious side effects, even death. Talk to your healthcare provider about safe handling and disposal of this medicine. You may need to take special precautions. Talk to your pediatrician regarding the use of this medicine in children. While this drug may be prescribed for selected conditions, precautions do apply. Overdosage: If you think you have taken too much of this medicine contact a poison control center or emergency room at  once. NOTE: This medicine is only for you. Do not share this medicine with others. What if I miss a dose? If you miss a dose, talk with your doctor or health care professional. Do not take double or extra doses. What may interact with this medicine? Do not take this medicine with any of the following medications:  acitretin This medicine may also interact with the following medication:  aspirin and aspirin-like medicines including salicylates  azathioprine  certain antibiotics like penicillins, tetracycline, and chloramphenicol  certain medicines that treat or prevent blood clots like warfarin, apixaban, dabigatran, and rivaroxaban  certain medicines for stomach problems like esomeprazole, omeprazole, pantoprazole  cyclosporine  dapsone  diuretics  gold  hydroxychloroquine  live virus vaccines  medicines for infection like acyclovir, adefovir, amphotericin B, bacitracin, cidofovir, foscarnet, ganciclovir, gentamicin, pentamidine, vancomycin  mercaptopurine  NSAIDs, medicines for pain and inflammation, like ibuprofen or naproxen  other cytotoxic agents  pamidronate  pemetrexed  penicillamine  phenylbutazone  phenytoin  probenecid  pyrimethamine  retinoids such as isotretinoin and tretinoin  steroid medicines like prednisone or cortisone  sulfonamides like sulfasalazine and trimethoprim/sulfamethoxazole  theophylline  zoledronic acid This list may not describe all possible interactions. Give your health care provider a list of all the medicines, herbs, non-prescription drugs, or dietary supplements you use. Also tell them if you smoke, drink alcohol, or use illegal drugs. Some items may interact with your medicine. What should  I watch for while using this medicine? Avoid alcoholic drinks. This medicine can make you more sensitive to the sun. Keep out of the sun. If you cannot avoid being in the sun, wear protective clothing and use sunscreen. Do not use  sun lamps or tanning beds/booths. You may need blood work done while you are taking this medicine. Call your doctor or health care professional for advice if you get a fever, chills or sore throat, or other symptoms of a cold or flu. Do not treat yourself. This drug decreases your body's ability to fight infections. Try to avoid being around people who are sick. This medicine may increase your risk to bruise or bleed. Call your doctor or health care professional if you notice any unusual bleeding. Be careful brushing or flossing your teeth or using a toothpick because you may get an infection or bleed more easily. If you have any dental work done, tell your dentist you are receiving this medicine. Check with your doctor or health care professional if you get an attack of severe diarrhea, nausea and vomiting, or if you sweat a lot. The loss of too much body fluid can make it dangerous for you to take this medicine. Talk to your doctor about your risk of cancer. You may be more at risk for certain types of cancers if you take this medicine. Do not become pregnant while taking this medicine or for 6 months after stopping it. Women should inform their health care provider if they wish to become pregnant or think they might be pregnant. Men should not father a child while taking this medicine and for 3 months after stopping it. There is potential for serious harm to an unborn child. Talk to your health care provider for more information. Do not breast-feed an infant while taking this medicine or for 1 week after stopping it. This medicine may make it more difficult to get pregnant or father a child. Talk to your health care provider if you are concerned about your fertility. What side effects may I notice from receiving this medicine? Side effects that you should report to your doctor or health care professional as soon as possible:  allergic reactions like skin rash, itching or hives, swelling of the face,  lips, or tongue  breathing problems or shortness of breath  diarrhea  dry, nonproductive cough  low blood counts - this medicine may decrease the number of white blood cells, red blood cells and platelets. You may be at increased risk for infections and bleeding.  mouth sores  redness, blistering, peeling or loosening of the skin, including inside the mouth  signs of infection - fever or chills, cough, sore throat, pain or trouble passing urine  signs and symptoms of bleeding such as bloody or black, tarry stools; red or dark-brown urine; spitting up blood or brown material that looks like coffee grounds; red spots on the skin; unusual bruising or bleeding from the eye, gums, or nose  signs and symptoms of kidney injury like trouble passing urine or change in the amount of urine  signs and symptoms of liver injury like dark yellow or brown urine; general ill feeling or flu-like symptoms; light-colored stools; loss of appetite; nausea; right upper belly pain; unusually weak or tired; yellowing of the eyes or skin Side effects that usually do not require medical attention (report to your doctor or health care professional if they continue or are bothersome):  dizziness  hair loss  tiredness  upset stomach  vomiting This list may not describe all possible side effects. Call your doctor for medical advice about side effects. You may report side effects to FDA at 1-800-FDA-1088. Where should I keep my medicine? Keep out of the reach of children and pets. Store at room temperature between 20 and 25 degrees C (68 and 77 degrees F). Protect from light. Get rid of any unused medicine after the expiration date. Talk to your health care provider about how to dispose of unused medicine. Special directions may apply. NOTE: This sheet is a summary. It may not cover all possible information. If you have questions about this medicine, talk to your doctor, pharmacist, or health care provider.   2021 Elsevier/Gold Standard (2019-11-09 10:40:39) Prednisone tablets What is this medicine? PREDNISONE (PRED ni sone) is a corticosteroid. It is commonly used to treat inflammation of the skin, joints, lungs, and other organs. Common conditions treated include asthma, allergies, and arthritis. It is also used for other conditions, such as blood disorders and diseases of the adrenal glands. This medicine may be used for other purposes; ask your health care provider or pharmacist if you have questions. COMMON BRAND NAME(S): Deltasone, Predone, Sterapred, Sterapred DS What should I tell my health care provider before I take this medicine? They need to know if you have any of these conditions:  Cushing's syndrome  diabetes  glaucoma  heart disease  high blood pressure  infection (especially a virus infection such as chickenpox, cold sores, or herpes)  kidney disease  liver disease  mental illness  myasthenia gravis  osteoporosis  seizures  stomach or intestine problems  thyroid disease  an unusual or allergic reaction to lactose, prednisone, other medicines, foods, dyes, or preservatives  pregnant or trying to get pregnant  breast-feeding How should I use this medicine? Take this medicine by mouth with a glass of water. Follow the directions on the prescription label. Take this medicine with food. If you are taking this medicine once a day, take it in the morning. Do not take more medicine than you are told to take. Do not suddenly stop taking your medicine because you may develop a severe reaction. Your doctor will tell you how much medicine to take. If your doctor wants you to stop the medicine, the dose may be slowly lowered over time to avoid any side effects. Talk to your pediatrician regarding the use of this medicine in children. Special care may be needed. Overdosage: If you think you have taken too much of this medicine contact a poison control center or emergency  room at once. NOTE: This medicine is only for you. Do not share this medicine with others. What if I miss a dose? If you miss a dose, take it as soon as you can. If it is almost time for your next dose, talk to your doctor or health care professional. You may need to miss a dose or take an extra dose. Do not take double or extra doses without advice. What may interact with this medicine? Do not take this medicine with any of the following medications:  metyrapone  mifepristone This medicine may also interact with the following medications:  aminoglutethimide  amphotericin B  aspirin and aspirin-like medicines  barbiturates  certain medicines for diabetes, like glipizide or glyburide  cholestyramine  cholinesterase inhibitors  cyclosporine  digoxin  diuretics  ephedrine  female hormones, like estrogens and birth control pills  isoniazid  ketoconazole  NSAIDS, medicines for pain and inflammation, like ibuprofen or naproxen  phenytoin  rifampin  toxoids  vaccines  warfarin This list may not describe all possible interactions. Give your health care provider a list of all the medicines, herbs, non-prescription drugs, or dietary supplements you use. Also tell them if you smoke, drink alcohol, or use illegal drugs. Some items may interact with your medicine. What should I watch for while using this medicine? Visit your doctor or health care professional for regular checks on your progress. If you are taking this medicine over a prolonged period, carry an identification card with your name and address, the type and dose of your medicine, and your doctor's name and address. This medicine may increase your risk of getting an infection. Tell your doctor or health care professional if you are around anyone with measles or chickenpox, or if you develop sores or blisters that do not heal properly. If you are going to have surgery, tell your doctor or health care professional  that you have taken this medicine within the last twelve months. Ask your doctor or health care professional about your diet. You may need to lower the amount of salt you eat. This medicine may increase blood sugar. Ask your healthcare provider if changes in diet or medicines are needed if you have diabetes. What side effects may I notice from receiving this medicine? Side effects that you should report to your doctor or health care professional as soon as possible:  allergic reactions like skin rash, itching or hives, swelling of the face, lips, or tongue  changes in emotions or moods  changes in vision  depressed mood  eye pain  fever or chills, cough, sore throat, pain or difficulty passing urine  signs and symptoms of high blood sugar such as being more thirsty or hungry or having to urinate more than normal. You may also feel very tired or have blurry vision.  swelling of ankles, feet Side effects that usually do not require medical attention (report to your doctor or health care professional if they continue or are bothersome):  confusion, excitement, restlessness  headache  nausea, vomiting  skin problems, acne, thin and shiny skin  trouble sleeping  weight gain This list may not describe all possible side effects. Call your doctor for medical advice about side effects. You may report side effects to FDA at 1-800-FDA-1088. Where should I keep my medicine? Keep out of the reach of children. Store at room temperature between 15 and 30 degrees C (59 and 86 degrees F). Protect from light. Keep container tightly closed. Throw away any unused medicine after the expiration date. NOTE: This sheet is a summary. It may not cover all possible information. If you have questions about this medicine, talk to your doctor, pharmacist, or health care provider.  2021 Elsevier/Gold Standard (2018-01-07 10:54:22)   Rheumatoid Arthritis Rheumatoid arthritis (RA) is a long-term (chronic)  disease. RA causes inflammation in your joints. Your joints may feel painful, stiff, swollen, and warm. RA may start slowly. It most often affects the small joints of the hands and feet. It can also affect other parts of the body. Symptoms of RA often come and go. There is no cure for RA, but medicines can help your symptoms. What are the causes?  RA is an autoimmune disease. This means that your body's defense system (immune system) attacks healthy parts of your body by mistake. The exact cause of RA is not known. What increases the risk?  Being a woman.  Having a family history of RA or other  diseases like RA.  Smoking.  Being overweight.  Being exposed to pollutants or chemicals. What are the signs or symptoms?  Morning stiffness that lasts longer than 30 minutes. This is often the first symptom.  Symptoms start slowly. They are often worse in the morning.  As RA gets worse, symptoms may include: ? Pain, stiffness, swelling, warmth, and tenderness in joints on both sides of your body. ? Loss of energy. ? Not feeling hungry. ? Weight loss. ? A low fever. ? Dry eyes and a dry mouth. ? Firm lumps that grow under your skin. ? Changes in the way your joints look. ? Changes in the way your joints work.  Symptoms vary and they: ? Often come and go. ? Sometimes get worse for a period of time. These are called flares. How is this treated?  Treatment may include: ? Taking good care of yourself. Be sure to rest as needed, eat a healthy diet, and exercise. ? Medicines. These may include:  Pain relievers.  Medicines to help with inflammation.  Disease-modifying antirheumatic drugs (DMARDs).  Medicines called biologic response modifiers. ? Physical therapy and occupational therapy. ? Surgery, if joint damage is very bad. Your doctor will work with you to find the best treatments.   Follow these instructions at home: Activity  Return to your normal activities as told by  your doctor. Ask your doctor what activities are safe for you.  Rest when you have a flare.  Exercise as told by your doctor. General instructions  Take over-the-counter and prescription medicines only as told by your doctor.  Keep all follow-up visits as told by your doctor. This is important. Where to find more information  [employer]: www.rheumatology.org  Arthritis Foundation: www.arthritis.org Contact a doctor if:  You have a flare.  You have a fever.  You have problems because of your medicines. Get help right away if:  You have chest pain.  You have trouble breathing.  You get a hot, painful joint all of a sudden, and it is worse than your normal joint aches. Summary  RA is a long-term disease.  Symptoms of RA start slowly. They are often worse in the morning.  RA causes inflammation in your joints. This information is not intended to replace advice given to you by your health care provider. Make sure you discuss any questions you have with your health care provider. Document Revised: 12/11/2017 Document Reviewed: 12/11/2017 Elsevier Patient Education  2021 [employer].

## 2020-09-30 ENCOUNTER — Ambulatory Visit: Payer: Medicaid Other | Admitting: Internal Medicine

## 2021-08-03 ENCOUNTER — Ambulatory Visit: Payer: 59 | Admitting: Podiatry

## 2021-08-03 ENCOUNTER — Encounter: Payer: Self-pay | Admitting: Podiatry

## 2021-08-03 ENCOUNTER — Ambulatory Visit (INDEPENDENT_AMBULATORY_CARE_PROVIDER_SITE_OTHER): Payer: 59

## 2021-08-03 ENCOUNTER — Other Ambulatory Visit: Payer: Self-pay | Admitting: Podiatry

## 2021-08-03 DIAGNOSIS — L03119 Cellulitis of unspecified part of limb: Secondary | ICD-10-CM | POA: Diagnosis not present

## 2021-08-03 DIAGNOSIS — S9032XA Contusion of left foot, initial encounter: Secondary | ICD-10-CM | POA: Diagnosis not present

## 2021-08-03 DIAGNOSIS — L02619 Cutaneous abscess of unspecified foot: Secondary | ICD-10-CM

## 2021-08-03 DIAGNOSIS — M79671 Pain in right foot: Secondary | ICD-10-CM

## 2021-08-03 DIAGNOSIS — L97512 Non-pressure chronic ulcer of other part of right foot with fat layer exposed: Secondary | ICD-10-CM | POA: Diagnosis not present

## 2021-08-03 MED ORDER — SULFAMETHOXAZOLE-TRIMETHOPRIM 800-160 MG PO TABS: 1.0000 | ORAL_TABLET | Freq: Two times a day (BID) | ORAL | 0 refills | Status: DC

## 2021-08-03 MED ORDER — SILVER SULFADIAZINE 1 % EX CREA: 1.0000 "application " | TOPICAL_CREAM | Freq: Every day | CUTANEOUS | 0 refills | Status: DC

## 2021-08-04 ENCOUNTER — Telehealth: Payer: Self-pay | Admitting: *Deleted

## 2021-08-04 LAB — HEMOGLOBIN A1C
Est. average glucose Bld gHb Est-mCnc: 352 mg/dL
Hgb A1c MFr Bld: 13.9 % — ABNORMAL HIGH (ref 4.8–5.6)

## 2021-08-04 LAB — CBC WITH DIFFERENTIAL
Basophils Absolute: 0 10*3/uL (ref 0.0–0.2)
Basos: 1 %
EOS (ABSOLUTE): 0.6 10*3/uL — ABNORMAL HIGH (ref 0.0–0.4)
Eos: 7 %
Hematocrit: 40.7 % (ref 34.0–46.6)
Hemoglobin: 13.5 g/dL (ref 11.1–15.9)
Immature Grans (Abs): 0 10*3/uL (ref 0.0–0.1)
Immature Granulocytes: 0 %
Lymphocytes Absolute: 1.1 10*3/uL (ref 0.7–3.1)
Lymphs: 13 %
MCH: 29.4 pg (ref 26.6–33.0)
MCHC: 33.2 g/dL (ref 31.5–35.7)
MCV: 89 fL (ref 79–97)
Monocytes Absolute: 0.7 10*3/uL (ref 0.1–0.9)
Monocytes: 8 %
Neutrophils Absolute: 6.3 10*3/uL (ref 1.4–7.0)
Neutrophils: 71 %
RBC: 4.59 x10E6/uL (ref 3.77–5.28)
RDW: 14.1 % (ref 11.7–15.4)
WBC: 8.8 10*3/uL (ref 3.4–10.8)

## 2021-08-04 LAB — COMPREHENSIVE METABOLIC PANEL
ALT: 14 IU/L (ref 0–32)
AST: 14 IU/L (ref 0–40)
Albumin/Globulin Ratio: 1.8 (ref 1.2–2.2)
Albumin: 4.4 g/dL (ref 3.8–4.9)
Alkaline Phosphatase: 144 IU/L — ABNORMAL HIGH (ref 44–121)
BUN/Creatinine Ratio: 19 (ref 9–23)
BUN: 24 mg/dL (ref 6–24)
Bilirubin Total: 1 mg/dL (ref 0.0–1.2)
CO2: 23 mmol/L (ref 20–29)
Calcium: 9.3 mg/dL (ref 8.7–10.2)
Chloride: 95 mmol/L — ABNORMAL LOW (ref 96–106)
Creatinine, Ser: 1.24 mg/dL — ABNORMAL HIGH (ref 0.57–1.00)
Globulin, Total: 2.5 g/dL (ref 1.5–4.5)
Glucose: 556 mg/dL (ref 70–99)
Potassium: 5.1 mmol/L (ref 3.5–5.2)
Sodium: 132 mmol/L — ABNORMAL LOW (ref 134–144)
Total Protein: 6.9 g/dL (ref 6.0–8.5)
eGFR: 53 mL/min/{1.73_m2} — ABNORMAL LOW (ref >59)

## 2021-08-04 LAB — PREALBUMIN: PREALBUMIN: 24 mg/dL (ref 10–36)

## 2021-08-04 LAB — SEDIMENTATION RATE: Sed Rate: 6 mm/hr (ref 0–40)

## 2021-08-04 LAB — C-REACTIVE PROTEIN: CRP: 33 mg/L — ABNORMAL HIGH (ref 0–10)

## 2021-08-04 NOTE — Telephone Encounter (Signed)
Called patient to give urgent message(go to ER immediately)  from Dr Jacques NavyWagoner,no answer, could not leave a message.  ?Called daughter asking her to call our office for an urgent message for mother, no answer but was able to leave a message to contact our office asap for urgent message for her mother. ?

## 2021-08-04 NOTE — Progress Notes (Signed)
Subjective:  ? ?Patient ID: Kaitlin Holloway, female   DOB: 51 y.o.   MRN: [id number]  ? ?HPI ?51 year old female presents the office today with concerns of a possible bruise on the bottom of her right big toe.  She has noticed that this may have started after she was stepping on a trash can.  She denies any drainage or pus.  She first noticed this about 2-1/2 weeks ago.  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  She also has secondary concerns of pain to her left foot.  She has been experiencing some discomfort in the top of her foot for the last month.  She has had no recent treatment for this.  This started after she hit her foot on the bed frame. ? ?States her last A1c was 9.7 on March 16, 2020.  She has established with a primary care provider but she has not yet seen them.  Unfortunately she states that she did not have insurance and that is why she did not seek treatment earlier for foot or to see a primary care doctor. ? ?Review of Systems  ?All other systems reviewed and are negative. ? ?Past Medical History:  ?Diagnosis Date  ? Arthritis   ? knee  ? COVID-19 06/2018  ? Residual joint pain  ? Diabetes mellitus without complication (HCC)   ? Type 2, does not take her meds.  ? Hypertension 2002  ? Joint pain   ? since having COVID 06/2018  ? Motion sickness   ? boats, car - back seat  ? Prediabetes 2016  ? ? ?Past Surgical History:  ?Procedure Laterality Date  ? ABDOMINAL HYSTERECTOMY  2016  ? unilateral oophorectomy.  Does not know what side.  For fibroids and heavy bleeding.  ? APPENDECTOMY    ? age 75  ? CATARACT EXTRACTION W/PHACO Right 01/28/2020  ? Procedure: CATARACT EXTRACTION PHACO AND INTRAOCULAR LENS PLACEMENT (IOC) RIGHT VISION BLUE 13.73  01:10.8;  Surgeon: Elliot Cousin, MD;  Location: [facility];  Service: Ophthalmology;  Laterality: Right;  Diabetes ?Latex  ? CATARACT EXTRACTION W/PHACO Left 06/21/2020  ? Procedure: CATARACT EXTRACTION PHACO AND INTRAOCULAR LENS PLACEMENT (IOC) LEFT;   Surgeon: Galen Manila, MD;  Location: [facility];  Service: Ophthalmology;  Laterality: Left;  18.17 ?1:22.4  ? MUSCLE BIOPSY Left 03/28/2020  ? Procedure: LEFT THIGH MUSCLE BIOPSY;  Surgeon: Quentin Ore, MD;  Location: [facility];  Service: General;  Laterality: Left;  ? TONSILLECTOMY    ? ? ? ?Current Outpatient Medications:  ?  silver sulfADIAZINE (SILVADENE) 1 % cream, Apply 1 application. topically daily., Disp: 50 g, Rfl: 0 ?  sulfamethoxazole-trimethoprim (BACTRIM DS) 800-160 MG tablet, Take 1 tablet by mouth 2 (two) times daily., Disp: 20 tablet, Rfl: 0 ?  glipiZIDE (GLUCOTROL) 10 MG tablet, Take 1 tablet (10 mg total) by mouth 2 (two) times daily before a meal., Disp: 60 tablet, Rfl: 3 ?  insulin isophane & regular human (NOVOLIN 70/30 FLEXPEN RELION) (70-30) 100 UNIT/ML KwikPen, Inject 10 Units into the skin 2 (two) times daily. (Patient not taking: No sig reported), Disp: 15 mL, Rfl: 11 ?  Insulin Pen Needle 32G X 4 MM MISC, 1 each by Does not apply route 4 (four) times daily -  before meals and at bedtime. (Patient not taking: Reported on 06/24/2020), Disp: 100 each, Rfl: 3 ?  insulin regular (NOVOLIN R) 100 units/mL injection, Inject 0.01-0.09 mLs (1-9 Units total) into the skin 4 (  four) times daily -  before meals and at bedtime. Glucose 121 - 150: 1 unit, Glucose 151 - 200: 2 units, Glucose 201 - 250: 3 units, Glucose 251 - 300: 5 units, Glucose 301 - 350: 7 units, Glucose 351 - 400: 9 units, Glucose > 400, call MD (Patient not taking: No sig reported), Disp: 10 mL, Rfl: 11 ?  losartan (COZAAR) 25 MG tablet, Take 1 tablet (25 mg total) by mouth daily., Disp: 30 tablet, Rfl: 3 ?  metoprolol tartrate (LOPRESSOR) 25 MG tablet, Take 0.5 tablets (12.5 mg total) by mouth 2 (two) times daily., Disp: 60 tablet, Rfl: 3 ?  predniSONE (DELTASONE) 20 MG tablet, Take 2 tablets (40 mg total) by mouth daily with breakfast. (Patient not taking: No sig reported), Disp: 60 tablet, Rfl: 1 ? ?Allergies   ?Allergen Reactions  ? Penicillins Other (See Comments)  ?  Has patient had a PCN reaction causing immediate rash, facial/tongue/throat swelling, SOB or lightheadedness with hypotension: Yes ?Has patient had a PCN reaction causing severe rash involving mucus membranes or skin necrosis: No ?Has patient had a PCN reaction that required hospitalization: No ?Has patient had a PCN reaction occurring within the last 10 years: No ?If all of the above answers are "NO", then may proceed with Cephalosporin use. ?  ? Aspirin Other (See Comments)  ?  Muscle spasms in back  ? Garlic Swelling  ? Morphine And Related Other (See Comments)  ?  Headaches/ migraine  ? Gadavist [Gadobutrol] Nausea And Vomiting  ?  Nausea and vomiting immediately after contrast injection   ? Latex Rash  ?  Gloves - when worn  ? ? ? ? ? ?   ?Objective:  ?Physical Exam  ?General: AAO x3, NAD ? ?Dermatological: On the plantar aspect of the right hallux there is a hyperkeratotic lesion with dried blood.  Small and superficial purulence noted which was cultured.  Upon debridement there is a granular wound noted.  There is edema and erythema with mild streaking to the dorsal aspect of the foot but does not go above the ankle.  There is no fluctuation or crepitation.  There is no malodor.  No open lesions otherwise.  After debridement the ulcer measured 1 x 0.7 x 0.3 cm. ? ? ? ? ? ? ?Vascular: Dorsalis Pedis artery and Posterior Tibial artery pedal pulses are palpable bilateral with immedate capillary fill time. There is no pain with calf compression, swelling, warmth, erythema.  ? ?Neruologic: Sensation decreased with SWMF.  ? ?Musculoskeletal: No significant pain on the right foot ulceration.  There is mild discomfort in the dorsal aspect of the left foot but there is no specific area pinpoint tenderness.  No significant edema to the left foot.  Flexor, extensor tendons appear to be intact.  Muscular strength 5/5 in all groups tested bilateral. ? ?Gait:  Unassisted, Nonantalgic.  ? ? ?   ?Assessment:  ? ?51 year old female with right foot ulceration, cellulitis; left foot injury ? ?   ?Plan:  ?-Treatment options discussed including all alternatives, risks, and complications ?-Etiology of symptoms were discussed ?-X-rays were obtained and reviewed with the patient.  3 views of bilateral feet were obtained and reviewed.  No definitive cortical destruction suggestive of osteomyelitis at this time.  No soft tissue edema.  On the left foot there is no evidence of acute fracture. ?-I sharply debrided the wound of the left foot to remove the nonviable devitalized tissue in order to promote wound healing.  There was  some amount of purulence noted which I did take a wound culture of today.  I debrided the wound down to healthy, granular tissue.  This is to the level of the subcutaneous tissue.  Post wound measurements are above.  The wound was covered with callus prior to debridement.  I cleansed the wound with saline.  Silvadene was applied followed by dressing. ?-Prescribed Silvadene.  Daily dressing changes as well as Bactrim. ?-Surgical shoe for offloading ?-Discussed glucose control. ?-Blood work ordered including CBC, BMP, sed rate, CRP ?-ABI ordered  ?-For the left foot ice, elevation.  Wearing stiffer soled shoes. ? ?*Of note received critical lab value for sugar of 556.  The on-call provider tried to reach the patient but was unable to do so and sent a MyChart message.  I followed up on this on 08/04/2021 and we were able to reach the patient and advised her to the emergency department given her blood work as well as the infection in her foot.  She verbalized understanding. ? ?Return in about 1 week (around 08/10/2021). ? ?Vivi BarrackMatthew R Morrigan Wickens DPM ? ?   ? ?

## 2021-08-04 NOTE — Telephone Encounter (Signed)
Called patient,2nd time, did reach her and instructed her per Dr Jacqualyn Posey to go to ER to get her blood sugar levels under control, verbalized understanding, said that she was aware of the high levels(556). ?

## 2021-08-07 ENCOUNTER — Telehealth: Payer: Self-pay | Admitting: *Deleted

## 2021-08-07 NOTE — Telephone Encounter (Signed)
"  We received a requisition with no account number on it. I think it may belong to you.  I wasn't sure because there's three different account for [facility].  Give [place] a call back.  Hit Client Services and give them the reference # [id number] W." ?

## 2021-08-09 ENCOUNTER — Telehealth: Payer: Self-pay | Admitting: *Deleted

## 2021-08-09 LAB — WOUND CULTURE
MICRO NUMBER:: 13259931
SPECIMEN QUALITY:: ADEQUATE

## 2021-08-09 LAB — HOUSE ACCOUNT TRACKING

## 2021-08-09 NOTE — Telephone Encounter (Signed)
Kaitlin Holloway w/ [facility] is calling for an requisition  on 08/03/21 with no account # ([id number]-accession #) that was received, Aerobic culture w/ gram stain. ?Called Lattie Haw  @[email or web address]  to inform that we did have a wound culture sent out on the 13 th , gave acct. #, verbalized understanding. ?

## 2021-08-11 NOTE — Telephone Encounter (Signed)
[facility] was called on April 19,with account number, documented in chart.

## 2021-08-11 NOTE — Telephone Encounter (Signed)
This was taken care of on April 19th, [facility] has been contacted.

## 2021-08-15 ENCOUNTER — Encounter: Payer: Self-pay | Admitting: Internal Medicine

## 2021-08-15 ENCOUNTER — Ambulatory Visit: Payer: BC Managed Care – PPO | Attending: Internal Medicine | Admitting: Internal Medicine

## 2021-08-15 ENCOUNTER — Encounter: Payer: Self-pay | Admitting: Podiatry

## 2021-08-15 ENCOUNTER — Ambulatory Visit: Payer: 59 | Admitting: Podiatry

## 2021-08-15 VITALS — BP 126/81 | HR 76 | Temp 98.1°F | Resp 16 | Ht 74.0 in | Wt 225.0 lb

## 2021-08-15 DIAGNOSIS — E1159 Type 2 diabetes mellitus with other circulatory complications: Secondary | ICD-10-CM

## 2021-08-15 DIAGNOSIS — F320 Major depressive disorder, single episode, mild: Secondary | ICD-10-CM | POA: Diagnosis not present

## 2021-08-15 DIAGNOSIS — E11621 Type 2 diabetes mellitus with foot ulcer: Secondary | ICD-10-CM

## 2021-08-15 DIAGNOSIS — L97519 Non-pressure chronic ulcer of other part of right foot with unspecified severity: Secondary | ICD-10-CM

## 2021-08-15 DIAGNOSIS — L02619 Cutaneous abscess of unspecified foot: Secondary | ICD-10-CM

## 2021-08-15 DIAGNOSIS — N289 Disorder of kidney and ureter, unspecified: Secondary | ICD-10-CM

## 2021-08-15 DIAGNOSIS — E1142 Type 2 diabetes mellitus with diabetic polyneuropathy: Secondary | ICD-10-CM

## 2021-08-15 DIAGNOSIS — L97512 Non-pressure chronic ulcer of other part of right foot with fat layer exposed: Secondary | ICD-10-CM | POA: Diagnosis not present

## 2021-08-15 DIAGNOSIS — F419 Anxiety disorder, unspecified: Secondary | ICD-10-CM

## 2021-08-15 DIAGNOSIS — I152 Hypertension secondary to endocrine disorders: Secondary | ICD-10-CM

## 2021-08-15 DIAGNOSIS — L03119 Cellulitis of unspecified part of limb: Secondary | ICD-10-CM

## 2021-08-15 DIAGNOSIS — Z7689 Persons encountering health services in other specified circumstances: Secondary | ICD-10-CM

## 2021-08-15 DIAGNOSIS — M069 Rheumatoid arthritis, unspecified: Secondary | ICD-10-CM

## 2021-08-15 MED ORDER — LEVEMIR FLEXPEN 100 UNIT/ML ~~LOC~~ SOPN: 14.0000 [IU] | PEN_INJECTOR | Freq: Every day | SUBCUTANEOUS | 11 refills | Status: DC

## 2021-08-15 MED ORDER — INSULIN LISPRO (1 UNIT DIAL) 100 UNIT/ML (KWIKPEN): 5.0000 [IU] | PEN_INJECTOR | Freq: Three times a day (TID) | SUBCUTANEOUS | 11 refills | Status: DC

## 2021-08-15 MED ORDER — LOSARTAN POTASSIUM 25 MG PO TABS: 25.0000 mg | ORAL_TABLET | Freq: Every day | ORAL | 4 refills | Status: DC

## 2021-08-15 NOTE — Progress Notes (Signed)
Subjective: ?50 year old female presents the office today for follow-up evaluation of wound, infection of her right big toe.  She completed a course of antibiotics yesterday.  I did do blood work and her sugar was significantly elevated and she had somewhat decreased kidney function.  Given the infection as well as her sugar I recommended her to the emergency room.  She did not go.  She did establish care with a primary care doctor and she has an appointment later today to see her.  She states that she was seen another doctor for another issue and was put on prednisone which is increased her sugar.  She states that she was told she was prediabetic previously was on medication and since then her sugars continue to increase.  She does not check her blood sugar at home but she states that she does have a meter as well as test trips. ? ? ?Objective: ?AAO x3, NAD ?DP/PT pulses palpable bilaterally, CRT less than 3 seconds ?On the plantar aspect of the right hallux is a hyperkeratotic lesion with central granular wound.  After debridement of the wound today it is smaller 0.6 x 0.3 x 0.2 cm without any probing, undermining or tunneling.  There is no surrounding erythema, ascending cellulitis.  No drainage or pus.  No fluctuation or crepitation but there is no malodor.  Prior to wound debridement the wound was almost completely covered with callus and it measured about 0.3 x 0.3 x 0.1 cm. ?No pain with calf compression, swelling, warmth, erythema ? ? ? ?Assessment: ?Ulceration right hallux with improvement; uncontrolled type 2 diabetes ? ?Plan: ?-All treatment options discussed with the patient including all alternatives, risks, complications.  ?-Sharply debrided the wound today utilizing #312 with scalpel to any complications and healthy, bleeding, granular tissue in order to promote wound healing.  This is through the subcutaneous tissue. There is minimal blood loss and hemostasis sheath or manual compression.  Areas  cleaned with saline.  Silvadene was applied followed by dressing.  Recommend continue with daily dressing changes, offloading.  Sensitive close monitoring of any signs or symptoms of recurrence of infection call the office or go to the emergency room should any occur. ?-Discussed importance of glucose control.  She is to follow-up with a new primary care doctor later today.  Also will need to have kidney function rechecked. ?-Patient encouraged to call the office with any questions, concerns, change in symptoms.  ? ?Vivi Barrack DPM ? ? ?
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
## 2021-08-15 NOTE — Patient Instructions (Signed)
Diabetes Mellitus and Standards of Medical Care ?Living with and managing diabetes (diabetes mellitus) can be complicated. Your diabetes treatment may be managed by a team of health care providers, including: ?A physician who specializes in diabetes (endocrinologist). You might also have visits with a nurse practitioner or physician assistant. ?Nurses. ?A registered dietitian. ?A certified diabetes care and education specialist. ?An exercise specialist. ?A pharmacist. ?An eye doctor. ?A foot specialist (podiatrist). ?A dental care provider. ?A primary care provider. ?A mental health care provider. ?How to manage your diabetes ?You can do many things to successfully manage your diabetes. Your health care providers will follow guidelines to help you get the best quality of care. Here are general guidelines for your diabetes management plan. Your health care providers may give you more specific instructions. ?Physical exams ?When you are diagnosed with diabetes, and each year after that, your health care provider will ask about your medical and family history. You will have a physical exam, which may include: ?Measuring your height, weight, and body mass index (BMI). ?Checking your blood pressure. This will be done at every routine medical visit. Your target blood pressure may vary depending on your medical conditions, your age, and other factors. ?A thyroid exam. ?A skin exam. ?Screening for nerve damage (peripheral neuropathy). This may include checking the pulse in your legs and feet and the level of sensation in your hands and feet. ?A foot exam to inspect the structure and skin of your feet, including checking for cuts, bruises, redness, blisters, sores, or other problems. ?Screening for blood vessel (vascular) problems. This may include checking the pulse in your legs and feet and checking your temperature. ?Blood tests ?Depending on your treatment plan and your personal needs, you may have the following  tests: ?Hemoglobin A1C (HbA1C). This test provides information about blood sugar (glucose) control over the previous 2-3 months. It is used to adjust your treatment plan, if needed. This test will be done: ?At least 2 times a year, if you are meeting your treatment goals. ?4 times a year, if you are not meeting your treatment goals or if your goals have changed. ?Lipid testing, including total cholesterol, LDL and HDL cholesterol, and triglyceride levels. ?The goal for LDL is less than 100 mg/dL (5.5 mmol/L). If you are at high risk for complications, the goal is less than 70 mg/dL (3.9 mmol/L). ?The goal for HDL is 40 mg/dL (2.2 mmol/L) or higher for men, and 50 mg/dL (2.8 mmol/L) or higher for women. An HDL cholesterol of 60 mg/dL (3.3 mmol/L) or higher gives some protection against heart disease. ?The goal for triglycerides is less than 150 mg/dL (8.3 mmol/L). ?Liver function tests. ?Kidney function tests. ?Thyroid function tests. ? ?Dental and eye exams ? ?Visit your dentist two times a year. ?If you have type 1 diabetes, your health care provider may recommend an eye exam within 5 years after you are diagnosed, and then once a year after your first exam. ?For children with type 1 diabetes, the health care provider may recommend an eye exam when your child is age 17 or older and has had diabetes for 3-5 years. After the first exam, your child should get an eye exam once a year. ?If you have type 2 diabetes, your health care provider may recommend an eye exam as soon as you are diagnosed, and then every 1-2 years after your first exam. ?Immunizations ?A yearly flu (influenza) vaccine is recommended annually for everyone 6 months or older. This is especially  important if you have diabetes. ?The pneumonia (pneumococcal) vaccine is recommended for everyone 2 years or older who has diabetes. If you are age 20 or older, you may get the pneumonia vaccine as a series of two separate shots. ?The hepatitis B vaccine is  recommended for adults shortly after being diagnosed with diabetes. ?Adults and children with diabetes should receive all other vaccines according to age-specific recommendations from the Centers for Disease Control and Prevention (CDC). ?Mental and emotional health ?Screening for symptoms of eating disorders, anxiety, and depression is recommended at the time of diagnosis and after as needed. If your screening shows that you have symptoms, you may need more evaluation. You may work with a mental health care provider. ?Follow these instructions at home: ?Treatment plan ?You will monitor your blood glucose levels and may give yourself insulin. Your treatment plan will be reviewed at every medical visit. You and your health care provider will discuss: ?How you are taking your medicines, including insulin. ?Any side effects you have. ?Your blood glucose level target goals. ?How often you monitor your blood glucose level. ?Lifestyle habits, such as activity level and tobacco, alcohol, and substance use. ?Education ?Your health care provider will assess how well you are monitoring your blood glucose levels and whether you are taking your insulin and medicines correctly. He or she may refer you to: ?A certified diabetes care and education specialist to manage your diabetes throughout your life, starting at diagnosis. ?A registered dietitian who can create and review your personal nutrition plan. ?An exercise specialist who can discuss your activity level and exercise plan. ?General instructions ?Take over-the-counter and prescription medicines only as told by your health care provider. ?Keep all follow-up visits. This is important. ?Where to find support ?There are many diabetes support networks, including: ?American Diabetes Association (ADA): diabetes.org ?Defeat Diabetes Foundation: defeatdiabetes.org ?Where to find more information ?American Diabetes Association (ADA): www.diabetes.org ?Association of Diabetes Care &  Education Specialists (ADCES): diabeteseducator.org ?International Diabetes Federation (IDF): http://hill.biz/ ?Summary ?Managing diabetes (diabetes mellitus) can be complicated. Your diabetes treatment may be managed by a team of health care providers. ?Your health care providers follow guidelines to help you get the best quality care. ?You should have physical exams, blood tests, blood pressure monitoring, immunizations, and screening tests regularly. Stay updated on how to manage your diabetes. ?Your health care providers may also give you more specific instructions based on your individual health. ?This information is not intended to replace advice given to you by your health care provider. Make sure you discuss any questions you have with your health care provider. ?Document Revised: 10/15/2019 Document Reviewed: 10/15/2019 ?Elsevier Patient Education ? 2023 Elsevier Inc. ? ?Diabetes Mellitus and Nutrition, Adult ?When you have diabetes, or diabetes mellitus, it is very important to have healthy eating habits because your blood sugar (glucose) levels are greatly affected by what you eat and drink. Eating healthy foods in the right amounts, at about the same times every day, can help you: ?Manage your blood glucose. ?Lower your risk of heart disease. ?Improve your blood pressure. ?Reach or maintain a healthy weight. ?What can affect my meal plan? ?Every person with diabetes is different, and each person has different needs for a meal plan. Your health care provider may recommend that you work with a dietitian to make a meal plan that is best for you. Your meal plan may vary depending on factors such as: ?The calories you need. ?The medicines you take. ?Your weight. ?Your blood glucose, blood pressure,  and cholesterol levels. ?Your activity level. ?Other health conditions you have, such as heart or kidney disease. ?How do carbohydrates affect me? ?Carbohydrates, also called carbs, affect your blood glucose level more than  any other type of food. Eating carbs raises the amount of glucose in your blood. ?It is important to know how many carbs you can safely have in each meal. This is different for every person. Your dietitian can

## 2021-08-15 NOTE — Progress Notes (Signed)
Needs medication refills. Out of medication x 4 days .  ? ?Left eye and ear discomfort causing swelling ?

## 2021-08-16 ENCOUNTER — Telehealth: Payer: Self-pay | Admitting: Internal Medicine

## 2021-08-16 ENCOUNTER — Telehealth: Payer: Self-pay

## 2021-08-16 LAB — BASIC METABOLIC PANEL
BUN/Creatinine Ratio: 21 (ref 9–23)
BUN: 21 mg/dL (ref 6–24)
CO2: 26 mmol/L (ref 20–29)
Calcium: 9.9 mg/dL (ref 8.7–10.2)
Chloride: 101 mmol/L (ref 96–106)
Creatinine, Ser: 1.01 mg/dL — ABNORMAL HIGH (ref 0.57–1.00)
Glucose: 377 mg/dL — ABNORMAL HIGH (ref 70–99)
Potassium: 4.7 mmol/L (ref 3.5–5.2)
Sodium: 138 mmol/L (ref 134–144)
eGFR: 67 mL/min/{1.73_m2} (ref >59)

## 2021-08-16 LAB — LIPID PANEL
Chol/HDL Ratio: 3.5 ratio (ref 0.0–4.4)
Cholesterol, Total: 187 mg/dL (ref 100–199)
HDL: 54 mg/dL (ref >39)
LDL Chol Calc (NIH): 112 mg/dL — ABNORMAL HIGH (ref 0–99)
Triglycerides: 120 mg/dL (ref 0–149)
VLDL Cholesterol Cal: 21 mg/dL (ref 5–40)

## 2021-08-16 LAB — MICROALBUMIN / CREATININE URINE RATIO
Creatinine, Urine: 53.9 mg/dL
Microalb/Creat Ratio: 132 mg/g creat — ABNORMAL HIGH (ref 0–29)
Microalbumin, Urine: 71.4 ug/mL

## 2021-08-16 NOTE — Telephone Encounter (Signed)
Medication Refill - Medication: Metoprolol Tartrate 25 MG Tablet  ? ?Has the patient contacted their pharmacy? Yes.   ?(Agent: If no, request that the patient contact the pharmacy for the refill. If patient does not wish to contact the pharmacy document the reason why and proceed with request.) ?(Agent: If yes, when and what did the pharmacy advise?) ? ?Preferred Pharmacy (with phone number or street name):  ?[id number] - [place] ([place]), [place] - [address]  ?[address] [place] ([place]) [place] [id number]  ?Phone: [phone] Fax: [phone]  ? ?Has the patient been seen for an appointment in the last year OR does the patient have an upcoming appointment? Yes.   ? ?Agent: Please be advised that RX refills may take up to 3 business days. We ask that you follow-up with your pharmacy. ? ?

## 2021-08-16 NOTE — Telephone Encounter (Signed)
Duplicate message. PCP has been sent a message regarding medication. ?

## 2021-08-16 NOTE — Telephone Encounter (Signed)
Copied from [place] #[id number]. Topic: General - Other ?>> Aug 15, 2021  4:12 PM Pawlus, Brayton Layman A wrote: ?Reason for CRM: Pt was just seen today 4/25 and stated Losartan was supposed to be sent into [facility] ([place]), [place] - [address], medication pt is requesting is not listed on medication list, pt was unaware of the dosage either, please advise. ?

## 2021-08-16 NOTE — Telephone Encounter (Signed)
Contacted pt to go over lab results. Pt states she is not willing to start the Atorvastatin. Pt states she wants to work on her cholesterol herself. Pt doesn't have any other concerns  ?

## 2021-08-16 NOTE — Telephone Encounter (Signed)
Patient states that Levimir insulin is 60.00 and that is too much for her. Her Humalog is not covered by her insurance. She needs another option. ?

## 2021-08-16 NOTE — Telephone Encounter (Signed)
Pt called to report that the Levemir is too expensive and that the Humalog is not covered by her insurance please advise patient is requesting a more affordable alternative.  ? ?insulin detemir (LEVEMIR FLEXPEN) 100 UNIT/ML FlexPen (CANNOT AFFORD) ?insulin lispro (HUMALOG KWIKPEN) 100 UNIT/ML KwikPen (NOT COVERED BY INSURANCE) ? ? ?[place] - [address]  ?[address] [place] ([place]) [place] [address]  ?Phone: [phone] Fax: [phone]  ? ?

## 2021-08-16 NOTE — Telephone Encounter (Signed)
Patient called to discuss refill request not on medication list, no answer, no voicemail set up.  ?

## 2021-08-16 NOTE — Telephone Encounter (Signed)
Phone call placed to patient this evening. ?Inform patient that I received her message regarding the insulin.  She has [employer].  I recommend that she call them first thing in the morning and find out what long-acting and short acting insulin are preferred on formulary for them.  I gave her the names of 3 long-acting insulins to inquire about including Lantus, glargine and Semglee and NovoLog for the short acting insulin.  I request that she call them tomorrow morning to find out this information and then give me a call back with the preferred long-acting and short acting insulin for the [employer] insurance.  Once I receive that information I will send the prescriptions to the pharmacy.  Patient expressed understanding and states she will follow-up with calling me back tomorrow when she is spoken to her insurance. ?

## 2021-08-17 ENCOUNTER — Telehealth: Payer: Self-pay | Admitting: Internal Medicine

## 2021-08-17 MED ORDER — INSULIN GLARGINE-YFGN 100 UNIT/ML ~~LOC~~ SOPN: 14.0000 [IU] | PEN_INJECTOR | Freq: Every day | SUBCUTANEOUS | 2 refills | Status: DC

## 2021-08-17 MED ORDER — NOVOLOG FLEXPEN 100 UNIT/ML ~~LOC~~ SOPN: 5.0000 [IU] | PEN_INJECTOR | Freq: Three times a day (TID) | SUBCUTANEOUS | 2 refills | Status: DC

## 2021-08-17 NOTE — Telephone Encounter (Signed)
Patient Rx was sent in on 08/15/2021 to [facility].  ?Per Pharmacy Tech, patient pick up rx on yesterday.  ?

## 2021-08-17 NOTE — Telephone Encounter (Signed)
Semglee and Novolog are preferred alternatives to currently prescribed Levemir and Humalog per patient's insurance. Rxs sent for the [facility] and Novolog with same instructions. ?

## 2021-08-17 NOTE — Telephone Encounter (Signed)
Pt reports that she contacted her insurance and was advised of the following long acting Tier 3 Drugs on the plan: Semglee and Glargine and to please attach YFGN to the Rx request. Pt also stated she was advised that a short acting medication is Novolog and the drug formulary is on [employer] website. ?

## 2021-08-17 NOTE — Telephone Encounter (Signed)
Unable to reach to inform: insulins were sent to pharmacy. ? ?Voicemail not set up.   ?

## 2021-08-22 ENCOUNTER — Ambulatory Visit: Payer: BC Managed Care – PPO

## 2021-08-25 ENCOUNTER — Ambulatory Visit: Payer: 59 | Attending: Internal Medicine

## 2021-08-25 DIAGNOSIS — Z111 Encounter for screening for respiratory tuberculosis: Secondary | ICD-10-CM

## 2021-08-25 NOTE — Progress Notes (Signed)
Pt arrived for PPD test ?PPD placed in left arm. ?Pt to return on Monday for reading. ?

## 2021-08-28 LAB — TB SKIN TEST
Induration: 0 mm
TB Skin Test: NEGATIVE

## 2021-08-30 ENCOUNTER — Encounter: Payer: Self-pay | Admitting: Internal Medicine

## 2021-08-30 NOTE — Progress Notes (Signed)
Received note from patient's rheumatologist Dr. Deanne Coffer from the visit 08/23/2021. ?Labs: BUN 21, creatinine 0.96, GFR 79. ?Glucose 430 ?H/H13.4/37.6 ?Diagnoses are myositis, rheumatoid arthritis involving multiple sites with positive rheumatoid factor, OA spine and dermatomyositis. ?

## 2021-09-13 ENCOUNTER — Encounter (HOSPITAL_COMMUNITY): Payer: 59

## 2021-09-15 ENCOUNTER — Ambulatory Visit: Payer: BC Managed Care – PPO | Admitting: Pharmacist

## 2021-09-21 ENCOUNTER — Ambulatory Visit: Payer: 59 | Admitting: Podiatry

## 2021-09-21 ENCOUNTER — Encounter: Payer: Self-pay | Admitting: Podiatry

## 2021-09-21 DIAGNOSIS — L97512 Non-pressure chronic ulcer of other part of right foot with fat layer exposed: Secondary | ICD-10-CM

## 2021-09-23 NOTE — Progress Notes (Signed)
Subjective: 51 year old female presents the office today for follow-up evaluation of wound, infection of her right big toe.  She states that she is not able to stay off her foot completely given she has to take care of herself.  She also states that she has new shoes but she not able to afford it at this time.  She does work standing 4 to 6 hours a day.  She does consume occasional bleeding but no pus.  No fevers or chills that she reports.  No other concerns.  Objective: AAO x3, NAD DP/PT pulses palpable bilaterally, CRT less than 3 seconds On the plantar aspect of the right hallux is a hyperkeratotic lesion with dried blood underneath it.  See plan below but she has not made to trim the majority of the callus.  I did trim some of it and there is no underlying drainage or pus or signs of abscess.  So ago that showed a small granular wound centrally.  There does not appear to be any probing, amount or tunneling.  No fluctuation or crepitation.  No malodor.  No pain with calf compression, swelling, warmth, erythema  Assessment: Ulceration right hallux; uncontrolled type 2 diabetes  Plan: -All treatment options discussed with the patient including all alternatives, risks, complications.  -I lightly debrided the wound, hyperkeratotic tissue to make sure there is no underlying ulceration.  Initially she did not we did trim any of the dead tissue off.  She did allow me to trim a small amount is only which there is no purulence or drainage which there was not.  We will do Betadine dressing changes for now.  Encouraged offloading.  We will order a foot defender boot for her.  I am also going to see if we get diabetic shoes or inserts covered through her insurance.  This would help prevent further ulceration as well as facilitate wound healing now.  Return in about 3 weeks (around 10/12/2021).  Vivi Barrack DPM

## 2021-09-26 ENCOUNTER — Ambulatory Visit: Payer: BC Managed Care – PPO | Admitting: Internal Medicine

## 2021-10-10 ENCOUNTER — Ambulatory Visit: Payer: 59 | Admitting: Podiatry

## 2021-10-31 ENCOUNTER — Encounter: Payer: Self-pay | Admitting: Podiatry

## 2021-11-07 ENCOUNTER — Ambulatory Visit: Payer: 59 | Admitting: Podiatry

## 2021-11-07 ENCOUNTER — Ambulatory Visit (INDEPENDENT_AMBULATORY_CARE_PROVIDER_SITE_OTHER): Payer: 59

## 2021-11-07 DIAGNOSIS — S90222A Contusion of left lesser toe(s) with damage to nail, initial encounter: Secondary | ICD-10-CM

## 2021-11-07 DIAGNOSIS — L03119 Cellulitis of unspecified part of limb: Secondary | ICD-10-CM | POA: Diagnosis not present

## 2021-11-07 DIAGNOSIS — L97512 Non-pressure chronic ulcer of other part of right foot with fat layer exposed: Secondary | ICD-10-CM

## 2021-11-07 DIAGNOSIS — L02619 Cutaneous abscess of unspecified foot: Secondary | ICD-10-CM | POA: Diagnosis not present

## 2021-11-07 MED ORDER — DOXYCYCLINE HYCLATE 100 MG PO TABS: 100.0000 mg | ORAL_TABLET | Freq: Two times a day (BID) | ORAL | 0 refills | Status: DC

## 2021-11-07 MED ORDER — SILVER SULFADIAZINE 1 % EX CREA
1.0000 | TOPICAL_CREAM | Freq: Every day | CUTANEOUS | 0 refills | Status: DC
Start: 2021-11-07 — End: 2022-10-23

## 2021-11-07 NOTE — Progress Notes (Signed)
Subjective: Chief Complaint  Patient presents with   Diabetic Ulcer    Pt came in today for a ulcer on the right hallux, pt states the ulcer "is getting worse" the ulcer has an odor and is painful, pt is having sharp pain, Rate pain is 4 out of 10, X-Rays were taken today, pt also has a black nail on the left foot 2nd toe nail    51 year old female with above complaint.  She states since last appointment she stopped her routine of treating the ulcer. She also states she is in denial about the diabetes and has not been taking the diabetes medication.  She said the significant other diabetes medications and start this.  She has some dark discoloration of the left second toenail as well but no drainage or pus or any swelling.  No injuries that she reports.  No fevers, chills.   Objective: AAO x3, NAD DP/PT pulses palpable bilaterally, CRT less than 3 seconds On the plantar aspect of the right hallux is thick hyperkeratotic tissue.  As able to debride the majority this tissue there is 1 central granular wound measuring 0.4 x 0.3 x 0.1 cm without any probing to bone, and or tunneling.  It was appeared that there was a blister that had formed around this and is starting to dry, callused over.  There is minimal edema to the toe there is no erythema or warmth.  No ascending cellulitis. Left second digit nails hypertrophic, dystrophic and dark discoloration of the nail appears to be more of a subungual hematoma.  No signs to be hyperpigmentation in the surrounding skin.  No edema, erythema, drainage or pus. No pain with calf compression, swelling, warmth, erythema  Assessment: Right foot ulceration, left second digit toenail dystrophy  Plan: -All treatment options discussed with the patient including all alternatives, risks, complications.  -X-rays obtained reviewed of the right foot.  3 views were obtained.  No definitive cortical changes suggestive of osteomyelitis.  Bunions present. -Sharply  debrided the wound on the right foot utilizing #312 with scalpel without any complications and healthy, bleeding, granular tissue with nonviable, devitalized tissue in order to promote wound healing.  No significant bleeding.  I cleansed the wound with saline.  Silvadene was applied followed by dressing.  Encouraged offloading surgical shoe and I also encouraged compliance and glucose control. - -Left second toenail without any sign of infection.  Discussed that we will continue to monitor.  Should the dark discoloration spread will biopsy but appears to be more dried blood today. -Daily foot inspection -Monitor for any clinical signs or symptoms of infection and directed to call the office immediately should any occur or go to the ER. -Patient encouraged to call the office with any questions, concerns, change in symptoms.   Vivi Barrack DPM

## 2021-11-28 ENCOUNTER — Ambulatory Visit: Payer: 59 | Admitting: Podiatry

## 2021-11-28 DIAGNOSIS — E1165 Type 2 diabetes mellitus with hyperglycemia: Secondary | ICD-10-CM

## 2021-11-28 DIAGNOSIS — L97512 Non-pressure chronic ulcer of other part of right foot with fat layer exposed: Secondary | ICD-10-CM

## 2021-11-28 NOTE — Progress Notes (Signed)
Subjective: No chief complaint on file.  51 year old female presents the office today for evaluation of a wound on the bottom of her right big toe.  She thinks is getting better.  No drainage or pus.  No swelling or redness that she reports.  No fevers or chills.  Of note she did pick up her diabetes medication but she has not yet started taking this as she states that she is scared to take the medication.  She is hesitant to take medication.  Objective: AAO x3, NAD DP/PT pulses palpable bilaterally, CRT less than 3 seconds On the plantar aspect of the right hallux is thick hyperkeratotic tissue.  As able to debride the majority this tissue there is there is still central granular wound present without any probing to bone, undermining or tunneling.  No exposed bone or tendon.  The wound is somewhat smaller measuring 0.3 x 0.3 x 0.1 cm there is no fluctuation or crepitation.  There is no significant erythema or ascending cellulitis.  No malodor. No pain with calf compression, swelling, warmth, erythema  Assessment: Right foot ulceration, uncontrolled diabetes  Plan: -All treatment options discussed with the patient including all alternatives, risks, complications.  -Debrided the hyperkeratotic lesion, ulceration today utilizing #312 with scalpel to remove nonviable devitalized tissue in order to promote wound healing.  There was no blood loss.  I cleaned the wound with saline.  Small amount of Silvadene was applied followed by dressing.  Encouraged daily dressing changes as well. -I had a long discussion today with her in regards to the her diabetes and the importance of taking medication and compliance with this.  I offered different resources to help with this as well to make her feel more comfortable taking the medication but she was not open to it at this time.  I discussed with her seeing psychiatry and she states that this is already been discussed with her and she has not yet arranged  this. -Offloading -Monitor for any clinical signs or symptoms of infection and directed to call the office immediately should any occur or go to the ER.  Return in about 3 weeks (around 12/19/2021).Vivi Barrack DPM

## 2021-12-19 ENCOUNTER — Other Ambulatory Visit: Payer: Self-pay | Admitting: Podiatry

## 2021-12-19 ENCOUNTER — Ambulatory Visit (INDEPENDENT_AMBULATORY_CARE_PROVIDER_SITE_OTHER): Payer: Commercial Managed Care - HMO

## 2021-12-19 ENCOUNTER — Ambulatory Visit: Payer: 59 | Admitting: Podiatry

## 2021-12-19 DIAGNOSIS — L97512 Non-pressure chronic ulcer of other part of right foot with fat layer exposed: Secondary | ICD-10-CM

## 2021-12-19 DIAGNOSIS — M79671 Pain in right foot: Secondary | ICD-10-CM

## 2021-12-19 DIAGNOSIS — L03031 Cellulitis of right toe: Secondary | ICD-10-CM

## 2021-12-19 DIAGNOSIS — L02611 Cutaneous abscess of right foot: Secondary | ICD-10-CM | POA: Diagnosis not present

## 2021-12-19 MED ORDER — SULFAMETHOXAZOLE-TRIMETHOPRIM 800-160 MG PO TABS: 1.0000 | ORAL_TABLET | Freq: Two times a day (BID) | ORAL | 0 refills | Status: DC

## 2021-12-19 NOTE — Progress Notes (Signed)
Subjective: Chief Complaint  Patient presents with   Diabetic Ulcer    Patient denies nausea, vomiting, fever and chills. Patient has reported that she has a blister near the ulcer from the area rubbing against the shoe.   51 year old female presents the above concerns.  She states that she has been trying to exercise more and she thinks she wrapped the toe too tight, she has a blister on the side of the toe.  She denies any fevers or chills.   Objective: AAO x3, NAD DP/PT pulses palpable bilaterally, CRT less than 3 seconds On the plantar aspect of the right hallux is thick hyperkeratotic tissue.  The central aspect of the wound appears to be about the same measuring 0.3 x 0.3 x 0.1 cm however there is a new blister present extending to the medial aspect the toe and upon debridement there was purulence noted underneath this which I cultured.  There is localized edema and mild erythema to the hallux and there is no ascending cellulitis.  There is no crepitation. No pain with calf compression, swelling, warmth, erythema  Assessment: Right foot ulceration, infection, uncontrolled diabetes  Plan: -All treatment options discussed with the patient including all alternatives, risks, complications.  -X-rays obtained and reviewed.  No definitive evidence of acute osteomyelitis however in the oblique view there is 1 mild area of concern about but could be representation of early osteomyelitis. -Debrided the hyperkeratotic lesion, ulceration today utilizing #312 with scalpel to remove nonviable devitalized tissue in order to promote wound healing.  There was no blood loss.  I was also able to debride the blister and there was purulence noted I did take a culture of this today.  I cleaned the wound with saline.  Small amount of Betadine was applied followed by dressing.  Encouraged daily dressing changes as well. -Prescribed Bactrim.  Silvadene dressing changes daily. (Of note she is currently not taking  methotrexate because of the wound) -She needs to wear the surgical shoe.  She is wearing a slipper today. -Unfortunately high risk of toe loss given infection, uncontrolled diabetes -Monitor for any clinical signs or symptoms of infection and directed to call the office immediately should any occur or go to the ER.  Return in about 10 days (around 12/29/2021).  Repeat x-ray  Kaitlin Holloway DPM

## 2021-12-22 ENCOUNTER — Other Ambulatory Visit: Payer: Self-pay

## 2021-12-22 DIAGNOSIS — L97512 Non-pressure chronic ulcer of other part of right foot with fat layer exposed: Secondary | ICD-10-CM

## 2021-12-22 LAB — WOUND CULTURE
MICRO NUMBER:: 13847970
SPECIMEN QUALITY:: ADEQUATE

## 2021-12-22 LAB — HOUSE ACCOUNT TRACKING

## 2022-01-01 ENCOUNTER — Ambulatory Visit (INDEPENDENT_AMBULATORY_CARE_PROVIDER_SITE_OTHER): Payer: Commercial Managed Care - HMO | Admitting: Podiatry

## 2022-01-01 DIAGNOSIS — L97512 Non-pressure chronic ulcer of other part of right foot with fat layer exposed: Secondary | ICD-10-CM

## 2022-01-01 MED ORDER — CLINDAMYCIN HCL 300 MG PO CAPS: 300.0000 mg | ORAL_CAPSULE | Freq: Three times a day (TID) | ORAL | 0 refills | Status: DC

## 2022-01-01 NOTE — Progress Notes (Signed)
Subjective: Chief Complaint  Patient presents with   Diabetic Ulcer    Patient denies nausea, vomiting, fever and chills. Patient has reported that she has a blister near the ulcer from the area rubbing against the shoe.   51 year old female presents the above concerns.  She is concerned that the wound is not doing better.  Denies any drainage or pus or increasing swelling or redness.  Denies any fevers or chills.  Objective: AAO x3, NAD-wearing regular sandals DP/PT pulses palpable bilaterally, CRT less than 3 seconds On the plantar aspect of the right hallux is thick hyperkeratotic tissue.  On the area where there was a drainage/the plantar medial aspect this is all callused over and dried.  I was able to sharply debride the wound today there is granular wound present without any probing, no tunneling.  Overall it does look much better than it did last appointment.  Denies any fevers or chills. No pain with calf compression, swelling, warmth, erythema     Assessment: Right foot ulceration, infection, uncontrolled diabetes  Plan: -All treatment options discussed with the patient including all alternatives, risks, complications.  -Debrided the hyperkeratotic lesion, ulceration today utilizing #312 with scalpel to remove nonviable devitalized tissue in order to promote wound healing.  I was able to debride quite a bit of dried callus tissue off the medial aspect with a drainage present previously as well as around the wound.  Picture above revealing a granular wound present medial and central aspect.  No probing to bone or exposed bone or tendon.  No significant blood loss.  Hemostasis achieved through manual compression.  Silvadene was applied followed by dressing. -Prescribed clindamycin -We will order foot defender boot for her. -We will also refer to the wound care center. -Encouraged glucose control.  Return in about 3 weeks (around 01/22/2022) for ulcer right big toe.  X-ray next  appointment  Vivi Barrack DPM

## 2022-01-02 ENCOUNTER — Ambulatory Visit: Payer: Self-pay | Admitting: Internal Medicine

## 2022-01-18 ENCOUNTER — Other Ambulatory Visit: Payer: Self-pay | Admitting: Internal Medicine

## 2022-01-18 DIAGNOSIS — E1159 Type 2 diabetes mellitus with other circulatory complications: Secondary | ICD-10-CM

## 2022-01-18 NOTE — Telephone Encounter (Signed)
Requested Prescriptions  Pending Prescriptions Disp Refills  . losartan (COZAAR) 25 MG tablet [Pharmacy Med Name: Losartan Potassium 25 MG Oral Tablet] 90 tablet 0    Sig: Take 1 tablet by mouth once daily     Cardiovascular:  Angiotensin Receptor Blockers Failed - 01/18/2022  2:13 PM      Failed - Cr in normal range and within 180 days    Creatinine, Ser  Date Value Ref Range Status  08/15/2021 1.01 (H) 0.57 - 1.00 mg/dL Final         Passed - K in normal range and within 180 days    Potassium  Date Value Ref Range Status  08/15/2021 4.7 3.5 - 5.2 mmol/L Final         Passed - Patient is not pregnant      Passed - Last BP in normal range    BP Readings from Last 1 Encounters:  08/15/21 126/81         Passed - Valid encounter within last 6 months    Recent Outpatient Visits          5 months ago Establishing care with new doctor, encounter for   [place] Ladell Pier, MD

## 2022-01-30 ENCOUNTER — Ambulatory Visit: Payer: Commercial Managed Care - HMO | Admitting: Podiatry

## 2022-03-05 ENCOUNTER — Ambulatory Visit: Payer: Commercial Managed Care - HMO | Admitting: Podiatry

## 2022-03-05 ENCOUNTER — Ambulatory Visit (INDEPENDENT_AMBULATORY_CARE_PROVIDER_SITE_OTHER): Payer: Commercial Managed Care - HMO

## 2022-03-05 DIAGNOSIS — L02611 Cutaneous abscess of right foot: Secondary | ICD-10-CM

## 2022-03-05 DIAGNOSIS — E1165 Type 2 diabetes mellitus with hyperglycemia: Secondary | ICD-10-CM

## 2022-03-05 DIAGNOSIS — L03031 Cellulitis of right toe: Secondary | ICD-10-CM

## 2022-03-05 DIAGNOSIS — L97512 Non-pressure chronic ulcer of other part of right foot with fat layer exposed: Secondary | ICD-10-CM | POA: Diagnosis not present

## 2022-03-05 MED ORDER — DOXYCYCLINE HYCLATE 100 MG PO TABS: 100.0000 mg | ORAL_TABLET | Freq: Two times a day (BID) | ORAL | 0 refills | Status: DC

## 2022-03-06 ENCOUNTER — Ambulatory Visit (HOSPITAL_COMMUNITY): Payer: Commercial Managed Care - HMO | Attending: Podiatry

## 2022-03-08 NOTE — Progress Notes (Signed)
Subjective: Chief Complaint  Patient presents with   Diabetic Ulcer    Right foot hallux  ulcer, patient denies any pain, is having drainage and some bleeding, patient said that the wound is worse this visit     51 year old female presents the above concerns.  She states that she had a point where the wound was almost completely healed and the wound was looking very well however she states ulcerations opened back up.  She says some bloody drainage but no pus.  She has no pain when she has neuropathy.  Denies any fevers or chills.  No other concerns.   Objective: AAO x3, NAD-wearing regular sandals DP/PT pulses palpable bilaterally, CRT less than 3 seconds On the plantar aspect of the right hallux is thick hyperkeratotic tissue.  After debriding the wound it measures 1 x 1 x 0.5 cm.  Prior to debridement the wound was smaller at 0.8 x 0.8 x 0.3 cm.  Is close to the tendon, bone but not able to probe to bone or bone today.  Localized edema to the toe with faint erythema but no drainage or pus noted today.  There is no fluctuance or crepitation.  Slight malodor. No pain with calf compression, swelling, warmth, erythema   Assessment: Right foot ulceration, infection, uncontrolled diabetes  Plan: -All treatment options discussed with the patient including all alternatives, risks, complications.  -X-rays were obtained reviewed.  3 views of the right foot were obtained.  No definitive evidence of cortical destruction suggestive of osteomyelitis. -Still very concerned that the wound she is still at high risk of amputation of the toe.  We discussed this today as well. -Medically necessary wound debridement was performed.  Today I sharply debrided the hyperkeratotic lesion, ulceration today utilizing #312 with scalpel to remove nonviable devitalized tissue in order to promote wound healing.  I was able to debride quite a bit of dried callus tissue off the medial aspect with a drainage present previously  as well as around the wound.  Picture above revealing a granular wound present medial and central aspect.  No probing to bone or exposed bone or tendon.  No significant blood loss.  Hemostasis achieved through manual compression.  Silvadene was applied followed by dressing. -Prescribed doxycycline -We will follow-up on the foot if no improvement.  Her insurance was changing so she was not able to get it previously. -Previously referred to the wound care center-that they reach out to her but unable to schedule. -Encouraged glucose control.  Return in about 3 weeks (around 03/26/2022).  Vivi Barrack DPM

## 2022-03-13 ENCOUNTER — Other Ambulatory Visit: Payer: Self-pay | Admitting: Podiatry

## 2022-03-13 ENCOUNTER — Telehealth: Payer: Self-pay | Admitting: *Deleted

## 2022-03-13 DIAGNOSIS — L02611 Cutaneous abscess of right foot: Secondary | ICD-10-CM

## 2022-03-13 MED ORDER — DOXYCYCLINE HYCLATE 100 MG PO TABS: 100.0000 mg | ORAL_TABLET | Freq: Two times a day (BID) | ORAL | 0 refills | Status: DC

## 2022-03-13 MED ORDER — CIPROFLOXACIN HCL 500 MG PO TABS: 500.0000 mg | ORAL_TABLET | Freq: Two times a day (BID) | ORAL | 0 refills | Status: DC

## 2022-03-13 NOTE — Telephone Encounter (Signed)
Patient is needing a refill on the doxycycline due to only having one pill left.

## 2022-03-13 NOTE — Telephone Encounter (Signed)
Patient is calling because the antibiotic prescribed is not helping, toe seems worse, has formed a blister on side of toe(great) and is puffy, can a stronger antibiotic be sent in?

## 2022-03-18 ENCOUNTER — Encounter: Payer: Self-pay | Admitting: Emergency Medicine

## 2022-03-18 ENCOUNTER — Other Ambulatory Visit: Payer: Self-pay

## 2022-03-18 ENCOUNTER — Emergency Department: Payer: Commercial Managed Care - HMO

## 2022-03-18 ENCOUNTER — Inpatient Hospital Stay
Admission: EM | Admit: 2022-03-18 | Discharge: 2022-03-22 | DRG: 623 | Disposition: A | Discharge status: Home / Self Care | Payer: Commercial Managed Care - HMO | Attending: Internal Medicine | Admitting: Internal Medicine

## 2022-03-18 DIAGNOSIS — E871 Hypo-osmolality and hyponatremia: Secondary | ICD-10-CM | POA: Diagnosis present

## 2022-03-18 DIAGNOSIS — M869 Osteomyelitis, unspecified: Secondary | ICD-10-CM | POA: Diagnosis present

## 2022-03-18 DIAGNOSIS — E11628 Type 2 diabetes mellitus with other skin complications: Secondary | ICD-10-CM | POA: Diagnosis present

## 2022-03-18 DIAGNOSIS — I10 Essential (primary) hypertension: Secondary | ICD-10-CM | POA: Diagnosis present

## 2022-03-18 DIAGNOSIS — E1169 Type 2 diabetes mellitus with other specified complication: Secondary | ICD-10-CM | POA: Diagnosis present

## 2022-03-18 DIAGNOSIS — Z9102 Food additives allergy status: Secondary | ICD-10-CM | POA: Diagnosis not present

## 2022-03-18 DIAGNOSIS — M059 Rheumatoid arthritis with rheumatoid factor, unspecified: Secondary | ICD-10-CM | POA: Diagnosis not present

## 2022-03-18 DIAGNOSIS — M3313 Other dermatomyositis without myopathy: Secondary | ICD-10-CM | POA: Diagnosis present

## 2022-03-18 DIAGNOSIS — M8618 Other acute osteomyelitis, other site: Secondary | ICD-10-CM | POA: Diagnosis not present

## 2022-03-18 DIAGNOSIS — Z886 Allergy status to analgesic agent status: Secondary | ICD-10-CM

## 2022-03-18 DIAGNOSIS — L97519 Non-pressure chronic ulcer of other part of right foot with unspecified severity: Secondary | ICD-10-CM | POA: Diagnosis present

## 2022-03-18 DIAGNOSIS — Z794 Long term (current) use of insulin: Secondary | ICD-10-CM | POA: Diagnosis not present

## 2022-03-18 DIAGNOSIS — M069 Rheumatoid arthritis, unspecified: Secondary | ICD-10-CM | POA: Diagnosis present

## 2022-03-18 DIAGNOSIS — E11621 Type 2 diabetes mellitus with foot ulcer: Secondary | ICD-10-CM | POA: Diagnosis present

## 2022-03-18 DIAGNOSIS — Z87891 Personal history of nicotine dependence: Secondary | ICD-10-CM | POA: Diagnosis not present

## 2022-03-18 DIAGNOSIS — L03031 Cellulitis of right toe: Secondary | ICD-10-CM | POA: Diagnosis present

## 2022-03-18 DIAGNOSIS — Z880 Allergy status to penicillin: Secondary | ICD-10-CM

## 2022-03-18 DIAGNOSIS — E11649 Type 2 diabetes mellitus with hypoglycemia without coma: Secondary | ICD-10-CM | POA: Diagnosis not present

## 2022-03-18 DIAGNOSIS — Z91148 Patient's other noncompliance with medication regimen for other reason: Secondary | ICD-10-CM

## 2022-03-18 DIAGNOSIS — L089 Local infection of the skin and subcutaneous tissue, unspecified: Secondary | ICD-10-CM | POA: Diagnosis present

## 2022-03-18 DIAGNOSIS — M2550 Pain in unspecified joint: Secondary | ICD-10-CM | POA: Diagnosis present

## 2022-03-18 DIAGNOSIS — Z90710 Acquired absence of both cervix and uterus: Secondary | ICD-10-CM | POA: Diagnosis not present

## 2022-03-18 DIAGNOSIS — E1159 Type 2 diabetes mellitus with other circulatory complications: Secondary | ICD-10-CM | POA: Diagnosis present

## 2022-03-18 DIAGNOSIS — Z8249 Family history of ischemic heart disease and other diseases of the circulatory system: Secondary | ICD-10-CM

## 2022-03-18 DIAGNOSIS — Z8616 Personal history of COVID-19: Secondary | ICD-10-CM | POA: Diagnosis not present

## 2022-03-18 DIAGNOSIS — Z90721 Acquired absence of ovaries, unilateral: Secondary | ICD-10-CM

## 2022-03-18 DIAGNOSIS — E1165 Type 2 diabetes mellitus with hyperglycemia: Secondary | ICD-10-CM | POA: Diagnosis present

## 2022-03-18 DIAGNOSIS — Z833 Family history of diabetes mellitus: Secondary | ICD-10-CM

## 2022-03-18 DIAGNOSIS — E1142 Type 2 diabetes mellitus with diabetic polyneuropathy: Secondary | ICD-10-CM | POA: Diagnosis present

## 2022-03-18 DIAGNOSIS — Z79899 Other long term (current) drug therapy: Secondary | ICD-10-CM

## 2022-03-18 DIAGNOSIS — M86171 Other acute osteomyelitis, right ankle and foot: Secondary | ICD-10-CM | POA: Diagnosis not present

## 2022-03-18 DIAGNOSIS — Z91040 Latex allergy status: Secondary | ICD-10-CM | POA: Diagnosis not present

## 2022-03-18 DIAGNOSIS — Z9049 Acquired absence of other specified parts of digestive tract: Secondary | ICD-10-CM | POA: Diagnosis not present

## 2022-03-18 DIAGNOSIS — M3390 Dermatopolymyositis, unspecified, organ involvement unspecified: Secondary | ICD-10-CM | POA: Diagnosis not present

## 2022-03-18 LAB — COMPREHENSIVE METABOLIC PANEL
ALT: 24 U/L (ref 0–44)
AST: 20 U/L (ref 15–41)
Albumin: 3.2 g/dL — ABNORMAL LOW (ref 3.5–5.0)
Alkaline Phosphatase: 228 U/L — ABNORMAL HIGH (ref 38–126)
Anion gap: 6 (ref 5–15)
BUN: 33 mg/dL — ABNORMAL HIGH (ref 6–20)
CO2: 24 mmol/L (ref 22–32)
Calcium: 8.8 mg/dL — ABNORMAL LOW (ref 8.9–10.3)
Chloride: 100 mmol/L (ref 98–111)
Creatinine, Ser: 1 mg/dL (ref 0.44–1.00)
GFR, Estimated: >60 mL/min (ref >60)
Glucose, Bld: 634 mg/dL (ref 70–99)
Potassium: 4.2 mmol/L (ref 3.5–5.1)
Sodium: 130 mmol/L — ABNORMAL LOW (ref 135–145)
Total Bilirubin: 0.7 mg/dL (ref 0.3–1.2)
Total Protein: 6.8 g/dL (ref 6.5–8.1)

## 2022-03-18 LAB — CBC WITH DIFFERENTIAL/PLATELET
Abs Immature Granulocytes: 0.02 10*3/uL (ref 0.00–0.07)
Basophils Absolute: 0 10*3/uL (ref 0.0–0.1)
Basophils Relative: 1 %
Eosinophils Absolute: 0.5 10*3/uL (ref 0.0–0.5)
Eosinophils Relative: 8 %
HCT: 41.4 % (ref 36.0–46.0)
Hemoglobin: 14.9 g/dL (ref 12.0–15.0)
Immature Granulocytes: 0 %
Lymphocytes Relative: 20 %
Lymphs Abs: 1.4 10*3/uL (ref 0.7–4.0)
MCH: 29.3 pg (ref 26.0–34.0)
MCHC: 36 g/dL (ref 30.0–36.0)
MCV: 81.3 fL (ref 80.0–100.0)
Monocytes Absolute: 0.4 10*3/uL (ref 0.1–1.0)
Monocytes Relative: 6 %
Neutro Abs: 4.6 10*3/uL (ref 1.7–7.7)
Neutrophils Relative %: 65 %
Platelets: 187 10*3/uL (ref 150–400)
RBC: 5.09 MIL/uL (ref 3.87–5.11)
RDW: 11.6 % (ref 11.5–15.5)
WBC: 7 10*3/uL (ref 4.0–10.5)
nRBC: 0 % (ref 0.0–0.2)

## 2022-03-18 LAB — CBG MONITORING, ED
Glucose-Capillary: 393 mg/dL — ABNORMAL HIGH (ref 70–99)
Glucose-Capillary: 522 mg/dL (ref 70–99)

## 2022-03-18 LAB — SEDIMENTATION RATE: Sed Rate: 10 mm/hr (ref 0–30)

## 2022-03-18 LAB — GLUCOSE, CAPILLARY: Glucose-Capillary: 489 mg/dL — ABNORMAL HIGH (ref 70–99)

## 2022-03-18 LAB — BETA-HYDROXYBUTYRIC ACID: Beta-Hydroxybutyric Acid: 0.34 mmol/L — ABNORMAL HIGH (ref 0.05–0.27)

## 2022-03-18 MED ORDER — ACETAMINOPHEN 650 MG RE SUPP: 650.0000 mg | Freq: Four times a day (QID) | RECTAL | Status: DC | PRN

## 2022-03-18 MED ORDER — SODIUM CHLORIDE 0.9 % IV BOLUS
1000.0000 mL | Freq: Once | INTRAVENOUS | Status: AC
  Administered 2022-03-18: 1000 mL via INTRAVENOUS

## 2022-03-18 MED ORDER — ENOXAPARIN SODIUM 40 MG/0.4ML IJ SOSY
40.0000 mg | PREFILLED_SYRINGE | INTRAMUSCULAR | Status: DC
  Filled 2022-03-18 (×2): qty 0.4

## 2022-03-18 MED ORDER — SODIUM CHLORIDE 0.9 % IV SOLN: INTRAVENOUS | Status: AC

## 2022-03-18 MED ORDER — ONDANSETRON HCL 4 MG PO TABS: 4.0000 mg | ORAL_TABLET | Freq: Four times a day (QID) | ORAL | Status: DC | PRN

## 2022-03-18 MED ORDER — INSULIN ASPART 100 UNIT/ML IJ SOLN
0.0000 [IU] | Freq: Three times a day (TID) | INTRAMUSCULAR | Status: DC
  Administered 2022-03-18: 15 [IU] via SUBCUTANEOUS
  Administered 2022-03-19: 5 [IU] via SUBCUTANEOUS
  Administered 2022-03-19: 15 [IU] via SUBCUTANEOUS
  Filled 2022-03-18 (×3): qty 1

## 2022-03-18 MED ORDER — ONDANSETRON HCL 4 MG/2ML IJ SOLN: 4.0000 mg | Freq: Four times a day (QID) | INTRAMUSCULAR | Status: DC | PRN

## 2022-03-18 MED ORDER — SODIUM CHLORIDE 0.9 % IV SOLN
2.0000 g | Freq: Once | INTRAVENOUS | Status: AC
  Administered 2022-03-18: 2 g via INTRAVENOUS
  Filled 2022-03-18: qty 12.5

## 2022-03-18 MED ORDER — DOXYCYCLINE HYCLATE 100 MG PO TABS
100.0000 mg | ORAL_TABLET | Freq: Two times a day (BID) | ORAL | Status: DC
  Administered 2022-03-18 – 2022-03-19 (×2): 100 mg via ORAL
  Filled 2022-03-18 (×2): qty 1

## 2022-03-18 MED ORDER — METHOTREXATE SODIUM 2.5 MG PO TABS: 2.5000 mg | ORAL_TABLET | ORAL | Status: DC

## 2022-03-18 MED ORDER — ACETAMINOPHEN 325 MG PO TABS
650.0000 mg | ORAL_TABLET | Freq: Four times a day (QID) | ORAL | Status: DC | PRN
  Administered 2022-03-18 – 2022-03-21 (×6): 650 mg via ORAL
  Filled 2022-03-18 (×6): qty 2

## 2022-03-18 MED ORDER — INSULIN ASPART 100 UNIT/ML IJ SOLN
10.0000 [IU] | Freq: Once | INTRAMUSCULAR | Status: AC
  Administered 2022-03-18: 10 [IU] via INTRAVENOUS
  Filled 2022-03-18: qty 1

## 2022-03-18 MED ORDER — INSULIN GLARGINE-YFGN 100 UNIT/ML ~~LOC~~ SOLN
20.0000 [IU] | Freq: Every day | SUBCUTANEOUS | Status: DC
  Administered 2022-03-18 – 2022-03-21 (×4): 20 [IU] via SUBCUTANEOUS
  Filled 2022-03-18 (×4): qty 0.2

## 2022-03-18 MED ORDER — OXYCODONE HCL 5 MG PO TABS
5.0000 mg | ORAL_TABLET | Freq: Once | ORAL | Status: AC
  Administered 2022-03-18: 5 mg via ORAL
  Filled 2022-03-18: qty 1

## 2022-03-18 MED ORDER — FOLIC ACID 1 MG PO TABS
1.0000 mg | ORAL_TABLET | Freq: Every day | ORAL | Status: DC
  Administered 2022-03-19 – 2022-03-22 (×4): 1 mg via ORAL
  Filled 2022-03-18 (×4): qty 1

## 2022-03-18 MED ORDER — INSULIN GLARGINE-YFGN 100 UNIT/ML ~~LOC~~ SOPN
20.0000 [IU] | PEN_INJECTOR | Freq: Every day | SUBCUTANEOUS | Status: DC
  Filled 2022-03-18: qty 3

## 2022-03-18 MED ORDER — LOSARTAN POTASSIUM 25 MG PO TABS
25.0000 mg | ORAL_TABLET | Freq: Every day | ORAL | Status: DC
  Administered 2022-03-19 – 2022-03-22 (×4): 25 mg via ORAL
  Filled 2022-03-18 (×4): qty 1

## 2022-03-18 MED ORDER — VANCOMYCIN HCL IN DEXTROSE 1-5 GM/200ML-% IV SOLN
1000.0000 mg | Freq: Once | INTRAVENOUS | Status: AC
  Administered 2022-03-18: 1000 mg via INTRAVENOUS
  Filled 2022-03-18: qty 200

## 2022-03-18 NOTE — Assessment & Plan Note (Signed)
Patient has diabetes melitis and presents with significant hyperglycemia secondary to medication noncompliance Noted to have serum glucose of 634 with no evidence of metabolic acidosis Aggressive IV fluid resuscitation Start patient on Levemir 20 units daily and NovoLog sliding scale coverage Diabetic education

## 2022-03-18 NOTE — Assessment & Plan Note (Signed)
Continue losartan. 

## 2022-03-18 NOTE — Assessment & Plan Note (Signed)
Continue methotrexate and folic acid 

## 2022-03-18 NOTE — Assessment & Plan Note (Addendum)
Secondary to hyperglycemia Expect improvement in serum sodium levels following resolution of hyperglycemia.  Encourage oral hydration

## 2022-03-18 NOTE — H&P (Signed)
History and Physical    Patient: Kaitlin Holloway [id number] DOB: [birth year] DOA: 03/18/2022 DOS: the patient was seen and examined on 03/18/2022 PCP: Ladell Pier, MD  Patient coming from: Home  Chief Complaint:  Chief Complaint  Patient presents with   Toe Pain        HPI: Kaitlin Holloway is a 51 y.o. female with medical history significant for diabetes mellitus, hypertension, rheumatoid arthritis, inflammatory myositis who presents to the ER for evaluation of pain and a nonhealing ulcer involving her right great toe. Patient states that she has been dealing with this ulcer for 6 months.  The ulcer involves the plantar surface of her right foot.  She said it had healed but not too long ago she noticed recurrence of the ulcer.  She complains of pain in her right foot but denies having any fever or chills. Patient appears to have type 2 diabetes mellitus and states that she was treated with metformin but discontinued due to GI symptoms and also glipizide which she stopped because it caused dizziness.  She states that she was prescribed insulin but has not been taking it.  Tells me that she was on systemic steroids for autoimmune disease which worsened her hyperglycemia. She complains of frequency of urination but denies having any dysuria or hematuria She denies having any blurred vision, no headache, no nausea, no vomiting, no abdominal pain, no changes in her bowel habits, no leg swelling, no blurred vision, no focal deficit. Labs showed serum glucose of 634, sodium 130, MRI of the right foot showed open wound on the plantar medial aspect of the great toe with surrounding changes of cellulitis and myofasciitis. No discrete drainable soft tissue abscess. Osteomyelitis involving the distal phalanx of the great toe. She received 10 units of NovoLog in the ER as well as 1 L of IV fluids and will be admitted to the hospital for further evaluation.    Review of Systems: As mentioned in  the history of present illness. All other systems reviewed and are negative. Past Medical History:  Diagnosis Date   Arthritis    knee   COVID-19 06/2018   Residual joint pain   Diabetes mellitus without complication (HCC)    Type 2, does not take her meds.   Hypertension 2002   Joint pain    since having COVID 06/2018   Motion sickness    boats, car - back seat   Prediabetes 2016   Past Surgical History:  Procedure Laterality Date   ABDOMINAL HYSTERECTOMY  2016   unilateral oophorectomy.  Does not know what side.  For fibroids and heavy bleeding.   APPENDECTOMY     age 9   CATARACT EXTRACTION W/PHACO Right 01/28/2020   Procedure: CATARACT EXTRACTION PHACO AND INTRAOCULAR LENS PLACEMENT ([facility]) RIGHT VISION BLUE 13.73  01:10.8;  Surgeon: Marchia Meiers, MD;  Location: [place];  Service: Ophthalmology;  Laterality: Right;  Diabetes Latex   CATARACT EXTRACTION W/PHACO Left 06/21/2020   Procedure: CATARACT EXTRACTION PHACO AND INTRAOCULAR LENS PLACEMENT ([place]) LEFT;  Surgeon: Birder Robson, MD;  Location: [place];  Service: Ophthalmology;  Laterality: Left;  18.17 1:22.4   MUSCLE BIOPSY Left 03/28/2020   Procedure: LEFT THIGH MUSCLE BIOPSY;  Surgeon: Felicie Morn, MD;  Location: [facility];  Service: General;  Laterality: Left;   TONSILLECTOMY     Social History:  reports that she quit smoking about 21 years ago. Her smoking use included cigarettes. She has a 25.50 pack-year smoking  history. She has never used smokeless tobacco. She reports current alcohol use of about 2.0 standard drinks of alcohol per week. She reports that she does not use drugs.  Allergies  Allergen Reactions   Penicillins Other (See Comments)    Has patient had a PCN reaction causing immediate rash, facial/tongue/throat swelling, SOB or lightheadedness with hypotension: Yes Has patient had a PCN reaction causing severe rash involving mucus membranes or skin necrosis: No Has patient had  a PCN reaction that required hospitalization: No Has patient had a PCN reaction occurring within the last 10 years: No If all of the above answers are "NO", then may proceed with Cephalosporin use.    Aspirin Other (See Comments)    Muscle spasms in back   Garlic Swelling   Morphine And Related Other (See Comments)    Headaches/ migraine   Gadavist [Gadobutrol] Nausea And Vomiting    Nausea and vomiting immediately after contrast injection    Latex Rash    Gloves - when worn    Family History  Problem Relation Age of Onset   Heart disease Mother 47       AMI   Diabetes Mother    Hypertension Mother    Hyperlipidemia Mother     Prior to Admission medications   Medication Sig Start Date End Date Taking? Authorizing Provider  ciprofloxacin (CIPRO) 500 MG tablet Take 1 tablet (500 mg total) by mouth 2 (two) times daily. 03/13/22   Trula Slade, DPM  clindamycin (CLEOCIN) 300 MG capsule Take 1 capsule (300 mg total) by mouth 3 (three) times daily. 01/01/22   Trula Slade, DPM  doxycycline (VIBRA-TABS) 100 MG tablet Take 1 tablet (100 mg total) by mouth 2 (two) times daily. 03/05/22   Trula Slade, DPM  doxycycline (VIBRA-TABS) 100 MG tablet Take 1 tablet (100 mg total) by mouth 2 (two) times daily. 03/13/22   Trula Slade, DPM  folic acid (FOLVITE) 1 MG tablet Take 1 tablet by mouth once daily for 30 days    [provider]  insulin aspart (NOVOLOG FLEXPEN) 100 UNIT/ML FlexPen Inject 5 Units into the skin 3 (three) times daily with meals. 08/17/21   Ladell Pier, MD  insulin glargine-yfgn (SEMGLEE, YFGN,) 100 UNIT/ML Pen Inject 14 Units into the skin daily. 08/17/21   Ladell Pier, MD  losartan (COZAAR) 25 MG tablet Take 1 tablet by mouth once daily 01/18/22   Ladell Pier, MD  methotrexate 2.5 MG tablet See admin instructions. 04/26/21   [provider]  silver sulfADIAZINE (SILVADENE) 1 % cream Apply 1 Application topically daily.  11/07/21   Trula Slade, DPM  sulfamethoxazole-trimethoprim (BACTRIM DS) 800-160 MG tablet Take 1 tablet by mouth 2 (two) times daily. 12/19/21   Trula Slade, DPM  lisinopril-hydrochlorothiazide (ZESTORETIC) 20-12.5 MG tablet Take 1 tablet by mouth daily. Patient not taking: Reported on 07/11/2018 12/06/17 08/14/19  Jola Schmidt, MD    Physical Exam: Vitals:   03/18/22 1159 03/18/22 1207 03/18/22 1208  BP:   (!) 150/78  Pulse:  72   Resp:  18   Temp:  98 F (36.7 C)   TempSrc:  Oral   SpO2:  99%   Weight: 102.1 kg    Height: 6\' 2"  (1.88 m)     Physical Exam Vitals and nursing note reviewed.  Constitutional:      Appearance: Normal appearance.  HENT:     Head: Normocephalic and atraumatic.     Nose:  Nose normal.     Mouth/Throat:     Mouth: Mucous membranes are moist.  Eyes:     Conjunctiva/sclera: Conjunctivae normal.  Cardiovascular:     Rate and Rhythm: Normal rate and regular rhythm.  Pulmonary:     Effort: Pulmonary effort is normal.     Breath sounds: Normal breath sounds.  Abdominal:     General: Abdomen is flat. Bowel sounds are normal.     Palpations: Abdomen is soft.  Musculoskeletal:     Cervical back: Normal range of motion and neck supple.     Comments: Ulceration over the plantar surface of the right great toe  Skin:    General: Skin is warm.  Neurological:     General: No focal deficit present.     Mental Status: She is alert.  Psychiatric:        Mood and Affect: Mood normal.        Behavior: Behavior normal.     Data Reviewed: Relevant notes from primary care and specialist visits, past discharge summaries as available in EHR, including Care Everywhere. Prior diagnostic testing as pertinent to current admission diagnoses Updated medications and problem lists for reconciliation ED course, including vitals, labs, imaging, treatment and response to treatment Triage notes, nursing and pharmacy notes and ED provider's notes Notable results  as noted in HPI Labs reviewed.  Sodium 130, potassium 4.2, chloride 100, bicarb 24, glucose 634, BUN 33, creatinine 1.0, calcium 8.8, total protein 6.8, albumin 3.2, AST 20, ALT 24, alkaline phosphatase 228, total bilirubin 0.7, beta hydroxybutyric acid 0.34, white count 7.0, hemoglobin 14.9, hematocrit 41.4, platelets 187 Right great toe x-ray shows No plain film findings to suggest septic arthritis or osteomyelitis.  There are no new results to review at this time.  Assessment and Plan: * Hyperglycemia due to diabetes mellitus ([place]) Patient has diabetes melitis and presents with significant hyperglycemia secondary to medication noncompliance Noted to have serum glucose of 634 with no evidence of metabolic acidosis Aggressive IV fluid resuscitation Start patient on Levemir 20 units daily and NovoLog sliding scale coverage Diabetic education  Hyponatremia Secondary to hyperglycemia Expect improvement in serum sodium levels following resolution of hyperglycemia  Osteomyelitis (HCC) Noted to have osteomyelitis of the right great toe Start patient empirically on doxycycline 100 mg twice daily Podiatry consult  Polyarthralgia Continue methotrexate and folic acid  Essential hypertension Continue losartan      Advance Care Planning:   Code Status: Full Code   Consults: Podiatry  Family Communication: Greater than 50% of time was spent discussing patient's condition and plan of care with her at the bedside.  All questions and concerns have been addressed.  She verbalizes understanding and agrees with the plan.  Severity of Illness: The appropriate patient status for this patient is INPATIENT. Inpatient status is judged to be reasonable and necessary in order to provide the required intensity of service to ensure the patient's safety. The patient's presenting symptoms, physical exam findings, and initial radiographic and laboratory data in the context of their chronic comorbidities is  felt to place them at high risk for further clinical deterioration. Furthermore, it is not anticipated that the patient will be medically stable for discharge from the hospital within 2 midnights of admission.   * I certify that at the point of admission it is my clinical judgment that the patient will require inpatient hospital care spanning beyond 2 midnights from the point of admission due to high intensity of service, high risk for  further deterioration and high frequency of surveillance required.*  Author: Lucile Shutters, MD 03/18/2022 4:09 PM  For on call review [email or web address].

## 2022-03-18 NOTE — ED Provider Notes (Signed)
[facility] Provider Note    Event Date/Time   First MD Initiated Contact with Patient 03/18/22 1454     (approximate)   History   Chief Complaint: Toe Pain (/)   HPI  Kaitlin Holloway is a 51 y.o. female with a past history of hypertension, diabetes who comes ED complaining of a chronic wound on the right great toe that is becoming increasingly swollen and painful over the past week.  She reports this has been present for several months, and she saw podiatry several weeks ago.  She is completed a course of doxycycline and ciprofloxacin.  Denies chest pain shortness of breath or fever.  Reports that she does not take any medicines for the diabetes because she does not think she needs it.     Physical Exam   Triage Vital Signs: ED Triage Vitals  Enc Vitals Group     BP 03/18/22 1208 (!) 150/78     Pulse Rate 03/18/22 1207 72     Resp 03/18/22 1207 18     Temp 03/18/22 1207 98 F (36.7 C)     Temp Source 03/18/22 1207 Oral     SpO2 03/18/22 1207 99 %     Weight 03/18/22 1159 225 lb 1.4 oz (102.1 kg)     Height 03/18/22 1159 6\' 2"  (1.88 m)     Head Circumference --      Peak Flow --      Pain Score 03/18/22 1201 7     Pain Loc --      Pain Edu? --      Excl. in GC? --     Most recent vital signs: Vitals:   03/18/22 1207 03/18/22 1208  BP:  (!) 150/78  Pulse: 72   Resp: 18   Temp: 98 F (36.7 C)   SpO2: 99%     General: Awake, no distress.  CV:  Good peripheral perfusion.  Regular rate and rhythm Resp:  Normal effort.  Clear to auscultation bilaterally Abd:  No distention.  Other:  No calf tenderness or lower leg swelling.  There is erythema swelling and maceration of the right great toe with a plantar ulceration that has necrotic tissue at the base.  No purulent drainage.  There is tenderness along the lateral aspect of the toe without crepitus.  There is erythema extending into the forefoot as well.   ED Results / Procedures /  Treatments   Labs (all labs ordered are listed, but only abnormal results are displayed) Labs Reviewed  COMPREHENSIVE METABOLIC PANEL - Abnormal; Notable for the following components:      Result Value   Sodium 130 (*)    Glucose, Bld 634 (*)    BUN 33 (*)    Calcium 8.8 (*)    Albumin 3.2 (*)    Alkaline Phosphatase 228 (*)    All other components within normal limits  BETA-HYDROXYBUTYRIC ACID - Abnormal; Notable for the following components:   Beta-Hydroxybutyric Acid 0.34 (*)    All other components within normal limits  CBG MONITORING, ED - Abnormal; Notable for the following components:   Glucose-Capillary 393 (*)    All other components within normal limits  CBC WITH DIFFERENTIAL/PLATELET  HIV ANTIBODY (ROUTINE TESTING W REFLEX)  HEMOGLOBIN A1C  SEDIMENTATION RATE  C-REACTIVE PROTEIN  PREALBUMIN  CBC  BASIC METABOLIC PANEL     EKG    RADIOLOGY X-ray right foot interpreted by me, negative for cortical destruction or periosteal reaction,  no subcutaneous gas.  Radiology report reviewed.  MRI right foot positive for osteomyelitis.  No drainable fluid collection.   PROCEDURES:  Procedures   MEDICATIONS ORDERED IN ED: Medications  losartan (COZAAR) tablet 25 mg (has no administration in time range)  folic acid (FOLVITE) tablet 1 mg (has no administration in time range)  doxycycline (VIBRA-TABS) tablet 100 mg (has no administration in time range)  enoxaparin (LOVENOX) injection 40 mg (has no administration in time range)  acetaminophen (TYLENOL) tablet 650 mg (has no administration in time range)    Or  acetaminophen (TYLENOL) suppository 650 mg (has no administration in time range)  ondansetron (ZOFRAN) tablet 4 mg (has no administration in time range)    Or  ondansetron (ZOFRAN) injection 4 mg (has no administration in time range)  insulin aspart (novoLOG) injection 0-15 Units (15 Units Subcutaneous Given 03/18/22 1649)  0.9 %  sodium chloride infusion (has  no administration in time range)  insulin glargine-yfgn (SEMGLEE) injection 20 Units (has no administration in time range)  sodium chloride 0.9 % bolus 1,000 mL (1,000 mLs Intravenous New Bag/Given 03/18/22 1523)  ceFEPIme (MAXIPIME) 2 g in sodium chloride 0.9 % 100 mL IVPB (0 g Intravenous Stopped 03/18/22 1553)  vancomycin (VANCOCIN) IVPB 1000 mg/200 mL premix (0 mg Intravenous Stopped 03/18/22 1640)  insulin aspart (novoLOG) injection 10 Units (10 Units Intravenous Given 03/18/22 1545)     IMPRESSION / MDM / ASSESSMENT AND PLAN / ED COURSE  I reviewed the triage vital signs and the nursing notes.                              Differential diagnosis includes, but is not limited to, cellulitis, abscess, osteomyelitis, chronic foot wound, uncontrolled diabetes, electrolyte abnormality, metabolic acidosis  Patient's presentation is most consistent with acute presentation with potential threat to life or bodily function.  Patient presents with right foot wound which is chronic but worsening, not healed up after dual antibiotic therapy and outpatient debridement by podiatry.  Labs reveal serum glucose of 600, no evidence of acidosis.  Foot x-ray is unremarkable, but with this wound highly suspicious for osteo-, MRI was obtained which is positive for osteomyelitis.  No signs of abscess or necrotizing fasciitis.  Case discussed with hospitalist for further management.  IV vancomycin and cefepime ordered for initial broad-spectrum coverage due to concern for staph, strep, Pseudomonas.  Patient is not septic       FINAL CLINICAL IMPRESSION(S) / ED DIAGNOSES   Final diagnoses:  Osteomyelitis of right foot, unspecified type (HCC)  Uncontrolled type 2 diabetes mellitus with hypoglycemia without coma (HCC)     Rx / DC Orders   ED Discharge Orders     None        Note:  This document was prepared using Dragon voice recognition software and may include unintentional dictation errors.    Sharman Cheek, MD 03/18/22 1820

## 2022-03-18 NOTE — Plan of Care (Signed)
Consult received for R hallux wound, possible osteomyelitis seen on MRI R foot  Pt will be evaluated tmrw by covering provider for [facility] from our team. Recommend IV abx, NPO at 0500 tomorrow for possible Toe amputation after 5 pm tmrw.   Please hold anticoagulation pre op.         Corinna Gab, DPM [facility]                   03/18/2022

## 2022-03-18 NOTE — Consult Note (Signed)
PHARMACY -  BRIEF ANTIBIOTIC NOTE   Pharmacy has received consult(s) for Cefepime and Vancomycin from an ED provider.  The patient's profile has been reviewed for ht/wt/allergies/indication/available labs.    One time order(s) placed for : Cefepime 2gm IVPB x 1 Vancomycin 1000mg  IVPB x 1   Further antibiotics/pharmacy consults should be ordered by admitting physician if indicated.                       Thank you, Levaeh Vice Rodriguez-Guzman PharmD, BCPS 03/18/2022 3:18 PM

## 2022-03-18 NOTE — Progress Notes (Signed)
       CROSS COVER NOTE  NAME: Kendra Grissett MRN: [id number] DOB : 07-Apr-1971 ATTENDING PHYSICIAN: Lucile Shutters, MD    Date of Service   03/18/2022   HPI/Events of Note   Medication request for 9/10 (R) foot pain described as aching from sole of foot up to ankle not relieved by Tylenol.  Patient also requesting MRI Spine to evaluate for numbness of (R) foot. (+) pulse. No loss of bowel/bladder control, no saddle paresthesia. Will defer MRI decision to day team.  Interventions   Assessment/Plan:  Oxycodone 5mg  x1     This document was prepared using Dragon voice recognition software and may include unintentional dictation errors.  DNP, MBA, FNP-BC Nurse Practitioner [facility] Pager [phone]

## 2022-03-18 NOTE — Assessment & Plan Note (Addendum)
Osteomyelitis distal phalanx right great toe confirmed on MRI Patient is refusing amputation at this time but agreeable for biopsy of the bone to get culture data Will get ID consult for antibiotic management.  Patient received vancomycin and cefepime yesterday in the ED

## 2022-03-18 NOTE — ED Triage Notes (Signed)
Presents with   pain to right great toe  states she hit it about a weeks ago  and hit it again  Also has blister to foot

## 2022-03-19 ENCOUNTER — Encounter
Admission: EM | Disposition: A | Discharge status: Home / Self Care | Payer: Self-pay | Source: Home / Self Care | Attending: Internal Medicine

## 2022-03-19 ENCOUNTER — Inpatient Hospital Stay: Payer: Commercial Managed Care - HMO | Admitting: Anesthesiology

## 2022-03-19 ENCOUNTER — Ambulatory Visit (HOSPITAL_COMMUNITY): Admission: RE | Admit: 2022-03-19 | Payer: Commercial Managed Care - HMO | Source: Ambulatory Visit

## 2022-03-19 DIAGNOSIS — L089 Local infection of the skin and subcutaneous tissue, unspecified: Secondary | ICD-10-CM | POA: Diagnosis not present

## 2022-03-19 DIAGNOSIS — Z794 Long term (current) use of insulin: Secondary | ICD-10-CM

## 2022-03-19 DIAGNOSIS — E1169 Type 2 diabetes mellitus with other specified complication: Secondary | ICD-10-CM | POA: Diagnosis not present

## 2022-03-19 DIAGNOSIS — E11628 Type 2 diabetes mellitus with other skin complications: Secondary | ICD-10-CM

## 2022-03-19 DIAGNOSIS — E871 Hypo-osmolality and hyponatremia: Secondary | ICD-10-CM

## 2022-03-19 DIAGNOSIS — M059 Rheumatoid arthritis with rheumatoid factor, unspecified: Secondary | ICD-10-CM

## 2022-03-19 DIAGNOSIS — E1165 Type 2 diabetes mellitus with hyperglycemia: Secondary | ICD-10-CM

## 2022-03-19 DIAGNOSIS — E11649 Type 2 diabetes mellitus with hypoglycemia without coma: Secondary | ICD-10-CM

## 2022-03-19 DIAGNOSIS — I10 Essential (primary) hypertension: Secondary | ICD-10-CM

## 2022-03-19 DIAGNOSIS — M3390 Dermatopolymyositis, unspecified, organ involvement unspecified: Secondary | ICD-10-CM | POA: Diagnosis not present

## 2022-03-19 DIAGNOSIS — M86171 Other acute osteomyelitis, right ankle and foot: Secondary | ICD-10-CM

## 2022-03-19 DIAGNOSIS — M869 Osteomyelitis, unspecified: Secondary | ICD-10-CM | POA: Diagnosis not present

## 2022-03-19 HISTORY — PX: BONE BIOPSY: SHX375

## 2022-03-19 LAB — CBC
HCT: 37.7 % (ref 36.0–46.0)
Hemoglobin: 13.2 g/dL (ref 12.0–15.0)
MCH: 28.6 pg (ref 26.0–34.0)
MCHC: 35 g/dL (ref 30.0–36.0)
MCV: 81.6 fL (ref 80.0–100.0)
Platelets: 167 10*3/uL (ref 150–400)
RBC: 4.62 MIL/uL (ref 3.87–5.11)
RDW: 11.6 % (ref 11.5–15.5)
WBC: 5.8 10*3/uL (ref 4.0–10.5)
nRBC: 0 % (ref 0.0–0.2)

## 2022-03-19 LAB — GLUCOSE, CAPILLARY
Glucose-Capillary: 398 mg/dL — ABNORMAL HIGH (ref 70–99)
Glucose-Capillary: 240 mg/dL — ABNORMAL HIGH (ref 70–99)
Glucose-Capillary: 130 mg/dL — ABNORMAL HIGH (ref 70–99)
Glucose-Capillary: 143 mg/dL — ABNORMAL HIGH (ref 70–99)

## 2022-03-19 LAB — BASIC METABOLIC PANEL
Anion gap: 8 (ref 5–15)
BUN: 31 mg/dL — ABNORMAL HIGH (ref 6–20)
CO2: 22 mmol/L (ref 22–32)
Calcium: 8.5 mg/dL — ABNORMAL LOW (ref 8.9–10.3)
Chloride: 103 mmol/L (ref 98–111)
Creatinine, Ser: 1.06 mg/dL — ABNORMAL HIGH (ref 0.44–1.00)
GFR, Estimated: >60 mL/min (ref >60)
Glucose, Bld: 549 mg/dL (ref 70–99)
Potassium: 4.7 mmol/L (ref 3.5–5.1)
Sodium: 133 mmol/L — ABNORMAL LOW (ref 135–145)

## 2022-03-19 LAB — PREALBUMIN: Prealbumin: 18 mg/dL (ref 18–38)

## 2022-03-19 LAB — C-REACTIVE PROTEIN: CRP: 1.8 mg/dL — ABNORMAL HIGH (ref <1.0)

## 2022-03-19 LAB — HIV ANTIBODY (ROUTINE TESTING W REFLEX): HIV Screen 4th Generation wRfx: NONREACTIVE

## 2022-03-19 SURGERY — BIOPSY, BONE
Anesthesia: General | Site: Foot | Laterality: Right

## 2022-03-19 MED ORDER — HYDROMORPHONE HCL 1 MG/ML IJ SOLN
INTRAMUSCULAR | Status: AC
  Filled 2022-03-19: qty 1

## 2022-03-19 MED ORDER — OXYCODONE HCL 5 MG/5ML PO SOLN: 5.0000 mg | Freq: Once | ORAL | Status: DC | PRN

## 2022-03-19 MED ORDER — VANCOMYCIN HCL IN DEXTROSE 1-5 GM/200ML-% IV SOLN
1000.0000 mg | Freq: Once | INTRAVENOUS | Status: DC
  Filled 2022-03-19: qty 200

## 2022-03-19 MED ORDER — HYDROMORPHONE HCL 1 MG/ML IJ SOLN
0.2500 mg | INTRAMUSCULAR | Status: DC | PRN
  Administered 2022-03-19 (×2): 0.5 mg via INTRAVENOUS

## 2022-03-19 MED ORDER — 0.9 % SODIUM CHLORIDE (POUR BTL) OPTIME
TOPICAL | Status: DC | PRN
  Administered 2022-03-19: 1000 mL

## 2022-03-19 MED ORDER — VANCOMYCIN HCL 2000 MG/400ML IV SOLN
2000.0000 mg | Freq: Once | INTRAVENOUS | Status: AC
  Administered 2022-03-19: 2000 mg via INTRAVENOUS
  Filled 2022-03-19: qty 400

## 2022-03-19 MED ORDER — SODIUM CHLORIDE 0.9 % IV SOLN
2.0000 g | Freq: Three times a day (TID) | INTRAVENOUS | Status: DC
  Administered 2022-03-19 – 2022-03-20 (×4): 2 g via INTRAVENOUS
  Filled 2022-03-19 (×9): qty 12.5

## 2022-03-19 MED ORDER — FENTANYL CITRATE (PF) 100 MCG/2ML IJ SOLN
INTRAMUSCULAR | Status: DC | PRN
  Administered 2022-03-19 (×2): 50 ug via INTRAVENOUS

## 2022-03-19 MED ORDER — INSULIN ASPART 100 UNIT/ML IJ SOLN
10.0000 [IU] | Freq: Once | INTRAMUSCULAR | Status: AC
  Administered 2022-03-19: 10 [IU] via SUBCUTANEOUS
  Filled 2022-03-19: qty 1

## 2022-03-19 MED ORDER — VITAMIN C 500 MG PO TABS
500.0000 mg | ORAL_TABLET | Freq: Two times a day (BID) | ORAL | Status: DC
  Administered 2022-03-20 – 2022-03-22 (×5): 500 mg via ORAL
  Filled 2022-03-19 (×6): qty 1

## 2022-03-19 MED ORDER — SODIUM CHLORIDE 0.9 % IV SOLN: INTRAVENOUS | Status: DC | PRN

## 2022-03-19 MED ORDER — VANCOMYCIN HCL 1250 MG/250ML IV SOLN
1250.0000 mg | Freq: Two times a day (BID) | INTRAVENOUS | Status: DC
  Administered 2022-03-20 – 2022-03-21 (×4): 1250 mg via INTRAVENOUS
  Filled 2022-03-19 (×5): qty 250

## 2022-03-19 MED ORDER — PROPOFOL 10 MG/ML IV BOLUS
INTRAVENOUS | Status: AC
  Filled 2022-03-19: qty 20

## 2022-03-19 MED ORDER — JUVEN PO PACK
1.0000 | PACK | Freq: Two times a day (BID) | ORAL | Status: DC
  Administered 2022-03-20 – 2022-03-22 (×3): 1 via ORAL

## 2022-03-19 MED ORDER — BUPIVACAINE HCL 0.5 % IJ SOLN
INTRAMUSCULAR | Status: DC | PRN
  Administered 2022-03-19: 10 mL

## 2022-03-19 MED ORDER — ZINC SULFATE 220 (50 ZN) MG PO CAPS
220.0000 mg | ORAL_CAPSULE | Freq: Every day | ORAL | Status: DC
  Administered 2022-03-20 – 2022-03-22 (×3): 220 mg via ORAL
  Filled 2022-03-19 (×3): qty 1

## 2022-03-19 MED ORDER — INSULIN ASPART 100 UNIT/ML IJ SOLN
0.0000 [IU] | INTRAMUSCULAR | Status: DC
  Administered 2022-03-19: 3 [IU] via SUBCUTANEOUS
  Administered 2022-03-20 (×2): 20 [IU] via SUBCUTANEOUS
  Administered 2022-03-20: 7 [IU] via SUBCUTANEOUS
  Filled 2022-03-19 (×4): qty 1

## 2022-03-19 MED ORDER — INSULIN ASPART 100 UNIT/ML IJ SOLN: 0.0000 [IU] | Freq: Three times a day (TID) | INTRAMUSCULAR | Status: DC

## 2022-03-19 MED ORDER — MIDAZOLAM HCL 2 MG/2ML IJ SOLN
INTRAMUSCULAR | Status: AC
  Filled 2022-03-19: qty 2

## 2022-03-19 MED ORDER — ONDANSETRON HCL 4 MG/2ML IJ SOLN
INTRAMUSCULAR | Status: DC | PRN
  Administered 2022-03-19: 4 mg via INTRAVENOUS

## 2022-03-19 MED ORDER — OXYCODONE HCL 5 MG PO TABS
5.0000 mg | ORAL_TABLET | Freq: Four times a day (QID) | ORAL | Status: DC | PRN
  Administered 2022-03-19 – 2022-03-20 (×3): 5 mg via ORAL
  Filled 2022-03-19 (×3): qty 1

## 2022-03-19 MED ORDER — FENTANYL CITRATE (PF) 100 MCG/2ML IJ SOLN
INTRAMUSCULAR | Status: AC
  Filled 2022-03-19: qty 2

## 2022-03-19 MED ORDER — DEXMEDETOMIDINE HCL IN NACL 80 MCG/20ML IV SOLN
INTRAVENOUS | Status: DC | PRN
  Administered 2022-03-19: 4 ug via BUCCAL
  Administered 2022-03-19: 8 ug via BUCCAL

## 2022-03-19 MED ORDER — LIVING WELL WITH DIABETES BOOK
Freq: Once | Status: AC
  Filled 2022-03-19: qty 1

## 2022-03-19 MED ORDER — VANCOMYCIN HCL 1500 MG/300ML IV SOLN
1500.0000 mg | Freq: Once | INTRAVENOUS | Status: DC
  Filled 2022-03-19: qty 300

## 2022-03-19 MED ORDER — ADULT MULTIVITAMIN W/MINERALS CH
1.0000 | ORAL_TABLET | Freq: Every day | ORAL | Status: DC
  Administered 2022-03-20 – 2022-03-22 (×3): 1 via ORAL
  Filled 2022-03-19 (×3): qty 1

## 2022-03-19 MED ORDER — MIDAZOLAM HCL 2 MG/2ML IJ SOLN
INTRAMUSCULAR | Status: DC | PRN
  Administered 2022-03-19: 2 mg via INTRAVENOUS

## 2022-03-19 MED ORDER — OXYCODONE HCL 5 MG PO TABS: 5.0000 mg | ORAL_TABLET | Freq: Once | ORAL | Status: DC | PRN

## 2022-03-19 MED ORDER — CYCLOBENZAPRINE HCL 10 MG PO TABS
5.0000 mg | ORAL_TABLET | Freq: Three times a day (TID) | ORAL | Status: DC | PRN
  Administered 2022-03-19: 5 mg via ORAL
  Filled 2022-03-19: qty 1

## 2022-03-19 MED ORDER — PROPOFOL 10 MG/ML IV BOLUS
INTRAVENOUS | Status: DC | PRN
  Administered 2022-03-19: 30 mg via INTRAVENOUS
  Administered 2022-03-19: 20 mg via INTRAVENOUS

## 2022-03-19 MED ORDER — INSULIN STARTER KIT- PEN NEEDLES (ENGLISH)
1.0000 | Freq: Once | Status: AC
  Administered 2022-03-20: 1
  Filled 2022-03-19: qty 1

## 2022-03-19 SURGICAL SUPPLY — 52 items
BLADE MED AGGRESSIVE (BLADE) ×1 IMPLANT
BLADE OSC/SAGITTAL MD 5.5X18 (BLADE) IMPLANT
BLADE SURG 15 STRL LF DISP TIS (BLADE) IMPLANT
BLADE SURG 15 STRL SS (BLADE)
BLADE SURG MINI STRL (BLADE) IMPLANT
BNDG ELASTIC 4X5.8 VLCR STR LF (GAUZE/BANDAGES/DRESSINGS) ×1 IMPLANT
BNDG ESMARK 4X12 TAN STRL LF (GAUZE/BANDAGES/DRESSINGS) ×1 IMPLANT
BNDG GAUZE DERMACEA FLUFF 4 (GAUZE/BANDAGES/DRESSINGS) ×1 IMPLANT
CNTNR SPEC 2.5X3XGRAD LEK (MISCELLANEOUS) ×1
CONT SPEC 4OZ STER OR WHT (MISCELLANEOUS) ×1
CONT SPEC 4OZ STRL OR WHT (MISCELLANEOUS) ×1
CONTAINER SPEC 2.5X3XGRAD LEK (MISCELLANEOUS) IMPLANT
CUFF TOURN SGL QUICK 12 (TOURNIQUET CUFF) IMPLANT
CUFF TOURN SGL QUICK 18X4 (TOURNIQUET CUFF) IMPLANT
DRAPE FLUOR MINI C-ARM 54X84 (DRAPES) IMPLANT
DRSG EMULSION OIL 3X3 NADH (GAUZE/BANDAGES/DRESSINGS) IMPLANT
DURAPREP 26ML APPLICATOR (WOUND CARE) ×1 IMPLANT
ELECT REM PT RETURN 9FT ADLT (ELECTROSURGICAL) ×1
ELECTRODE REM PT RTRN 9FT ADLT (ELECTROSURGICAL) ×1 IMPLANT
GAUZE SPONGE 4X4 12PLY STRL (GAUZE/BANDAGES/DRESSINGS) ×2 IMPLANT
GAUZE STRETCH 2X75IN STRL (MISCELLANEOUS) IMPLANT
GAUZE XEROFORM 1X8 LF (GAUZE/BANDAGES/DRESSINGS) ×1 IMPLANT
GLOVE SURG ORTHO LTX SZ7 (GLOVE) ×1 IMPLANT
GOWN STRL REUS W/ TWL LRG LVL3 (GOWN DISPOSABLE) ×2 IMPLANT
GOWN STRL REUS W/TWL LRG LVL3 (GOWN DISPOSABLE) ×2
GRAFT MYRIAD 3 LAYER 5X5 (Graft) IMPLANT
HANDPIECE VERSAJET DEBRIDEMENT (MISCELLANEOUS) IMPLANT
IV NS IRRIG 3000ML ARTHROMATIC (IV SOLUTION) IMPLANT
KIT TURNOVER KIT A (KITS) ×1 IMPLANT
LABEL OR SOLS (LABEL) ×1 IMPLANT
MANIFOLD NEPTUNE II (INSTRUMENTS) ×1 IMPLANT
NDL BIOPSY JAMSHIDI 11X6 (NEEDLE) IMPLANT
NDL FILTER BLUNT 18X1 1/2 (NEEDLE) ×1 IMPLANT
NDL HYPO 25X1 1.5 SAFETY (NEEDLE) ×2 IMPLANT
NEEDLE BIOPSY JAMSHIDI 11X6 (NEEDLE) ×1 IMPLANT
NEEDLE FILTER BLUNT 18X1 1/2 (NEEDLE) ×1 IMPLANT
NEEDLE HYPO 25X1 1.5 SAFETY (NEEDLE) ×2 IMPLANT
NS IRRIG 500ML POUR BTL (IV SOLUTION) ×1 IMPLANT
PACK EXTREMITY ARMC (MISCELLANEOUS) ×1 IMPLANT
SOL PREP PVP 2OZ (MISCELLANEOUS) ×1
SOLUTION PREP PVP 2OZ (MISCELLANEOUS) ×1 IMPLANT
STOCKINETTE STRL 6IN 960660 (GAUZE/BANDAGES/DRESSINGS) ×1 IMPLANT
SUT ETHILON 3-0 FS-10 30 BLK (SUTURE) ×1
SUT MNCRL AB 3-0 PS2 27 (SUTURE) ×1 IMPLANT
SUT PROLENE 3 0 PS 2 (SUTURE) IMPLANT
SUT VIC AB 3-0 SH 27 (SUTURE)
SUT VIC AB 3-0 SH 27X BRD (SUTURE) IMPLANT
SUTURE EHLN 3-0 FS-10 30 BLK (SUTURE) ×1 IMPLANT
SWAB CULTURE AMIES ANAERIB BLU (MISCELLANEOUS) IMPLANT
SYR 10ML LL (SYRINGE) ×1 IMPLANT
TRAP FLUID SMOKE EVACUATOR (MISCELLANEOUS) ×1 IMPLANT
WATER STERILE IRR 500ML POUR (IV SOLUTION) ×1 IMPLANT

## 2022-03-19 NOTE — Progress Notes (Signed)
Pharmacy Antibiotic Note  Kaitlin Holloway is a 51 y.o. female admitted on 03/18/2022 with  diabetic foot wound and possible osteomyelitis .  Pharmacy has been consulted for vancomycin dosing. Received vancomycin and cefepime x1 in ED 11/26 then started po doxycycline x 2 doses then held for biopsy of R great toe by podiatry evening of 11/27  Today, 03/19/2022 Renal - Scr 1.06 WBC WNL No cultures  Plan: Vancomycin 2gm IV x1 now then vancomycin 1250mg  IV q12h. Start at 2200 (so to be given after surgery/biopsy 11/27) Goal AUC 400-600 Predicted AUC 503 using Vd 0.72 and SCr 1.06 Follow renal function closely and check levels as needed Await culture data  Height: 6\' 2"  (188 cm) Weight: 102.1 kg (225 lb 1.4 oz) IBW/kg (Calculated) : 77.7  Temp (24hrs), Avg:98.1 F (36.7 C), Min:97.7 F (36.5 C), Max:98.6 F (37 C)  Recent Labs  Lab 03/18/22 1215 03/19/22 0507  WBC 7.0 5.8  CREATININE 1.00 1.06*    Estimated Creatinine Clearance: 85.8 mL/min (A) (by C-G formula based on SCr of 1.06 mg/dL (H)).    Allergies  Allergen Reactions   Aspirin Other (See Comments)    Muscle spasms in back  Other reaction(s): Muscle Pain, Other (See Comments)  Muscle spasms in back  Significant cramping and lower back pain   Penicillins Other (See Comments)    Has patient had a PCN reaction causing immediate rash, facial/tongue/throat swelling, SOB or lightheadedness with hypotension: Yes Has patient had a PCN reaction causing severe rash involving mucus membranes or skin necrosis: No Has patient had a PCN reaction that required hospitalization: No Has patient had a PCN reaction occurring within the last 10 years: No If all of the above answers are "NO", then may proceed with Cephalosporin use.    Garlic Swelling   Metformin Other (See Comments)    Other reaction(s): abdominal pain, Other (See Comments)  Severe abdominal pain  GI upset   Morphine And Related Other (See Comments)    Headaches/  migraine   Gadavist [Gadobutrol] Nausea And Vomiting    Nausea and vomiting immediately after contrast injection    Latex Rash    Gloves - when worn    Antimicrobials this admission: 11/26 >> vanco/cefepime x 1 11/26 doxycycline >> 11/27 11/27 vancomycin >>  Dose adjustments this admission:   Microbiology results: 11/27 toe:   Thank you for allowing pharmacy to be a part of this patient's care.  12/27, PharmD, BCPS, BCIDP Work Cell: [phone] 03/19/2022 5:17 PM

## 2022-03-19 NOTE — Op Note (Addendum)
Patient Name: Kaitlin Holloway DOB: [birth year]  MRN: [id number]   Date of Service: 03/18/2022 - 03/19/2022  Surgeon: Dr. Lanae Crumbly, DPM Assistants: None Pre-operative Diagnosis:  Osteomyelitis right hallux Diabetic ulcer right hallux Post-operative Diagnosis:  Osteomyelitis right hallux Diabetic ulcer right hallux Procedures: 1) Preparation of wound bed for skin substitute 2.25 cm right hallux 2) Application of skin substitute 2.25 cm right hallux 3) Bone biopsy open superficial Pathology/Specimens: ID Type Source Tests Collected by Time Destination  1 : Distal hallux right great toe Tissue PATH Bone biopsy SURGICAL PATHOLOGY Criselda Peaches, DPM 03/19/2022 1802   A : Distal hallux right great toe Tissue PATH Bone biopsy AEROBIC/ANAEROBIC CULTURE W GRAM STAIN (SURGICAL/DEEP WOUND) Criselda Peaches, DPM 03/19/2022 1755    Anesthesia: MAC with local Hemostasis: Cautery, no tourniquet used Estimated Blood Loss: 10 cc  materials: * No implants in log * Medications: 10 cc half percent Marcaine plain Complications: No complications noted  Indications for Procedure:  This is a 51 y.o. female with a history of uncontrolled type 2 diabetes and polyneuropathy with an ulceration on the right hallux.  Preoperative MRI was concerning for osteomyelitis of the distal phalanx.  Patient was opposed to any amputation and wanted to make all attempts at limb salvage.  Biopsy and debridement was recommended to guide antibiotic therapy and facilitate wound healing.  All risk benefits and complications were discussed prior to surgery.  All questions were addressed.  No guarantees as the outcome of surgery were able to be made   Procedure in Detail: Patient was identified in pre-operative holding area. Formal consent was signed and the right lower extremity was marked. Patient was brought back to the operating room. Anesthesia was induced. The extremity was prepped and draped in the usual sterile fashion.  Timeout was taken to confirm patient name, laterality, and procedure prior to incision.   Attention was then directed to the right hallux which exhibited open ulceration.  Full-thickness excisional debridement was carried with a scalpel through the skin and subcutaneous tissue to debride the eschar from the wound of all nonviable tissues.  The total area of debridement measured 1.5 cm x 1.5 cm x 0.4 cm.  It tracked to the level of the deep tissue, there was no visible tendon or joint or bone.  No purulence was found.  Once all nonviable tissue was debrided and there was a healthy viable granular wound bed the wound was irrigated thoroughly with normal sterile saline.  The dorsal hallux was then reprepped with Betadine.  Using a sterile Jamshidi needle, an incision was made and the needle was advanced to the level of the cortex and then penetrated into the distal phalanx to obtain 2 core samples of bone.  It appeared to be fairly healthy and viable.  It was sent for tissue culture and pathology respectively.  I then returned my attention to the plantar ulceration, hemostasis was achieved with cautery.  A myriad matrix 5 x 5 cm graft was then cut to fit and inset into the wound and secured with Prolene sutures.  The foot was then dressed with surgical lube, Adaptic 4 x 4's Kerlix Kling and gauze and Ace wrap. Patient tolerated the procedure well.   Disposition: Following a period of post-operative monitoring, patient will be transferred to the floor.  The dressing will need to be changed daily with Surgilube.  Perfusion was adequate, noninvasive vascular testing has already been ordered and we will follow-up on this.  Infectious disease has been  consulted and today's cultures and pathology should be able to help guide further antibiotic management.  Standing orders for vancomycin cefepime and Flagyl were ordered with pharmacy consultation.

## 2022-03-19 NOTE — Transfer of Care (Signed)
Immediate Anesthesia Transfer of Care Note  Patient: Kaitlin Holloway  Procedure(s) Performed: RIGHT GREAT TOE BONE BIOPSY WITH WOUND DEBRIDEMENT AND GRAFT APPLICATION (Right: Foot)  Patient Location: PACU  Anesthesia Type:MAC  Level of Consciousness: awake, alert , and oriented  Airway & Oxygen Therapy: Patient Spontanous Breathing and Patient connected to face mask oxygen  Post-op Assessment: Report given to RN and Post -op Vital signs reviewed and stable  Post vital signs: Reviewed and stable  Last Vitals:  Vitals Value Taken Time  BP 100/71 03/19/22 1817  Temp    Pulse 79 03/19/22 1818  Resp 11 03/19/22 1818  SpO2 97 % 03/19/22 1818  Vitals shown include unvalidated device data.  Last Pain:  Vitals:   03/19/22 1512  TempSrc: Oral  PainSc:          Complications: No notable events documented.

## 2022-03-19 NOTE — Hospital Course (Signed)
51 year old female with a known history of diabetes, hypertension, rheumatoid arthritis, inflammatory myositis is admitted for right great toe distal phalanx osteomyelitis  11/27: Podiatry and ID consult.  Status post bone biopsy of right hallux 11/28: Added NovoLog 5 units 3 times daily and adjusted sliding scale per diabetic nurse recommendation 11/29: Increased Semglee and Novolog. Sugars > 400.  Cultures still pending for definitive antibiotic plan.

## 2022-03-19 NOTE — Consult Note (Addendum)
Reason for Consult: Right great toe infection, osteomyelitis Referring Physician: Dr. Billey Gosling Kaitlin Holloway is an 51 y.o. female.  HPI: She has had a longstanding ulcer for the last 6 months.  It was nearly fully healed and reopened recently became swollen and had some drainage and tenderness.  She has type 2 diabetes she attributes this to treatment of an autoimmune disorder following her COVID infection with multiple rounds of steroids.  She said she has been taking the medications but has not gotten under control.  She is very frustrated with this process and feels like the health system has failed her and created this problem for her.  Past Medical History:  Diagnosis Date   Arthritis    knee   COVID-19 06/2018   Residual joint pain   Diabetes mellitus without complication (HCC)    Type 2, does not take her meds.   Hypertension 2002   Joint pain    since having COVID 06/2018   Motion sickness    boats, car - back seat   Prediabetes 2016    Past Surgical History:  Procedure Laterality Date   ABDOMINAL HYSTERECTOMY  2016   unilateral oophorectomy.  Does not know what side.  For fibroids and heavy bleeding.   APPENDECTOMY     age 4   CATARACT EXTRACTION W/PHACO Right 01/28/2020   Procedure: CATARACT EXTRACTION PHACO AND INTRAOCULAR LENS PLACEMENT (IOC) RIGHT VISION BLUE 13.73  01:10.8;  Surgeon: Elliot Cousin, MD;  Location: [facility];  Service: Ophthalmology;  Laterality: Right;  Diabetes Latex   CATARACT EXTRACTION W/PHACO Left 06/21/2020   Procedure: CATARACT EXTRACTION PHACO AND INTRAOCULAR LENS PLACEMENT (IOC) LEFT;  Surgeon: Galen Manila, MD;  Location: [facility];  Service: Ophthalmology;  Laterality: Left;  18.17 1:22.4   MUSCLE BIOPSY Left 03/28/2020   Procedure: LEFT THIGH MUSCLE BIOPSY;  Surgeon: Quentin Ore, MD;  Location: [facility];  Service: General;  Laterality: Left;   TONSILLECTOMY      Family History  Problem Relation Age of  Onset   Heart disease Mother 59       AMI   Diabetes Mother    Hypertension Mother    Hyperlipidemia Mother     Social History:  reports that she quit smoking about 21 years ago. Her smoking use included cigarettes. She has a 25.50 pack-year smoking history. She has never used smokeless tobacco. She reports current alcohol use of about 2.0 standard drinks of alcohol per week. She reports that she does not use drugs.  Allergies:  Allergies  Allergen Reactions   Aspirin Other (See Comments)    Muscle spasms in back  Other reaction(s): Muscle Pain, Other (See Comments)  Muscle spasms in back  Significant cramping and lower back pain   Penicillins Other (See Comments)    Has patient had a PCN reaction causing immediate rash, facial/tongue/throat swelling, SOB or lightheadedness with hypotension: Yes Has patient had a PCN reaction causing severe rash involving mucus membranes or skin necrosis: No Has patient had a PCN reaction that required hospitalization: No Has patient had a PCN reaction occurring within the last 10 years: No If all of the above answers are "NO", then may proceed with Cephalosporin use.    Garlic Swelling   Metformin Other (See Comments)    Other reaction(s): abdominal pain, Other (See Comments)  Severe abdominal pain  GI upset   Morphine And Related Other (See Comments)    Headaches/ migraine   Gadavist [Gadobutrol] Nausea  And Vomiting    Nausea and vomiting immediately after contrast injection    Latex Rash    Gloves - when worn    Medications: I have reviewed the patient's current medications.  Results for orders placed or performed during the hospital encounter of 03/18/22 (from the past 48 hour(s))  Comprehensive metabolic panel     Status: Abnormal   Collection Time: 03/18/22 12:15 PM  Result Value Ref Range   Sodium 130 (L) 135 - 145 mmol/L   Potassium 4.2 3.5 - 5.1 mmol/L   Chloride 100 98 - 111 mmol/L   CO2 24 22 - 32 mmol/L   Glucose, Bld 634  (HH) 70 - 99 mg/dL    Comment: CRITICAL RESULT CALLED TO, READ BACK BY AND VERIFIED WITH ELANA BROKMAN @1331  03/18/22 MJU Glucose reference range applies only to samples taken after fasting for at least 8 hours.    BUN 33 (H) 6 - 20 mg/dL   Creatinine, Ser 03/20/22 0.44 - 1.00 mg/dL   Calcium 8.8 (L) 8.9 - 10.3 mg/dL   Total Protein 6.8 6.5 - 8.1 g/dL   Albumin 3.2 (L) 3.5 - 5.0 g/dL   AST 20 15 - 41 U/L   ALT 24 0 - 44 U/L   Alkaline Phosphatase 228 (H) 38 - 126 U/L   Total Bilirubin 0.7 0.3 - 1.2 mg/dL   GFR, Estimated 3.55 >73 mL/min    Comment: (NOTE) Calculated using the CKD-EPI Creatinine Equation (2021)    Anion gap 6 5 - 15    Comment: Performed at [facility], [address] [place], [place]  CBC with Differential     Status: None   Collection Time: 03/18/22 12:15 PM  Result Value Ref Range   WBC 7.0 4.0 - 10.5 K/uL   RBC 5.09 3.87 - 5.11 MIL/uL   Hemoglobin 14.9 12.0 - 15.0 g/dL   HCT 03/20/22 70.6 - 23.7 %   MCV 81.3 80.0 - 100.0 fL   MCH 29.3 26.0 - 34.0 pg   MCHC 36.0 30.0 - 36.0 g/dL   RDW 62.8 31.5 - 17.6 %   Platelets 187 150 - 400 K/uL   nRBC 0.0 0.0 - 0.2 %   Neutrophils Relative % 65 %   Neutro Abs 4.6 1.7 - 7.7 K/uL   Lymphocytes Relative 20 %   Lymphs Abs 1.4 0.7 - 4.0 K/uL   Monocytes Relative 6 %   Monocytes Absolute 0.4 0.1 - 1.0 K/uL   Eosinophils Relative 8 %   Eosinophils Absolute 0.5 0.0 - 0.5 K/uL   Basophils Relative 1 %   Basophils Absolute 0.0 0.0 - 0.1 K/uL   Immature Granulocytes 0 %   Abs Immature Granulocytes 0.02 0.00 - 0.07 K/uL    Comment: Performed at [facility], [address] [place], [place]  Beta-hydroxybutyric acid     Status: Abnormal   Collection Time: 03/18/22 12:15 PM  Result Value Ref Range   Beta-Hydroxybutyric Acid 0.34 (H) 0.05 - 0.27 mmol/L    Comment: Performed at [facility], [address] [place], [place]  CBG monitoring, ED     Status: Abnormal    Collection Time: 03/18/22  4:15 PM  Result Value Ref Range   Glucose-Capillary 393 (H) 70 - 99 mg/dL    Comment: Glucose reference range applies only to samples taken after fasting for at least 8 hours.  CBG monitoring, ED     Status: Abnormal   Collection  Time: 03/18/22  7:18 PM  Result Value Ref Range   Glucose-Capillary 522 (HH) 70 - 99 mg/dL    Comment: Glucose reference range applies only to samples taken after fasting for at least 8 hours.   Comment 1 Call MD NNP PA CNM   Sedimentation rate     Status: None   Collection Time: 03/18/22 10:13 PM  Result Value Ref Range   Sed Rate 10 0 - 30 mm/hr    Comment: Performed at [facility], [address] [place], [place] [address]  C-reactive protein     Status: Abnormal   Collection Time: 03/18/22 10:13 PM  Result Value Ref Range   CRP 1.8 (H) <1.0 mg/dL    Comment: Performed at [facility], [address], [place], [place] [address]  Prealbumin     Status: None   Collection Time: 03/18/22 10:13 PM  Result Value Ref Range   Prealbumin 18 18 - 38 mg/dL    Comment: Performed at [facility], [address] [place], [place] [address]  Glucose, capillary     Status: Abnormal   Collection Time: 03/18/22 10:18 PM  Result Value Ref Range   Glucose-Capillary 489 (H) 70 - 99 mg/dL    Comment: Glucose reference range applies only to samples taken after fasting for at least 8 hours.  CBC     Status: None   Collection Time: 03/19/22  5:07 AM  Result Value Ref Range   WBC 5.8 4.0 - 10.5 K/uL   RBC 4.62 3.87 - 5.11 MIL/uL   Hemoglobin 13.2 12.0 - 15.0 g/dL   HCT 63.8 75.6 - 43.3 %   MCV 81.6 80.0 - 100.0 fL   MCH 28.6 26.0 - 34.0 pg   MCHC 35.0 30.0 - 36.0 g/dL   RDW 29.5 18.8 - 41.6 %   Platelets 167 150 - 400 K/uL   nRBC 0.0 0.0 - 0.2 %    Comment: Performed at [facility], [address] [place], [place] [address]  Basic metabolic panel     Status: Abnormal   Collection Time: 03/19/22  5:07 AM   Result Value Ref Range   Sodium 133 (L) 135 - 145 mmol/L   Potassium 4.7 3.5 - 5.1 mmol/L   Chloride 103 98 - 111 mmol/L   CO2 22 22 - 32 mmol/L   Glucose, Bld 549 (HH) 70 - 99 mg/dL    Comment: CRITICAL RESULT CALLED TO, READ BACK BY AND VERIFIED WITH Zettie Cooley AT 1601 03/19/2022 DLB Glucose reference range applies only to samples taken after fasting for at least 8 hours.    BUN 31 (H) 6 - 20 mg/dL   Creatinine, Ser 0.93 (H) 0.44 - 1.00 mg/dL   Calcium 8.5 (L) 8.9 - 10.3 mg/dL   GFR, Estimated >23 >55 mL/min    Comment: (NOTE) Calculated using the CKD-EPI Creatinine Equation (2021)    Anion gap 8 5 - 15    Comment: Performed at [facility], [address] [place], [place] [address]    MR FOOT RIGHT WO CONTRAST  Result Date: 03/18/2022 CLINICAL DATA:  Ulcer involving the plantar medial aspect of the great toe. Pain and swelling. EXAM: MRI OF THE RIGHT FOREFOOT WITHOUT CONTRAST TECHNIQUE: Multiplanar, multisequence MR imaging of the right foot was performed. No intravenous contrast was administered. COMPARISON:  Radiographs, same date. FINDINGS: There is an open wound on the plantar medial aspect of the great toe with surrounding changes  of cellulitis I do not see a discrete drainable soft tissue abscess. There is abnormal T2 signal intensity in the distal phalanx of the great toe consistent with osteomyelitis. No findings suspicious for septic arthritis at the interphalangeal joint. The other bony structures are intact. No other sites of osteomyelitis are identified. There is diffuse cellulitis and myofasciitis without findings suspicious for pyomyositis. IMPRESSION: 1. Open wound on the plantar medial aspect of the great toe with surrounding changes of cellulitis and myofasciitis. No discrete drainable soft tissue abscess. 2. Osteomyelitis involving the distal phalanx of the great toe. 3. No findings for septic arthritis at the interphalangeal joint. Electronically  Signed   By: Rudie Meyer M.D.   On: 03/18/2022 14:17   DG Toe Great Right  Result Date: 03/18/2022 CLINICAL DATA:  Pain and swelling. Plantar ulcer of the great toe. EXAM: RIGHT GREAT TOE COMPARISON:  Radiographs 03/05/2022 FINDINGS: The joint spaces are maintained. No findings suspicious for septic arthritis or osteomyelitis. No obvious gas is seen in the soft tissues. IMPRESSION: No plain film findings to suggest septic arthritis or osteomyelitis. Electronically Signed   By: Rudie Meyer M.D.   On: 03/18/2022 12:43    Review of Systems  Skin:        Ulcer right great toe  All other systems reviewed and are negative.  Blood pressure (!) 154/92, pulse 81, temperature 97.7 F (36.5 C), temperature source Oral, resp. rate 20, height 6\' 2"  (1.88 m), weight 102.1 kg, SpO2 100 %.  Vitals:   03/18/22 2208 03/19/22 0426  BP: (!) 145/83 (!) 154/92  Pulse: 85 81  Resp: 16 20  Temp: 98 F (36.7 C) 97.7 F (36.5 C)  SpO2: 100% 100%    General AA&O x3. Normal mood and affect.  Vascular Dorsalis pedis and posterior tibial pulses  present 2+ right  Capillary refill normal to all digits. Pedal hair growth normal.  Neurologic Epicritic sensation grossly diminished.  Dermatologic (Wound) Full-thickness ulceration with exposed subcutaneous tissue, serous blister medially, no active drainage there is erythema and edema around the toe  Orthopedic: Motor intact BLE.      Assessment/Plan:  Osteomyelitis distal phalanx right great toe, type 2 diabetes with polyneuropathy -Imaging: Studies independently reviewed.  I reviewed the MRI images independently and showed these to the patient today with my impression of them.  We discussed the erosion that is present in the distal phalanx of the plantar medial base.  We discussed treatment of osteomyelitis that is usually quite difficult in toes to treat with antibiotics alone.  She is adamantly opposed to any sort of amputation. I recommend if we do not  proceed with any partial amputation then we at least need a biopsy of the bone with culture data to help guide antibiotic therapy.  She will need infectious disease consultation and PICC line placement.  She would like to wait until culture data is back and the path is back before proceeding with the PICC line. -Antibiotics: Recommend holding antibiotics until biopsy today -WB Status: WBAT -Wound Care: TBD following surgery today -Surgical Plan: to OR today for bone biopsy, debridement of wound and application of skin substitute -Maintain n.p.o. status  03/21/22 03/19/2022, 8:02 AM   Best available via secure chat for questions or concerns.

## 2022-03-19 NOTE — Assessment & Plan Note (Addendum)
Continue insulin sliding scale.  Continue insulin Semglee once daily.  Last hemoglobin A1c was 13.9 in April 2023.  Will recheck this admission.  Diabetic nurse following

## 2022-03-19 NOTE — TOC Initial Note (Signed)
Transition of Care [facility]) - Initial/Assessment Note    Patient Details  Name: Kaitlin Holloway MRN: [id number] Date of Birth: May 25, 1970  Transition of Care [facility]) CM/SW Contact:    Colen Darling, [place] Phone Number: 03/19/2022, 1:54 PM  Clinical Narrative:                  TOC met with the patient at bedside and her primary care doctor is Dr. Karle Plumber. She drives to appointments and picks up medication at [facility] on [address] in [place].   She has an elevated toilet seat, shower rails, a sharps container, glucose monitor, scale, diabetes supplies and blood pressure cuff.       Patient Goals and CMS Choice      Pending  Expected Discharge Plan and Services      Pending   Living arrangements for the past 2 months: [place]                                      Prior Living Arrangements/Services Living arrangements for the past 2 months: [place]   Patient language and need for interpreter reviewed:: No Do you feel safe going back to the place where you live?: Yes          Current home services: DME (patient has glucose monitor, elevated toilet seat, rails in shower, diabetes supplies, blood pressure cuff, scale)    Activities of Daily Living Home Assistive Devices/Equipment: Eyeglasses ADL Screening (condition at time of admission) Patient's cognitive ability adequate to safely complete daily activities?: Yes Is the patient deaf or have difficulty hearing?: No Does the patient have difficulty seeing, even when wearing glasses/contacts?: No Does the patient have difficulty concentrating, remembering, or making decisions?: No Patient able to express need for assistance with ADLs?: Yes Does the patient have difficulty dressing or bathing?: No Independently performs ADLs?: Yes (appropriate for developmental age) Does the patient have difficulty walking or climbing stairs?: No Weakness of Legs: None Weakness of  Arms/Hands: None  Permission Sought/Granted                  Emotional Assessment Appearance:: Appears stated age     Orientation: : Oriented to Self, Oriented to Place, Oriented to  Time, Oriented to Situation      Admission diagnosis:  Uncontrolled type 2 diabetes mellitus with hypoglycemia without coma ([place]) [E11.649] Osteomyelitis of right foot, unspecified type ([place]) [M86.9] Hyperglycemia due to diabetes mellitus ([place]) [E11.65] Patient Active Problem List   Diagnosis Date Noted   Hyperglycemia due to diabetes mellitus ([place]) 03/18/2022   Osteomyelitis ([place]) 03/18/2022   Hyponatremia 03/18/2022   Myositis 03/17/2020   Polyarthralgia 03/16/2020   Uncontrolled type 2 diabetes mellitus with hypoglycemia without coma ([place]) 03/16/2020   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

## 2022-03-19 NOTE — Inpatient Diabetes Management (Addendum)
Inpatient Diabetes Program Recommendations  AACE/ADA: New Consensus Statement on Inpatient Glycemic Control (2015)  Target Ranges:  Prepandial:   less than 140 mg/dL      Peak postprandial:   less than 180 mg/dL (1-2 hours)      Critically ill patients:  140 - 180 mg/dL   Lab Results  Component Value Date   GLUCAP 240 (H) 03/19/2022   HGBA1C 13.9 (H) 08/03/2021    Review of Glycemic Control  Diabetes history: DM2 Outpatient Diabetes medications: Listed as not taking Semglee 14 units qd, Novolog 5 units tid meal coverage Current orders for Inpatient glycemic control: Semglee 20 units, Novolog 0-15 units tid  Inpatient Diabetes Program Recommendations:    While patient is NPO: -Consider change Novolog correction to q 4 hrs.  Spoke with patient @ bedside. Patient has current prescriptions for Semglee ($20 per prescription) and Novolog ($18 per prescription). Patient had insulin @ home but didn't take due to understanding pen needles cost $600. Insulin has been out of the refrigerator x 2 months so will need new prescriptions @ discharge. Reviewed with patient to leave box of pens in refrigerator and to use on one pen @ a time until completely use and dispose per waste management in area. Reviewed hypoglycemia protocol and ordered Living Well With Diabetes and insulin pen starter kit.  [employer] that pt. Uses and clarified cost of insulin pen needles = $9 per box. Patient states she was told $ 600 for pen needles so that is why she was not taking. Patient states she will take insulin as prescribed and check CBGs with glucose meter @ home. Patient does not want to consider a Elenor Legato @ this time since has to remain on her arm.  Patient started craving drinks with sugar so has recently been drinking a large amount of soft drinks. Reviewed basic plate method with patient along with limiting carbohydrates and changing to water and sugar free drinks.  Needs on discharge: Wylene Men  flexpen [place] [phone] -Insulin pen needles [phone]  Thank you, Nani Gasser. Jhamal Plucinski, RN, MSN, CDE  Diabetes Coordinator Inpatient Glycemic Control Team Team Pager [phone] (8am-5pm) 03/19/2022 11:21 AM

## 2022-03-19 NOTE — Progress Notes (Signed)
  Progress Note   Patient: Kaitlin Holloway TGG:[id number] DOB: 25-Feb-1971 DOA: 03/18/2022     1 DOS: the patient was seen and examined on 03/19/2022   Brief hospital course: 51 year old female with a known history of diabetes, hypertension, rheumatoid arthritis, inflammatory myositis is admitted for right great toe distal phalanx osteomyelitis  11/27: Podiatry and ID consult.  Plan for biopsy of the bone as patient is hesitant to undergo amputation  Assessment and Plan: * Hyperglycemia due to diabetes mellitus (HCC)-resolved as of 03/19/2022 Patient has diabetes melitis and presents with significant hyperglycemia secondary to medication noncompliance Noted to have serum glucose of 634 with no evidence of metabolic acidosis Aggressive IV fluid resuscitation Start patient on Levemir 20 units daily and NovoLog sliding scale coverage Diabetic education  Hyponatremia Secondary to hyperglycemia Expect improvement in serum sodium levels following resolution of hyperglycemia.  Encourage oral hydration  Osteomyelitis (HCC) Osteomyelitis distal phalanx right great toe confirmed on MRI Patient is refusing amputation at this time but agreeable for biopsy of the bone to get culture data Will get ID consult for antibiotic management.  Patient received vancomycin and cefepime yesterday in the ED  Uncontrolled type 2 diabetes mellitus with hypoglycemia without coma (HCC) Continue insulin sliding scale.  Continue insulin Semglee once daily.  Last hemoglobin A1c was 13.9 in April 2023.  Will recheck this admission.  Diabetic nurse following  Polyarthralgia Continue methotrexate and folic acid  Essential hypertension Continue losartan        Subjective: Patient has a lot of reservation about medical care and community.  She is very clear about what she would allow and what she would not for her care.  She is preoccupied with her past medical care and blames that to her current condition.  She was  very clear that she would not allow anybody to cut her toe or PICC line but after long discussion she is willing to see ID and think about intervention if need  Physical Exam: Vitals:   03/18/22 2000 03/18/22 2208 03/19/22 0426 03/19/22 0830  BP: (!) 140/88 (!) 145/83 (!) 154/92 (!) 169/100  Pulse: 98 85 81   Resp: 16 16 20 16   Temp: 98.6 F (37 C) 98 F (36.7 C) 97.7 F (36.5 C) 97.8 F (36.6 C)  TempSrc: Oral  Oral Oral  SpO2: 98% 100% 100% 100%  Weight:      Height:       51 year old female lying in the bed comfortably without any acute distress.  She seems frustrated Lungs clear to auscultation bilaterally Cardiovascular regular rate and rhythm Abdomen soft, benign Neuro alert and awake, nonfocal Skin: See picture below  Data Reviewed:  Blood sugar running high anywhere from 300-500s, MRI confirmed osteomyelitis of distal phalanx of right great toe  Family Communication: None  Disposition: Status is: Inpatient Remains inpatient appropriate because: Management of osteomyelitis  Planned Discharge Destination: Home   DVT prophylaxis-Lovenox Time spent: 35 minutes  Author: 44, MD 03/19/2022 2:29 PM  For on call review [email or web address]/21/[email or web address].

## 2022-03-19 NOTE — Brief Op Note (Signed)
03/19/2022  6:12 PM  PATIENT:  Kaitlin Holloway  51 y.o. female  PRE-OPERATIVE DIAGNOSIS:  Osteomyelitis Right hallux  POST-OPERATIVE DIAGNOSIS:  Osteomyelitis Right hallux  PROCEDURE:  Procedure(s) with comments: RIGHT GREAT TOE BONE BIOPSY WITH WOUND DEBRIDEMENT AND GRAFT APPLICATION (Right) - Right hallux amputation  SURGEON:  Surgeon(s) and Role:    * Cabrini Ruggieri, Stephan Minister, DPM - Primary   ASSISTANTS: none   ANESTHESIA:   local and MAC  EBL:  10cc   BLOOD ADMINISTERED:none  DRAINS: none   LOCAL MEDICATIONS USED:  MARCAINE    and Amount: 10 ml  SPECIMEN:  distal phalanx right hallux  DISPOSITION OF SPECIMEN:  pathology and microbiology  COUNTS:  YES  TOURNIQUET:  none used  DICTATION: .Note written in EPIC  PLAN OF CARE: Admit to inpatient   PATIENT DISPOSITION:  PACU - hemodynamically stable.   Delay start of Pharmacological VTE agent (>24hrs) due to surgical blood loss or risk of bleeding: no

## 2022-03-19 NOTE — Progress Notes (Signed)
Initial Nutrition Assessment  DOCUMENTATION CODES:   Not applicable  INTERVENTION:   -Once diet is advanced, add:  -1 packet Juven BID, each packet provides 95 calories, 2.5 grams of protein (collagen), and 9.8 grams of carbohydrate (3 grams sugar); also contains 7 grams of L-arginine and L-glutamine, 300 mg vitamin C, 15 mg vitamin E, 1.2 mcg vitamin B-12, 9.5 mg zinc, 200 mg calcium, and 1.5 g  Calcium Beta-hydroxy-Beta-methylbutyrate to support wound healing  -MVI with minerals daily -500 mg vitamin C BID -220 mg zinc sulfate daily x 14 days -Double protein portions with meals -Referred pt to outpatient education with [facility] for further education and reinforcement -Consult to spiritual care  NUTRITION DIAGNOSIS:   Increased nutrient needs related to wound healing as evidenced by estimated needs.  GOAL:   Patient will meet greater than or equal to 90% of their needs  MONITOR:   PO intake, Supplement acceptance  REASON FOR ASSESSMENT:   Consult Assessment of nutrition requirement/status, Wound healing  ASSESSMENT:   Pt with medical history significant for diabetes mellitus, hypertension, rheumatoid arthritis, inflammatory myositis who presents for evaluation of pain and a nonhealing ulcer involving her right great toe.  Pt admitted with hyperglycemia due to DM and osteomyelitis of the rt great toe.   Reviewed I/O's: +1.8 L x 24 hours  Per podiatry note, plan for bone biopsy with debridement today. Pt is adamant refusing to forego amputation.   Spoke with pt and boyfriend at bedside. Pt reports that she needs help with DM management and has had multiple barriers to optimal control. Pt shares that she had good DM control prior to COVID pandemic; pt pt she made multiple lifestyle changes including healthy food choices, walking, and doing yoga daily- pt reports she felt very well and had a boost in her confidence and  self-image. 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 Pt also has knee trouble and developed foot wound 6 months ago when she stepped on a trash can pedal. Pt has been followed by podiatry and reports they debride the wound regularly, but it often opens up, especially when she resumes physical activity.   Pt describes herself as an emotional eater since childhood. She consumes 3 meals per day (Breakfast: cinnamon rolls, eggs and grits, or 1-2 sausage biscuits; Lunch fast food; Dinner: meat, starch, and vegetables). She and her boyfriend enjoy cooking and plan to make healthier choices; pt boyfriend has an upcoming with [facility] and pt reports she thinks she would also benefit from referral.   She reports frustration over health challenges and states that "it seems like my blood sugar runs high no matter what I do". Pt has a fear of needles, which prevents her from checking CBGs often. Per pt, she was prescribed insulin a few months ago, but did not take this, as her pen needles cost $600. Case discussed with DM coordinator regarding above to see if pt could qualify for [facility] or other affordable insulin regimen.   Discussed importance of good meal and supplement intake to promote healing. Had long discussion with pt regarding importance of good DM control to prevent further complications in addition to wound healing. Pt amenable to any and all recommendations to help prevent amputation. Per pt, she does check her feet daily. Reinforced importance of foot care. RD placed consult for spiritual care given recent health challenges.   Medications reviewed  and include folic acid.   Lab Results  Component Value Date   HGBA1C 13.9 (H) 08/03/2021   PTA DM medications are 5 units insulin aspart TID with meals and 14 units insulin glargine-yfgn daily.   Labs reviewed: CBGS: 240-398 (inpatient orders for glycemic control  are 0-20 units insulin aspart every 4 hours and 20 units insulin glargine-yfgn daily).    NUTRITION - FOCUSED PHYSICAL EXAM:  Flowsheet Row Most Recent Value  Orbital Region No depletion  Upper Arm Region No depletion  Thoracic and Lumbar Region No depletion  Buccal Region No depletion  Temple Region No depletion  Clavicle Bone Region No depletion  Clavicle and Acromion Bone Region No depletion  Scapular Bone Region No depletion  Dorsal Hand No depletion  Patellar Region No depletion  Anterior Thigh Region No depletion  Posterior Calf Region No depletion  Edema (RD Assessment) None  Hair Reviewed  Eyes Reviewed  Mouth Reviewed  Skin Reviewed  Nails Reviewed       Diet Order:   Diet Order             Diet NPO time specified Except for: Sips with Meds  Diet effective 0500                   EDUCATION NEEDS:   Education needs have been addressed  Skin:  Skin Assessment: Skin Integrity Issues: Skin Integrity Issues:: Other (Comment) Other: rt great toe DM ulcer  Last BM:  Unknown  Height:   Ht Readings from Last 1 Encounters:  03/18/22 6\' 2"  (1.88 m)    Weight:   Wt Readings from Last 1 Encounters:  03/18/22 102.1 kg    Ideal Body Weight:  77.3 kg  BMI:  Body mass index is 28.9 kg/m.  Estimated Nutritional Needs:   Kcal:  2300-2500  Protein:  115-130 grams  Fluid:  > 2 L    03/20/22, RD, LDN, CDCES Registered Dietitian II Certified Diabetes Care and Education Specialist Please refer to [facility] for RD and/or RD on-call/weekend/after hours pager

## 2022-03-19 NOTE — Progress Notes (Signed)
Pharmacy Antibiotic Note  Kaitlin Holloway is a 51 y.o. female admitted on 03/18/2022 with  diabetic foot wound and possible osteomyelitis .  Pharmacy has been consulted for vancomycin and cefepime dosing. Received vancomycin and cefepime x1 in ED 11/26 then started po doxycycline x 2 doses then held for biopsy of R great toe by podiatry evening of 11/27  Today, 03/19/2022 Renal - Scr 1.06 WBC WNL No cultures  Plan: Cefepime 2 grams IV every 8 hours Vancomycin 2gm IV x1 now then vancomycin 1250mg  IV q12h. Start at 2200 (so to be given after surgery/biopsy 11/27) Goal AUC 400-600 Predicted AUC 503 using Vd 0.72 and SCr 1.06 Follow renal function closely and check levels as needed Await culture data  Height: 6\' 2"  (188 cm) Weight: 102.1 kg (225 lb 1.4 oz) IBW/kg (Calculated) : 77.7  Temp (24hrs), Avg:97.6 F (36.4 C), Min:97.1 F (36.2 C), Max:98.1 F (36.7 C)  Recent Labs  Lab 03/18/22 1215 03/19/22 0507  WBC 7.0 5.8  CREATININE 1.00 1.06*     Estimated Creatinine Clearance: 85.8 mL/min (A) (by C-G formula based on SCr of 1.06 mg/dL (H)).    Allergies  Allergen Reactions   Aspirin Other (See Comments)    Muscle spasms in back  Other reaction(s): Muscle Pain, Other (See Comments)  Muscle spasms in back  Significant cramping and lower back pain   Penicillins Other (See Comments)    Has patient had a PCN reaction causing immediate rash, facial/tongue/throat swelling, SOB or lightheadedness with hypotension: Yes Has patient had a PCN reaction causing severe rash involving mucus membranes or skin necrosis: No Has patient had a PCN reaction that required hospitalization: No Has patient had a PCN reaction occurring within the last 10 years: No If all of the above answers are "NO", then may proceed with Cephalosporin use.    Garlic Swelling   Metformin Other (See Comments)    Other reaction(s): abdominal pain, Other (See Comments)  Severe abdominal pain  GI upset    Morphine And Related Other (See Comments)    Headaches/ migraine   Gadavist [Gadobutrol] Nausea And Vomiting    Nausea and vomiting immediately after contrast injection    Latex Rash    Gloves - when worn    Antimicrobials this admission: 11/26 >> vanco/cefepime x 1 11/26 doxycycline >> 11/27 11/27 vancomycin >>  11/27 cefepime >> Dose adjustments this admission:   Microbiology results: 11/27 toe:   Thank you for allowing pharmacy to be a part of this patient's care.  12/27, [facility]  03/19/2022 8:09 PM

## 2022-03-19 NOTE — Anesthesia Postprocedure Evaluation (Signed)
Anesthesia Post Note  Patient: [name]  Procedure(s) Performed: RIGHT GREAT TOE BONE BIOPSY WITH WOUND DEBRIDEMENT AND GRAFT APPLICATION (Right: Foot)  Patient location during evaluation: PACU Anesthesia Type: General Level of consciousness: awake and alert Pain management: pain level controlled Vital Signs Assessment: post-procedure vital signs reviewed and stable Respiratory status: spontaneous breathing, nonlabored ventilation, respiratory function stable and patient connected to nasal cannula oxygen Cardiovascular status: blood pressure returned to baseline and stable Postop Assessment: no apparent nausea or vomiting Anesthetic complications: no   No notable events documented.   Last Vitals:  Vitals:   03/19/22 1830 03/19/22 1845  BP: 117/84 110/86  Pulse: (!) 124 (!) 129  Resp: 15 17  Temp:    SpO2: 100% 97%    Last Pain:  Vitals:   03/19/22 1845  TempSrc:   PainSc: 0-No pain                 [name]

## 2022-03-19 NOTE — Progress Notes (Signed)
   03/19/22 1500  Clinical Encounter Type  Visited With Patient  Visit Type Initial  Referral From Nurse  Consult/Referral To Chaplain   Chaplain responded to consult. Chaplain provided compassionate presence and reflective listening as patient spoke about health challenges. 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 Patient appreciated Chaplain visit.

## 2022-03-19 NOTE — Progress Notes (Signed)
Patient heartrate up to 149 ST then dropped back down to 127 BPM. Sinus Tach. Notified DrJoelene Millin with anesthesia. 500 cc bolus ordered and is going in now. Patient denies pain or discomfort.

## 2022-03-19 NOTE — Consult Note (Signed)
NAME: Kaitlin Holloway  DOB: 1971-01-03  MRN: [id number]  Date/Time: 03/19/2022 1:38 PM  REQUESTING PROVIDER: Dr Kaitlin Holloway Subjective:  REASON FOR CONSULT: Rt toe osteo ? Kaitlin Holloway is Holloway 51 y.o. female with Holloway history of Dermatomyositis, rheumatoid arthritis, not on meds currently ( was taking methotrexate , steroids until April 2023) DM, HTN , chronic rt toe callus and wound followed by podiatrist presents with worsening swelling of rt great tie with some discharge on 03/18/22. She was followed as OP by Kaitlin Holloway and was on doxy and recently on cipro with not much improvement Vitals in the ED  03/18/22  BP 145/83 !  Temp 98 F (36.7 C)  Pulse Rate 85  Resp 16  SpO2 100 %  Weight 225 lb 1.4 oz    Latest Reference Range & Units 03/18/22  WBC 4.0 - 10.5 K/uL 7.0  Hemoglobin 12.0 - 15.0 g/dL 14.9  HCT 36.0 - 46.0 % 41.4  Platelets 150 - 400 K/uL 187  Creatinine 0.44 - 1.00 mg/dL 1.00    MRI of the foot showed osteo involving the distal phalanx- she received Holloway dose of vanco and cefepime  Seen by podiatrist who recommended amputation but she wanted to preserve the toe- She is going for bone biopsy /culture and debridement I am seeing the patient for antibiotic management In aug 2023 she had MSSA in wound culture Past Medical History:  Diagnosis Date   Arthritis    knee   COVID-19 06/2018   Residual joint pain   Diabetes mellitus without complication (HCC)    Type 2, does not take her meds.   Hypertension 2002   Joint pain    since having COVID 06/2018   Motion sickness    boats, car - back seat   Prediabetes 2016    Past Surgical History:  Procedure Laterality Date   ABDOMINAL HYSTERECTOMY  2016   unilateral oophorectomy.  Does not know what side.  For fibroids and heavy bleeding.   APPENDECTOMY     age 74   CATARACT EXTRACTION W/PHACO Right 01/28/2020   Procedure: CATARACT EXTRACTION PHACO AND INTRAOCULAR LENS PLACEMENT ([facility]) RIGHT VISION BLUE 13.73  01:10.8;  Surgeon: Kaitlin Meiers, MD;  Location: [facility];  Service: Ophthalmology;  Laterality: Right;  Diabetes Latex   CATARACT EXTRACTION W/PHACO Left 06/21/2020   Procedure: CATARACT EXTRACTION PHACO AND INTRAOCULAR LENS PLACEMENT ([place]) LEFT;  Surgeon: Kaitlin Robson, MD;  Location: [facility];  Service: Ophthalmology;  Laterality: Left;  18.17 1:22.4   MUSCLE BIOPSY Left 03/28/2020   Procedure: LEFT THIGH MUSCLE BIOPSY;  Surgeon: Kaitlin Morn, MD;  Location: [facility];  Service: General;  Laterality: Left;   TONSILLECTOMY      Social History   Socioeconomic History   Marital status: Single    Spouse name: Not on file   Number of children: 1   Years of education: Not on file   Highest education level: Master's degree (e.g., MA, MS, MEng, MEd, MSW, MBA)  Occupational History   Occupation: Hydrologist.  Tobacco Use   Smoking status: Former    Packs/day: 1.50    Years: 17.00    Total pack years: 25.50    Types: Cigarettes    Quit date: 07/10/2000    Years since quitting: 21.7   Smokeless tobacco: Never  Vaping Use   Vaping Use: Never used  Substance and Sexual Activity   Alcohol use: Yes    Alcohol/week: 2.0 standard drinks of alcohol  Types: 2 Cans of beer per week   Drug use: No    Comment: MJ when young   Sexual activity: Not on file  Other Topics Concern   Not on file  Social History Narrative   Lives alone, no support.Marland Kitchen   Has been hard during [place] pandemic.       Right handed   Caffeine: 1 cup coffee maybe twice Holloway week   Social Determinants of Health   Financial Resource Strain: Not on file  Food Insecurity: No Food Insecurity (03/18/2022)   Hunger Vital Sign    Worried About Running Out of Food in the Last Year: Never true    Ran Out of Food in the Last Year: Never true  Transportation Needs: No Transportation Needs (03/18/2022)   PRAPARE - Hydrologist (Medical): No    Lack of Transportation  (Non-Medical): No  Physical Activity: Not on file  Stress: Not on file  Social Connections: Not on file  Intimate Partner Violence: Not At Risk (03/18/2022)   Humiliation, Afraid, Rape, and Kick questionnaire    Fear of Current or Ex-Partner: No    Emotionally Abused: No    Physically Abused: No    Sexually Abused: No    Family History  Problem Relation Age of Onset   Heart disease Mother 67       AMI   Diabetes Mother    Hypertension Mother    Hyperlipidemia Mother    Allergies  Allergen Reactions   Aspirin Other (See Comments)    Muscle spasms in back  Other reaction(s): Muscle Pain, Other (See Comments)  Muscle spasms in back  Significant cramping and lower back pain   Penicillins Other (See Comments)    Has patient had Holloway PCN reaction causing immediate rash, facial/tongue/throat swelling, SOB or lightheadedness with hypotension: Yes Has patient had Holloway PCN reaction causing severe rash involving mucus membranes or skin necrosis: No Has patient had Holloway PCN reaction that required hospitalization: No Has patient had Holloway PCN reaction occurring within the last 10 years: No If all of the above answers are "NO", then may proceed with Cephalosporin use.    Garlic Swelling   Metformin Other (See Comments)    Other reaction(s): abdominal pain, Other (See Comments)  Severe abdominal pain  GI upset   Morphine And Related Other (See Comments)    Headaches/ migraine   Gadavist [Gadobutrol] Nausea And Vomiting    Nausea and vomiting immediately after contrast injection    Latex Rash    Gloves - when worn   I? Current Facility-Administered Medications  Medication Dose Route Frequency Provider Last Rate Last Admin   acetaminophen (TYLENOL) tablet 650 mg  650 mg Oral Q6H PRN Agbata, Tochukwu, MD   650 mg at 03/19/22 0235   Or   acetaminophen (TYLENOL) suppository 650 mg  650 mg Rectal Q6H PRN Agbata, Tochukwu, MD       cyclobenzaprine (FLEXERIL) tablet 5 mg  5 mg Oral TID PRN Kaitlin Sane, MD   5 mg at 03/19/22 1156   enoxaparin (LOVENOX) injection 40 mg  40 mg Subcutaneous Q24H Agbata, Tochukwu, MD       folic acid (FOLVITE) tablet 1 mg  1 mg Oral Daily Agbata, Tochukwu, MD   1 mg at 03/19/22 0802   insulin aspart (novoLOG) injection 0-20 Units  0-20 Units Subcutaneous TID WC Kaitlin Holloway, RPH       insulin glargine-yfgn (SEMGLEE) injection 20 Units  20 Units Subcutaneous Daily Lorin Picket, [employer]   20 Units at 03/19/22 1129   insulin starter kit- pen needles (English) 1 kit  1 kit Other Once Kaitlin Sane, MD       living well with diabetes book MISC   Does not apply Once Kaitlin Sane, MD       losartan (COZAAR) tablet 25 mg  25 mg Oral Daily Agbata, Tochukwu, MD   25 mg at 03/19/22 0854   ondansetron (ZOFRAN) tablet 4 mg  4 mg Oral Q6H PRN Agbata, Tochukwu, MD       Or   ondansetron (ZOFRAN) injection 4 mg  4 mg Intravenous Q6H PRN Agbata, Tochukwu, MD       oxyCODONE (Oxy IR/ROXICODONE) immediate release tablet 5 mg  5 mg Oral Q6H PRN Kaitlin Sane, MD   5 mg at 03/19/22 0803     Abtx:  Anti-infectives (From admission, onward)    Start     Dose/Rate Route Frequency Ordered Stop   03/18/22 2200  doxycycline (VIBRA-TABS) tablet 100 mg  Status:  Discontinued        100 mg Oral Every 12 hours 03/18/22 1555 03/19/22 1028   03/18/22 1445  ceFEPIme (MAXIPIME) 2 g in sodium chloride 0.9 % 100 mL IVPB        2 g 200 mL/hr over 30 Minutes Intravenous  Once 03/18/22 1442 03/18/22 1553   03/18/22 1445  vancomycin (VANCOCIN) IVPB 1000 mg/200 mL premix        1,000 mg 200 mL/hr over 60 Minutes Intravenous  Once 03/18/22 1442 03/18/22 1640       REVIEW OF SYSTEMS:  Const: negative fever, negative chills, negative weight loss Eyes: negative diplopia or visual changes, negative eye pain ENT: negative coryza, negative sore throat Resp: negative cough, hemoptysis, dyspnea Cards: negative for chest pain, palpitations, lower extremity edema GU: negative for frequency,  dysuria and hematuria GI: Negative for abdominal pain, diarrhea, bleeding, constipation Skin: negative for rash and pruritus Heme: negative for easy bruising and gum/nose bleeding MS: myalgias, arthralgias, and muscle weakness Neurolo:negative for headaches, dizziness, vertigo, memory problems  Psych:  anxiety, depression  Endocrine:diabetes Allergy/Immunology- as above including penicillin Objective:  VITALS:  BP (!) 169/100 (BP Location: Left Arm)   Pulse 81   Temp 97.8 F (36.6 C) (Oral)   Resp 16   Ht _0  (1.88 m)   Wt 102.1 kg   SpO2 100%   BMI 28.90 kg/m   PHYSICAL EXAM:  General: Alert, cooperative, no distress, appears stated age.  Head: Normocephalic, without obvious abnormality, atraumatic. Eyes: Conjunctivae clear, anicteric sclerae. Pupils are equal ENT Nares normal. No drainage or sinus tenderness. Lips, mucosa, and tongue normal. No Thrush Neck: Supple, symmetrical, no adenopathy, thyroid: non tender no carotid bruit and no JVD. Back: No CVA tenderness. Lungs: Clear to auscultation bilaterally. No Wheezing or Rhonchi. No rales. Heart: Regular rate and rhythm, no murmur, rub or gallop. Abdomen: Soft, non-tender,not distended. Bowel sounds normal. No masses Extremities: atraumatic, no cyanosis. No edema. No clubbing Skin: No rashes or lesions. Or bruising  Rt great tie swollen-callus on the plantar aspect Wound- dry  Lymph: Cervical, supraclavicular normal. Neurologic: Grossly non-focal Pertinent Labs Lab Results CBC    Component Value Date/Time   WBC 5.8 03/19/2022 0507   RBC 4.62 03/19/2022 0507   HGB 13.2 03/19/2022 0507   HGB 13.5 08/03/2021 0950   HCT 37.7 03/19/2022 0507   HCT 40.7 08/03/2021 0950   PLT 167  03/19/2022 0507   PLT 190 09/08/2018 0926   MCV 81.6 03/19/2022 0507   MCV 89 08/03/2021 0950   MCH 28.6 03/19/2022 0507   MCHC 35.0 03/19/2022 0507   RDW 11.6 03/19/2022 0507   RDW 14.1 08/03/2021 0950   LYMPHSABS 1.4 03/18/2022  1215   LYMPHSABS 1.1 08/03/2021 0950   MONOABS 0.4 03/18/2022 1215   EOSABS 0.5 03/18/2022 1215   EOSABS 0.6 (H) 08/03/2021 0950   BASOSABS 0.0 03/18/2022 1215   BASOSABS 0.0 08/03/2021 0950       Latest Ref Rng & Units 03/19/2022    5:07 AM 03/18/2022   12:15 PM 08/15/2021    3:29 PM  CMP  Glucose 70 - 99 mg/dL 549  634  377   BUN 6 - 20 mg/dL 31  33  21   Creatinine 0.44 - 1.00 mg/dL 1.06  1.00  1.01   Sodium 135 - 145 mmol/L 133  130  138   Potassium 3.5 - 5.1 mmol/L 4.7  4.2  4.7   Chloride 98 - 111 mmol/L 103  100  101   CO2 22 - 32 mmol/L _0 Calcium 8.9 - 10.3 mg/dL 8.5  8.8  9.9   Total Protein 6.5 - 8.1 g/dL  6.8    Total Bilirubin 0.3 - 1.2 mg/dL  0.7    Alkaline Phos 38 - 126 U/L  228    AST 15 - 41 U/L  20    ALT 0 - 44 U/L  24        Microbiology: None available  IMAGING RESULTS: MRI foot- rt great toe- distal phalanx osteo I have personally reviewed the films ? Impression/Recommendation ? Rt toe wound since April 2023- diabetic foot infection Osteomyelitis of the great toe After getting bone biopsy and debridement she will be started on IV vanco Will decide on duration and route depending on pathology and culture  Rheumatoid arthritis Dermatomyositis Off methotrexate since April 2023   HTN Poorly controlled DM ? ?Discussed the management with patient and care team ___________________________________________________ Discussed with patient, requesting provider Note:  This document was prepared using Dragon voice recognition software and may include unintentional dictation errors.

## 2022-03-19 NOTE — Progress Notes (Signed)
       CROSS COVER NOTE  NAME: Kaitlin Holloway MRN: [id number] DOB : [birth year] ATTENDING PHYSICIAN: Delfino Lovett, MD    Date of Service   03/19/2022   HPI/Events of Note   Notified of Critical lab value --> Glucose 549.  Interventions   Assessment/Plan:  10U Novolog     This document was prepared using Conservation officer, historic buildings and may include unintentional dictation errors.  Bishop Limbo DNP, MBA, FNP-BC Nurse Practitioner [facility] Pager [phone]

## 2022-03-19 NOTE — Anesthesia Preprocedure Evaluation (Signed)
Anesthesia Evaluation  Patient identified by MRN, date of birth, ID band Patient awake    Reviewed: Allergy & Precautions, NPO status , Patient's Chart, lab work & pertinent test results  History of Anesthesia Complications Negative for: history of anesthetic complications  Airway Mallampati: II  TM Distance: >3 FB Neck ROM: full    Dental  (+) Teeth Intact   Pulmonary neg pulmonary ROS, former smoker   Pulmonary exam normal        Cardiovascular hypertension, On Medications negative cardio ROS Normal cardiovascular exam     Neuro/Psych  PSYCHIATRIC DISORDERS Anxiety      Neuromuscular disease    GI/Hepatic negative GI ROS, Neg liver ROS,,,  Endo/Other  negative endocrine ROSdiabetes, Poorly Controlled, Type 2    Renal/GU negative Renal ROS  negative genitourinary   Musculoskeletal  (+) Arthritis , Rheumatoid disorders,  Inflammatory myositis    Abdominal   Peds  Hematology negative hematology ROS (+)   Anesthesia Other Findings Past Medical History: No date: Arthritis     Comment:  knee 06/2018: COVID-19     Comment:  Residual joint pain No date: Diabetes mellitus without complication (HCC)     Comment:  Type 2, does not take her meds. 2002: Hypertension No date: Joint pain     Comment:  since having COVID 06/2018 No date: Motion sickness     Comment:  boats, car - back seat 2016: Prediabetes  Past Surgical History: 2016: ABDOMINAL HYSTERECTOMY     Comment:  unilateral oophorectomy.  Does not know what side.  For               fibroids and heavy bleeding. No date: APPENDECTOMY     Comment:  age 56 01/28/2020: CATARACT EXTRACTION W/PHACO; Right     Comment:  Procedure: CATARACT EXTRACTION PHACO AND INTRAOCULAR               LENS PLACEMENT ([facility]) RIGHT VISION BLUE 13.73  01:10.8;                Surgeon: Elliot Cousin, MD;  [facility]: [facility];  Service: Ophthalmology;  Laterality:  Right;                Diabetes Latex 06/21/2020: CATARACT EXTRACTION W/PHACO; Left     Comment:  Procedure: CATARACT EXTRACTION PHACO AND INTRAOCULAR               LENS PLACEMENT ([facility]) LEFT;  Surgeon: Galen Manila,               MD;  Location: [facility];  Service:               Ophthalmology;  Laterality: Left;  18.17 1:22.4 03/28/2020: MUSCLE BIOPSY; Left     Comment:  Procedure: LEFT THIGH MUSCLE BIOPSY;  Surgeon:               Quentin Ore, MD;  Location: [facility];  Service:               General;  Laterality: Left; No date: TONSILLECTOMY  BMI    Body Mass Index: 28.90 kg/m      Reproductive/Obstetrics negative OB ROS                             Anesthesia Physical Anesthesia Plan  ASA: 3  Anesthesia  Plan: General   Post-op Pain Management: Minimal or no pain anticipated   Induction: Intravenous  PONV Risk Score and Plan: Propofol infusion and TIVA  Airway Management Planned: Natural Airway and Nasal Cannula  Additional Equipment:   Intra-op Plan:   Post-operative Plan:   Informed Consent: I have reviewed the patients History and Physical, chart, labs and discussed the procedure including the risks, benefits and alternatives for the proposed anesthesia with the patient or authorized representative who has indicated his/her understanding and acceptance.     Dental Advisory Given  Plan Discussed with: Anesthesiologist, CRNA and Surgeon  Anesthesia Plan Comments: (Patient consented for risks of anesthesia including but not limited to:  - adverse reactions to medications - risk of airway placement if required - damage to eyes, teeth, lips or other oral mucosa - nerve damage due to positioning  - sore throat or hoarseness - Damage to heart, brain, nerves, lungs, other parts of body or loss of life  Patient voiced understanding.)        Anesthesia Quick Evaluation

## 2022-03-20 ENCOUNTER — Encounter: Payer: Self-pay | Admitting: Podiatry

## 2022-03-20 DIAGNOSIS — E1165 Type 2 diabetes mellitus with hyperglycemia: Secondary | ICD-10-CM | POA: Diagnosis not present

## 2022-03-20 DIAGNOSIS — E871 Hypo-osmolality and hyponatremia: Secondary | ICD-10-CM | POA: Diagnosis not present

## 2022-03-20 DIAGNOSIS — M8618 Other acute osteomyelitis, other site: Secondary | ICD-10-CM | POA: Diagnosis not present

## 2022-03-20 DIAGNOSIS — M86171 Other acute osteomyelitis, right ankle and foot: Secondary | ICD-10-CM | POA: Diagnosis not present

## 2022-03-20 DIAGNOSIS — E11628 Type 2 diabetes mellitus with other skin complications: Secondary | ICD-10-CM | POA: Diagnosis not present

## 2022-03-20 DIAGNOSIS — I10 Essential (primary) hypertension: Secondary | ICD-10-CM | POA: Diagnosis not present

## 2022-03-20 DIAGNOSIS — L089 Local infection of the skin and subcutaneous tissue, unspecified: Secondary | ICD-10-CM

## 2022-03-20 DIAGNOSIS — E11649 Type 2 diabetes mellitus with hypoglycemia without coma: Secondary | ICD-10-CM | POA: Diagnosis not present

## 2022-03-20 LAB — GLUCOSE, CAPILLARY
Glucose-Capillary: 230 mg/dL — ABNORMAL HIGH (ref 70–99)
Glucose-Capillary: 244 mg/dL — ABNORMAL HIGH (ref 70–99)
Glucose-Capillary: 262 mg/dL — ABNORMAL HIGH (ref 70–99)
Glucose-Capillary: 310 mg/dL — ABNORMAL HIGH (ref 70–99)
Glucose-Capillary: 359 mg/dL — ABNORMAL HIGH (ref 70–99)
Glucose-Capillary: 468 mg/dL — ABNORMAL HIGH (ref 70–99)

## 2022-03-20 LAB — CBC
HCT: 38.8 % (ref 36.0–46.0)
Hemoglobin: 13.4 g/dL (ref 12.0–15.0)
MCH: 28.6 pg (ref 26.0–34.0)
MCHC: 34.5 g/dL (ref 30.0–36.0)
MCV: 82.9 fL (ref 80.0–100.0)
Platelets: 176 10*3/uL (ref 150–400)
RBC: 4.68 MIL/uL (ref 3.87–5.11)
RDW: 11.8 % (ref 11.5–15.5)
WBC: 7.4 10*3/uL (ref 4.0–10.5)
nRBC: 0 % (ref 0.0–0.2)

## 2022-03-20 LAB — BASIC METABOLIC PANEL
Anion gap: 5 (ref 5–15)
BUN: 26 mg/dL — ABNORMAL HIGH (ref 6–20)
CO2: 25 mmol/L (ref 22–32)
Calcium: 8.2 mg/dL — ABNORMAL LOW (ref 8.9–10.3)
Chloride: 105 mmol/L (ref 98–111)
Creatinine, Ser: 1.02 mg/dL — ABNORMAL HIGH (ref 0.44–1.00)
GFR, Estimated: >60 mL/min (ref >60)
Glucose, Bld: 437 mg/dL — ABNORMAL HIGH (ref 70–99)
Potassium: 4.7 mmol/L (ref 3.5–5.1)
Sodium: 135 mmol/L (ref 135–145)

## 2022-03-20 LAB — GLUCOSE, RANDOM: Glucose, Bld: 515 mg/dL (ref 70–99)

## 2022-03-20 MED ORDER — OXYCODONE HCL 5 MG PO TABS
5.0000 mg | ORAL_TABLET | Freq: Once | ORAL | Status: AC
  Administered 2022-03-20: 5 mg via ORAL
  Filled 2022-03-20: qty 1

## 2022-03-20 MED ORDER — OXYCODONE HCL 5 MG PO TABS
5.0000 mg | ORAL_TABLET | ORAL | Status: DC | PRN
  Administered 2022-03-20 – 2022-03-22 (×6): 5 mg via ORAL
  Filled 2022-03-20 (×6): qty 1

## 2022-03-20 MED ORDER — INSULIN ASPART 100 UNIT/ML IJ SOLN
0.0000 [IU] | Freq: Three times a day (TID) | INTRAMUSCULAR | Status: DC
  Administered 2022-03-20: 11 [IU] via SUBCUTANEOUS
  Administered 2022-03-20: 15 [IU] via SUBCUTANEOUS
  Administered 2022-03-21: 7 [IU] via SUBCUTANEOUS
  Administered 2022-03-21 – 2022-03-22 (×3): 20 [IU] via SUBCUTANEOUS
  Filled 2022-03-20 (×6): qty 1

## 2022-03-20 MED ORDER — METRONIDAZOLE 500 MG/100ML IV SOLN
500.0000 mg | Freq: Two times a day (BID) | INTRAVENOUS | Status: DC
  Administered 2022-03-20 – 2022-03-21 (×4): 500 mg via INTRAVENOUS
  Filled 2022-03-20 (×5): qty 100

## 2022-03-20 MED ORDER — INSULIN ASPART 100 UNIT/ML IJ SOLN
0.0000 [IU] | Freq: Every day | INTRAMUSCULAR | Status: DC
  Administered 2022-03-20 – 2022-03-21 (×2): 2 [IU] via SUBCUTANEOUS
  Filled 2022-03-20 (×2): qty 1

## 2022-03-20 MED ORDER — INSULIN ASPART 100 UNIT/ML IJ SOLN
5.0000 [IU] | Freq: Three times a day (TID) | INTRAMUSCULAR | Status: DC
  Administered 2022-03-20 – 2022-03-21 (×4): 5 [IU] via SUBCUTANEOUS
  Filled 2022-03-20 (×4): qty 1

## 2022-03-20 NOTE — Plan of Care (Signed)

## 2022-03-20 NOTE — Assessment & Plan Note (Signed)
Continue methotrexate and folic acid 

## 2022-03-20 NOTE — Assessment & Plan Note (Signed)
Continue insulin sliding scale.  Continue insulin Semglee once daily.  Last hemoglobin A1c was 13.9 in April 2023.  Diabetic nurse following.  Now that patient is eating adding NovoLog 5 units 3 times daily for meal coverage

## 2022-03-20 NOTE — Progress Notes (Signed)
Mobility Specialist - Progress Note   03/20/22 1000  Mobility  Activity Ambulated independently in room  Level of Assistance Independent  Assistive Device None  Distance Ambulated (ft) 40 ft  Activity Response Tolerated well  $Mobility charge 1 Mobility     Pt sitting EOB on arrival, utilizing RA. Pt reports mild pain in R foot extending towards inner ankle, but agreeable to activity. Pt independent with all transfers and ambulation without AD. Pt returned supine with needs in reach.    Filiberto Pinks Mobility Specialist 03/20/22, 10:56 AM

## 2022-03-20 NOTE — Assessment & Plan Note (Signed)
Osteomyelitis distal phalanx right great toe confirmed on MRI S/p bone biopsy ID input appreciated.  Continue cefepime, Flagyl, vancomycin

## 2022-03-20 NOTE — Assessment & Plan Note (Signed)
Secondary to hyperglycemia Now resolved

## 2022-03-20 NOTE — Progress Notes (Signed)
Subjective: 1 Day Post-Op Procedure(s) (LRB): RIGHT GREAT TOE BONE BIOPSY WITH WOUND DEBRIDEMENT AND GRAFT APPLICATION (Right) Patient reports pain as {pain:3041404}.    Objective: Vital signs in last 24 hours: Temp:  [97.7 F (36.5 C)-99.5 F (37.5 C)] 99.5 F (37.5 C) (11/28 1945) Pulse Rate:  [85-99] 99 (11/28 1945) Resp:  [18-20] 20 (11/28 1945) BP: (118-144)/(71-88) 141/78 (11/28 1945) SpO2:  [97 %-99 %] 98 % (11/28 1945)  Intake/Output from previous day: 11/27 0701 - 11/28 0700 In: 1218.3 [P.O.:118; I.V.:500; IV Piggyback:600.3] Out: -  Intake/Output this shift: No intake/output data recorded.  Recent Labs    03/18/22 1215 03/19/22 0507 03/20/22 0343  HGB 14.9 13.2 13.4   Recent Labs    03/19/22 0507 03/20/22 0343  WBC 5.8 7.4  RBC 4.62 4.68  HCT 37.7 38.8  PLT 167 176   Recent Labs    03/19/22 0507 03/20/22 0053 03/20/22 0343  NA 133*  --  135  K 4.7  --  4.7  CL 103  --  105  CO2 22  --  25  BUN 31*  --  26*  CREATININE 1.06*  --  1.02*  GLUCOSE 549* 515* 437*  CALCIUM 8.5*  --  8.2*   No results for input(s): "LABPT", "INR" in the last 72 hours.  {physical exam:3041401}   Assessment/Plan: 1 Day Post-Op Procedure(s) (LRB): RIGHT GREAT TOE BONE BIOPSY WITH WOUND DEBRIDEMENT AND GRAFT APPLICATION (Right) {TDHR:4163845}   {ORTHOADMISSIONSTATUS:21269}   Felecia Shelling 03/20/2022, 10:02 PM

## 2022-03-20 NOTE — Progress Notes (Signed)
ID Pt doing okay Last evening had surgery on the rt great toe and today she stepped on the foot by mistake and it started to bleed and the nurse put an extra layer of dressing  O/e awake and alert Rt arm cubital foss IV line site swollen and erythematous Chest CTA [id number] Rt foot dressing not removed ( to be changed by surgeon as it is still 1st op dressing)  Labs    Latest Ref Rng & Units 03/20/2022    3:43 AM 03/19/2022    5:07 AM 03/18/2022   12:15 PM  CBC  WBC 4.0 - 10.5 K/uL 7.4  5.8  7.0   Hemoglobin 12.0 - 15.0 g/dL 13.2  44.0  10.2   Hematocrit 36.0 - 46.0 % 38.8  37.7  41.4   Platelets 150 - 400 K/uL 176  167  187        Latest Ref Rng & Units 03/20/2022    3:43 AM 03/20/2022   12:53 AM 03/19/2022    5:07 AM  CMP  Glucose 70 - 99 mg/dL 725  366  440   BUN 6 - 20 mg/dL 26   31   Creatinine 3.47 - 1.00 mg/dL 4.25   9.56   Sodium 387 - 145 mmol/L 135   133   Potassium 3.5 - 5.1 mmol/L 4.7   4.7   Chloride 98 - 111 mmol/L 105   103   CO2 22 - 32 mmol/L 25   22   Calcium 8.9 - 10.3 mg/dL 8.2   8.5      Micro Rt toe culture pending  Impression/recommendation  Diabetic foot infection poorly controlled DM Rt great toe wound since April 2023 S/p debridement MRI was concerning for distal phalnx osteo- pt wanted to preserve the toe Await culture and biopsy On triple antibiotic Will decide on duration/route of antibiotic once all the tests are resullted  Rheumatoid arthritis Dermatomyositis Not on any Rx since April   HTN

## 2022-03-20 NOTE — Progress Notes (Signed)
At bedside, administering insulin. When asked if she uses insulin at home, patient replies "Well I was supposed to, but I didn't." Patient then goes into a long explanation of how "nothing (she) did made (her) diabetic." Patient expresses frustration and anger regarding her diagnosis. Patient states she was previously in "denial" but is now "just mad." Allowed patient to vent frustrations and grieve for her loss of the view she had for herself as an empty nestor. 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

## 2022-03-20 NOTE — Inpatient Diabetes Management (Addendum)
Inpatient Diabetes Program Recommendations  AACE/ADA: New Consensus Statement on Inpatient Glycemic Control (2015)  Target Ranges:  Prepandial:   less than 140 mg/dL      Peak postprandial:   less than 180 mg/dL (1-2 hours)      Critically ill patients:  140 - 180 mg/dL   Lab Results  Component Value Date   GLUCAP 244 (H) 03/20/2022   HGBA1C 13.9 (H) 08/03/2021    Review of Glycemic Control  Diabetes history: DM2 Outpatient Diabetes medications: Listed as not taking Semglee 14 units qd, Novolog 5 units tid meal coverage Current orders for Inpatient glycemic control: Semglee 20 units, Novolog 0-20 units q 4 hrs.  Inpatient Diabetes Program Recommendations:   Since patient is now eating, please consider: -Add Novolog 5 units tid meal coverage if eats 50% -Change Novolog correction scale to 0-20 units tid, 0-5 units hs  Thank you, Darel Hong E. Detta Mellin, RN, MSN, CDE  Diabetes Coordinator Inpatient Glycemic Control Team Team Pager [phone] (8am-5pm) 03/20/2022 10:29 AM

## 2022-03-20 NOTE — Progress Notes (Signed)
  Progress Note   Patient: Kaitlin Holloway DDU:[id number] DOB: 09/14/1970 DOA: 03/18/2022     2 DOS: the patient was seen and examined on 03/20/2022   Brief hospital course: 50 year old female with a known history of diabetes, hypertension, rheumatoid arthritis, inflammatory myositis is admitted for right great toe distal phalanx osteomyelitis  11/27: Podiatry and ID consult.  Status post bone biopsy of right hallux 11/28: Added NovoLog 5 units 3 times daily and adjusted sliding scale per diabetic nurse recommendation   Assessment and Plan: * Hyperglycemia due to diabetes mellitus (HCC)-resolved as of 03/19/2022 Patient has diabetes melitis and presents with significant hyperglycemia secondary to medication noncompliance Noted to have serum glucose of 634 with no evidence of metabolic acidosis Aggressive IV fluid resuscitation Start patient on Levemir 20 units daily and NovoLog sliding scale coverage Diabetic education  Hyponatremia Secondary to hyperglycemia Now resolved  Osteomyelitis (HCC) Osteomyelitis distal phalanx right great toe confirmed on MRI S/p bone biopsy ID input appreciated.  Continue cefepime, Flagyl, vancomycin  Uncontrolled type 2 diabetes mellitus with hypoglycemia without coma (HCC) Continue insulin sliding scale.  Continue insulin Semglee once daily.  Last hemoglobin A1c was 13.9 in April 2023.  Diabetic nurse following.  Now that patient is eating adding NovoLog 5 units 3 times daily for meal coverage  Polyarthralgia Continue methotrexate and folic acid  Essential hypertension Continue losartan        Subjective: No new issues.  Blood sugar improving.  Pain control can be a challenge at times  Physical Exam: Vitals:   03/19/22 1915 03/19/22 2023 03/20/22 0439 03/20/22 0748  BP: 130/80 135/80 135/81 118/71  Pulse: 76 82 91 85  Resp: 18 17 20 19   Temp: (!) 97.1 F (36.2 C) 98 F (36.7 C) 97.7 F (36.5 C) 98 F (36.7 C)  TempSrc:  Oral Oral    SpO2: 97% 98% 97% 99%  Weight:      Height:       51 year old female lying in the bed comfortably without any acute distress.  She seems frustrated Lungs clear to auscultation bilaterally Cardiovascular regular rate and rhythm Abdomen soft, benign Neuro alert and awake, nonfocal Skin: Right great toe dressing present Data Reviewed:  There are no new results to review at this time.  Family Communication: None  Disposition: Status is: Inpatient Remains inpatient appropriate because: Management of osteomyelitis  Planned Discharge Destination: Home with Home Health   DVT prophylaxis-Lovenox Time spent: 35 minutes  Author: 43, MD 03/20/2022 12:05 PM  For on call review [email or web address].

## 2022-03-20 NOTE — Progress Notes (Signed)
       CROSS COVER NOTE  NAME: Kaitlin Holloway MRN: [id number] DOB : 07-May-1970 ATTENDING PHYSICIAN: Delfino Lovett, MD    Date of Service   03/20/2022   HPI/Events of Note   Notified of elevated blood glucose --> 515, increased from 143 at 1958. AM labs drawn early and M(r)s Lorenz does not have an anion gap.    Patient is also reporting 8/10 pain at debridement site, oxycodone was effective but has worn off.  Interventions   Assessment/Plan:  20 U Novolog Draw AM labs early--> Anion gap 5  Surgical Pain Oxycodone x1      This document was prepared using Dragon voice recognition software and may include unintentional dictation errors.  Bishop Limbo DNP, MBA, FNP-BC Nurse Practitioner [facility] Pager [phone]

## 2022-03-21 ENCOUNTER — Encounter: Payer: Self-pay | Admitting: Podiatry

## 2022-03-21 DIAGNOSIS — E1165 Type 2 diabetes mellitus with hyperglycemia: Secondary | ICD-10-CM | POA: Diagnosis not present

## 2022-03-21 DIAGNOSIS — M86171 Other acute osteomyelitis, right ankle and foot: Secondary | ICD-10-CM | POA: Diagnosis not present

## 2022-03-21 DIAGNOSIS — E11649 Type 2 diabetes mellitus with hypoglycemia without coma: Secondary | ICD-10-CM | POA: Diagnosis not present

## 2022-03-21 LAB — C-REACTIVE PROTEIN: CRP: 1 mg/dL — ABNORMAL HIGH (ref <1.0)

## 2022-03-21 LAB — GLUCOSE, CAPILLARY
Glucose-Capillary: 422 mg/dL — ABNORMAL HIGH (ref 70–99)
Glucose-Capillary: 405 mg/dL — ABNORMAL HIGH (ref 70–99)
Glucose-Capillary: 227 mg/dL — ABNORMAL HIGH (ref 70–99)
Glucose-Capillary: 207 mg/dL — ABNORMAL HIGH (ref 70–99)

## 2022-03-21 LAB — HEMOGLOBIN A1C
Hgb A1c MFr Bld: 15.2 % — ABNORMAL HIGH (ref 4.8–5.6)
Mean Plasma Glucose: 390 mg/dL

## 2022-03-21 LAB — SURGICAL PATHOLOGY

## 2022-03-21 LAB — SEDIMENTATION RATE: Sed Rate: 10 mm/hr (ref 0–30)

## 2022-03-21 MED ORDER — INSULIN GLARGINE-YFGN 100 UNIT/ML ~~LOC~~ SOLN
15.0000 [IU] | Freq: Once | SUBCUTANEOUS | Status: AC
  Administered 2022-03-21: 15 [IU] via SUBCUTANEOUS
  Filled 2022-03-21: qty 0.15

## 2022-03-21 MED ORDER — INSULIN GLARGINE-YFGN 100 UNIT/ML ~~LOC~~ SOLN
30.0000 [IU] | Freq: Every day | SUBCUTANEOUS | Status: DC
  Administered 2022-03-22: 30 [IU] via SUBCUTANEOUS
  Filled 2022-03-21: qty 0.3

## 2022-03-21 MED ORDER — INSULIN ASPART 100 UNIT/ML IJ SOLN
12.0000 [IU] | Freq: Three times a day (TID) | INTRAMUSCULAR | Status: DC
  Administered 2022-03-21 – 2022-03-22 (×2): 12 [IU] via SUBCUTANEOUS
  Filled 2022-03-21: qty 1

## 2022-03-21 MED ORDER — SODIUM CHLORIDE 0.9 % IV SOLN
2.0000 g | Freq: Three times a day (TID) | INTRAVENOUS | Status: DC
  Administered 2022-03-21 – 2022-03-22 (×4): 2 g via INTRAVENOUS
  Filled 2022-03-21: qty 2
  Filled 2022-03-21: qty 12.5
  Filled 2022-03-21: qty 2
  Filled 2022-03-21 (×2): qty 12.5

## 2022-03-21 NOTE — Assessment & Plan Note (Signed)
See osteomyelitis 

## 2022-03-21 NOTE — Assessment & Plan Note (Addendum)
Patient reports diagnosis of rheumatoid arthritis. Continue methotrexate and folic acid

## 2022-03-21 NOTE — Assessment & Plan Note (Signed)
Continue insulin sliding scale.  Continue insulin Semglee once daily.  Last hemoglobin A1c was 13.9 in April 2023.   11/29: CBGs above 400 today -- Appreciate diabetes coordinator input and patient education --Increase Semglee to 30 units daily -- One-time additional 15 units Semglee today --Increase scheduled mealtime NovoLog to 12 units --Continue resistant sliding scale NovoLog

## 2022-03-21 NOTE — Progress Notes (Addendum)
Pharmacy Antibiotic Note  Kaitlin Holloway is a 51 y.o. female admitted on 03/18/2022 with  diabetic foot wound and possible osteomyelitis .  Pharmacy has been consulted for vancomycin and cefepime dosing. Received vancomycin and cefepime x1 in ED 11/26 then started po doxycycline x 2 doses then held for biopsy of R great toe by podiatry evening of 11/27  Today, 03/21/2022 Renal - Scr 1.02 WBC WNL Bone cx growing GPC  Plan: Day #2 of vancomycin Continue vancomycin 1250mg  IV q12h Goal AUC 400-550  Est AUC: 485.2; Cmax: 29.9; Cmin: 14.3 SCr 1.02; IBW; Vd 0.72 Continue cefepime 2 g IV q8H Follow renal function closely and check levels as needed Await culture data  Continue metronidazole as part of the above plan  Height: 6\' 2"  (188 cm) Weight: 102.1 kg (225 lb 1.4 oz) IBW/kg (Calculated) : 77.7  Temp (24hrs), Avg:98.5 F (36.9 C), Min:98 F (36.7 C), Max:99.5 F (37.5 C)  Recent Labs  Lab 03/18/22 1215 03/19/22 0507 03/20/22 0343  WBC 7.0 5.8 7.4  CREATININE 1.00 1.06* 1.02*     Estimated Creatinine Clearance: 89.1 mL/min (A) (by C-G formula based on SCr of 1.02 mg/dL (H)).    Allergies  Allergen Reactions   Aspirin Other (See Comments)    Muscle spasms in back  Other reaction(s): Muscle Pain, Other (See Comments)  Muscle spasms in back  Significant cramping and lower back pain   Penicillins Other (See Comments)    Has patient had a PCN reaction causing immediate rash, facial/tongue/throat swelling, SOB or lightheadedness with hypotension: Yes Has patient had a PCN reaction causing severe rash involving mucus membranes or skin necrosis: No Has patient had a PCN reaction that required hospitalization: No Has patient had a PCN reaction occurring within the last 10 years: No If all of the above answers are "NO", then may proceed with Cephalosporin use.    Garlic Swelling   Metformin Other (See Comments)    Other reaction(s): abdominal pain, Other (See Comments)   Severe abdominal pain  GI upset   Morphine And Related Other (See Comments)    Headaches/ migraine   Gadavist [Gadobutrol] Nausea And Vomiting    Nausea and vomiting immediately after contrast injection    Latex Rash    Gloves - when worn    Antimicrobials this admission: 11/26 >> vanco/cefepime x 1 11/26 doxycycline >> 11/27 11/27 vancomycin >>  11/28 cefepime >> 11/28 metronidazole >>  Dose adjustments this admission: N/A  Microbiology results: 11/27 toe: GPC  Thank you for allowing pharmacy to be a part of this patient's care.  Will M. 12/28, PharmD PGY-1 Pharmacy Resident 03/21/2022 12:09 PM

## 2022-03-21 NOTE — Progress Notes (Signed)
Progress Note   Patient: Kaitlin Holloway YHC:[id number] DOB: Sep 19, 1970 DOA: 03/18/2022     3 DOS: the patient was seen and examined on 03/21/2022   Brief hospital course: 51 year old female with a known history of diabetes, hypertension, rheumatoid arthritis, inflammatory myositis is admitted for right great toe distal phalanx osteomyelitis  11/27: Podiatry and ID consult.  Status post bone biopsy of right hallux 11/28: Added NovoLog 5 units 3 times daily and adjusted sliding scale per diabetic nurse recommendation 11/29: Increased Semglee and Novolog. Sugars > 400.  Cultures still pending for definitive antibiotic plan.   Assessment and Plan: * Hyperglycemia due to diabetes mellitus (HCC)-resolved as of 03/19/2022 Patient has diabetes melitis and presents with significant hyperglycemia secondary to medication noncompliance Noted to have serum glucose of 634 with no evidence of metabolic acidosis Aggressive IV fluid resuscitation Start patient on Levemir 20 units daily and NovoLog sliding scale coverage Diabetic education  Toe infection See osteomyelitis  Diabetic foot infection (HCC) See osteomyelitis  Hyponatremia Secondary to hyperglycemia Now resolved  Osteomyelitis (HCC) Osteomyelitis distal phalanx right great toe confirmed on MRI S/p bone biopsy ID input appreciated.   Continue cefepime, Flagyl, vancomycin Follow bone culture, growing GPC's.  Uncontrolled type 2 diabetes mellitus with hypoglycemia without coma (HCC) Continue insulin sliding scale.  Continue insulin Semglee once daily.  Last hemoglobin A1c was 13.9 in April 2023.   11/29: CBGs above 400 today -- Appreciate diabetes coordinator input and patient education --Increase Semglee to 30 units daily -- One-time additional 15 units Semglee today --Increase scheduled mealtime NovoLog to 12 units --Continue resistant sliding scale NovoLog   Polyarthralgia Patient reports diagnosis of rheumatoid  arthritis. Continue methotrexate and folic acid  Essential hypertension Continue losartan         Subjective: Patient seen at bedside this morning.  Her boyfriend was present sleeping in the recliner.  She reports ongoing frustration over her general health situation.  She was diagnosed as prediabetes about 2 years ago and feels she has progressed to uncontrolled diabetes due to medications.  States she was given prednisone for rheumatoid arthritis.  Reports intermittent discharge from her left ear that began after having COVID few years ago.  Denies any ear pain or difficulty hearing.  She asked to have this cultured.  States she has been told in the past that this is just earwax but says that is a thin yellowish liquid.   Physical Exam: Vitals:   03/20/22 1604 03/20/22 1945 03/21/22 0413 03/21/22 0810  BP: (!) 144/88 (!) 141/78 (!) 144/70 125/80  Pulse: 91 99 90 81  Resp: 18 20 18 14   Temp: 98 F (36.7 C) 99.5 F (37.5 C) 98 F (36.7 C) 98.6 F (37 C)  TempSrc:  Oral Oral   SpO2: 99% 98% 98% 98%  Weight:      Height:       General exam: awake, alert, no acute distress HEENT: moist mucus membranes, hearing grossly normal  Respiratory system: on room air, normal respiratory effort. Cardiovascular system: RRR, intact pedal pulse.   Central nervous system: A&O x4. no gross focal neurologic deficits, normal speech Extremities: Clean dry intact dressing right foot, no edema, normal tone Skin: dry, intact, normal temperature Psychiatry: normal mood, congruent affect, expresses frustration, judgement and insight appear normal    Data Reviewed:  CBGs remain uncontrolled: 422, 405 today.  CRP 1.0. Last CBC was within normal limits on 11/28. Last BMP 11/28 with creatinine 1.02, normal electrolytes, hyperglycemia.  Micro -tissue path  reviewed from bone biopsy 11/27 -growing gram-positive cocci    Family Communication: None. Boyfriend at bedside sleeping during  encounter.  Disposition: Status is: Inpatient Remains inpatient appropriate because: Management of osteomyelitis, remains on empiric IV antibiotic pending culture results.   Planned Discharge Destination: Home with Home Health   DVT prophylaxis-Lovenox  Time spent: 35 minutes  Author: Pennie Banter, DO 03/21/2022 12:39 PM  For on call review [email or web address].

## 2022-03-21 NOTE — TOC Progression Note (Signed)
Transition of Care [facility]) - Progression Note    Patient Details  Name: Omni Dunsworth MRN: [id number] Date of Birth: 1971/01/27  [facility] of [facility]) CM/SW Contact  Tempie Hoist, [place] Phone Number: 03/21/2022, 12:01 PM  Clinical Narrative:     The patient has DME in the home including toilet seat, rails, sharps, glucose monitor and a scale.  The patient will need to discharge home with a HHRN. Patient has [employer]. TOC to find HHRN in [place], [place].         Expected Discharge Plan and Services        [facility] in [place]  Living arrangements for the past 2 months: Single Family Home                                       Social Determinants of Health (SDOH) Interventions    Readmission Risk Interventions     No data to display

## 2022-03-21 NOTE — Assessment & Plan Note (Signed)
Osteomyelitis distal phalanx right great toe confirmed on MRI S/p bone biopsy ID input appreciated.   Treated with empiric IV broad spectrum antibiotics - cefepime, Flagyl, vancomycin Bone culture no growth. ID recommends 2 weeks oral Duricef. Pt to continue daily dressing changes, RN did dressing change with patient this AM and she is comfortable managing at home. Podiatry follow up in 1 week.

## 2022-03-21 NOTE — Inpatient Diabetes Management (Signed)
Inpatient Diabetes Program Recommendations  AACE/ADA: New Consensus Statement on Inpatient Glycemic Control (2015)  Target Ranges:  Prepandial:   less than 140 mg/dL      Peak postprandial:   less than 180 mg/dL (1-2 hours)      Critically ill patients:  140 - 180 mg/dL    Latest Reference Range & Units 03/20/22 00:30 03/20/22 04:29 03/20/22 07:45 03/20/22 11:37 03/20/22 16:02 03/20/22 19:55  Glucose-Capillary 70 - 99 mg/dL 481 (H)  20 units Novolog @0149  359 (H)  20 units Novolog  244 (H)  7 units Novolog  20 units Semglee @0930   310 (H)  20 units Novolog  262 (H)  16 units Novolog  230 (H)  2 units Novolog @2107   (H): Data is abnormally high  Latest Reference Range & Units 03/21/22 08:12  Glucose-Capillary 70 - 99 mg/dL (H)  25 units Novolog  20 units Semglee  (H): Data is abnormally high     Home DM Meds: Novolog 5 units TID (NOT taking)       Semglee 14 units Daily (NOT taking)   Current Orders: Novolog Resistant Correction Scale/ SSI (0-20 units) TID AC + HS     Novolog 5 units TID with meals     Semglee 20 units Daily    Per notes from Diabetes RN on 11/27, pt was NOT using Insulin at home due to not having Insulin pen needles and thinking the needles were $600.  Diabetes RN clarified with [employer] and needles should be $9 per box which was shared with the pt.  Will need the following Rxs at time of discharge, as pt was NOT storing her insulin in the fridge and needs all new Rxs when she goes home: 03/23/22 flexpen 856 -Novolog Flexpen 12/27 -Insulin pen needles Shelly Coss  MD- Note CBG 422 this AM!  Not sure if this was a true fasting CBG?  Did pt eat prior to CBG being checked?  Please consider:  1. Increase Semglee to 30 units Daily (0.3 units/kg)  2. Increase Novolog Meal Coverage to 8 units TID with meals    --Will follow patient during hospitalization--  [id number] RN, MSN, CDCES Diabetes Coordinator Inpatient  Glycemic Control Team Team Pager: [phone] (8a-5p)

## 2022-03-22 ENCOUNTER — Telehealth: Payer: Self-pay | Admitting: Podiatry

## 2022-03-22 DIAGNOSIS — E1165 Type 2 diabetes mellitus with hyperglycemia: Secondary | ICD-10-CM | POA: Diagnosis not present

## 2022-03-22 LAB — HEMOGLOBIN A1C
Hgb A1c MFr Bld: >15.5 % — ABNORMAL HIGH (ref 4.8–5.6)
Hgb A1c MFr Bld: 14.9 % — ABNORMAL HIGH (ref 4.8–5.6)
Mean Plasma Glucose: >398 mg/dL
Mean Plasma Glucose: 381 mg/dL

## 2022-03-22 LAB — CREATININE, SERUM
Creatinine, Ser: 0.94 mg/dL (ref 0.44–1.00)
GFR, Estimated: >60 mL/min (ref >60)

## 2022-03-22 LAB — VITAMIN B12: Vitamin B-12: 381 pg/mL (ref 180–914)

## 2022-03-22 LAB — GLUCOSE, CAPILLARY: Glucose-Capillary: 396 mg/dL — ABNORMAL HIGH (ref 70–99)

## 2022-03-22 MED ORDER — BLOOD GLUCOSE MONITOR KIT: PACK | 0 refills | Status: DC

## 2022-03-22 MED ORDER — INSULIN ASPART 100 UNIT/ML IJ SOLN: 15.0000 [IU] | Freq: Three times a day (TID) | INTRAMUSCULAR | Status: DC

## 2022-03-22 MED ORDER — CEFADROXIL 500 MG PO CAPS
1000.0000 mg | ORAL_CAPSULE | Freq: Two times a day (BID) | ORAL | Status: DC
  Administered 2022-03-22: 1000 mg via ORAL
  Filled 2022-03-22: qty 2

## 2022-03-22 MED ORDER — CEFADROXIL 500 MG PO CAPS: 1000.0000 mg | ORAL_CAPSULE | Freq: Two times a day (BID) | ORAL | 0 refills | Status: AC

## 2022-03-22 MED ORDER — OXYCODONE HCL 5 MG PO TABS: 5.0000 mg | ORAL_TABLET | ORAL | 0 refills | Status: DC | PRN

## 2022-03-22 MED ORDER — BASAGLAR KWIKPEN 100 UNIT/ML ~~LOC~~ SOPN: 35.0000 [IU] | PEN_INJECTOR | Freq: Every day | SUBCUTANEOUS | 2 refills | Status: DC

## 2022-03-22 MED ORDER — PEN NEEDLES 31G X 8 MM MISC: 1.0000 | Freq: Three times a day (TID) | 1 refills | Status: AC

## 2022-03-22 MED ORDER — NOVOLOG FLEXPEN 100 UNIT/ML ~~LOC~~ SOPN: 20.0000 [IU] | PEN_INJECTOR | Freq: Three times a day (TID) | SUBCUTANEOUS | 2 refills | Status: DC

## 2022-03-22 NOTE — Discharge Instructions (Signed)
Change dressing every day with: surgilube to graft site, adaptic or petroleum gauze, 4x4 gauze and gauze roll around toe

## 2022-03-22 NOTE — Discharge Summary (Signed)
Physician Discharge Summary   Patient: Kaitlin Holloway MRN: [id number] DOB: 09-30-1970  Admit date:     03/18/2022  Discharge date: 03/22/22  Discharge Physician: Ezekiel Slocumb   PCP: Ladell Pier, MD   Recommendations at discharge:   Follow up with Podiatry, Dr. Sherryle Lis in 1 week Follow up with Primary Care within 1-2 weeks Follow up on glycemic control.  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  Status post bone biopsy of right hallux 11/28: Added NovoLog 5 units 3 times daily and adjusted sliding scale per diabetic nurse recommendation 11/29: Increased Semglee and Novolog. Sugars > 400.  Cultures still pending for definitive antibiotic plan.  Assessment and Plan: * Hyperglycemia due to diabetes mellitus (HCC)-resolved as of 03/19/2022 Patient has diabetes melitis and presents with significant hyperglycemia secondary to medication noncompliance Noted to have serum glucose of 634 with no evidence of metabolic acidosis Aggressive IV fluid resuscitation Start patient on Levemir 20 units daily and NovoLog sliding scale coverage Diabetic education  Toe infection See osteomyelitis  Diabetic foot infection ([place]) See osteomyelitis  Hyponatremia Secondary to hyperglycemia Now resolved  Osteomyelitis ([place]) Osteomyelitis distal phalanx right great toe confirmed on MRI S/p bone biopsy ID  input appreciated.   Treated with empiric IV broad spectrum antibiotics - cefepime, Flagyl, vancomycin Bone culture no growth. ID recommends 2 weeks oral Duricef. Pt to continue daily dressing changes, RN did dressing change with patient this AM and she is comfortable managing at home. Podiatry follow up in 1 week.  Uncontrolled type 2 diabetes mellitus with hypoglycemia without coma (HCC) Continue insulin sliding scale.  Continue insulin Semglee once daily.   Hemoglobin A1c is 14.9%.   11/29: CBGs above 400 today 11/30: CBG's improving, remain 200-300's -- Appreciate diabetes coordinator input and patient education --Increase Semglee to 35 units daily --Increase scheduled mealtime NovoLog to 20 units TID WC --Rx for pen needles sent --Close PCP follow up advised and patient agreeable  Polyarthralgia Patient reports diagnosis of rheumatoid arthritis. Continue methotrexate and folic acid  Essential hypertension Continue losartan         Consultants: Podiatry, Infectious disease  Procedures performed:  1) Preparation of wound bed for skin substitute 2.25 cm right hallux 2) Application of skin substitute 2.25 cm right hallux 3) Bone biopsy open superficial  Disposition: Home Diet recommendation:  Carb modified diet DISCHARGE MEDICATION: Allergies as of 03/22/2022       Reactions   Aspirin Other (See Comments)   Muscle spasms in back Other reaction(s): Muscle Pain, Other (See Comments)  Muscle spasms in back  Significant cramping and lower back pain   Penicillins Other (See Comments)   Has patient had a PCN reaction causing immediate rash, facial/tongue/throat swelling, SOB or lightheadedness with hypotension: Yes Has patient had a PCN reaction causing severe rash involving mucus membranes or skin necrosis: No Has patient had a PCN reaction that required hospitalization: No Has patient had a PCN reaction occurring  within the last 10 years: No If all of the above  answers are "NO", then may proceed with Cephalosporin use.   Garlic Swelling   Metformin Other (See Comments)   Other reaction(s): abdominal pain, Other (See Comments)  Severe abdominal pain  GI upset   Morphine And Related Other (See Comments)   Headaches/ migraine   Gadavist [gadobutrol] Nausea And Vomiting   Nausea and vomiting immediately after contrast injection    Latex Rash   Gloves - when worn        Medication List     STOP taking these medications    ciprofloxacin 500 MG tablet Commonly known as: Cipro   clindamycin 300 MG capsule Commonly known as: Cleocin   doxycycline 100 MG tablet Commonly known as: VIBRA-TABS   folic acid 1 MG tablet Commonly known as: FOLVITE   insulin glargine-yfgn 100 UNIT/ML Pen Commonly known as: Semglee (yfgn)   methotrexate 2.5 MG tablet Commonly known as: RHEUMATREX   sulfamethoxazole-trimethoprim 800-160 MG tablet Commonly known as: BACTRIM DS       TAKE these medications    Basaglar KwikPen 100 UNIT/ML Inject 35 Units into the skin daily.   blood glucose meter kit and supplies Kit Dispense based on patient and insurance preference. Use up to four times daily as directed.   cefadroxil 500 MG capsule Commonly known as: DURICEF Take 2 capsules (1,000 mg total) by mouth 2 (two) times daily for 14 days.   losartan 25 MG tablet Commonly known as: COZAAR Take 1 tablet by mouth once daily   NovoLOG FlexPen 100 UNIT/ML FlexPen Generic drug: insulin aspart Inject 20 Units into the skin 3 (three) times daily with meals. What changed: how much to take   oxyCODONE 5 MG immediate release tablet Commonly known as: Oxy IR/ROXICODONE Take 1 tablet (5 mg total) by mouth every 4 (four) hours as needed for breakthrough pain, severe pain or moderate pain.   Pen Needles 31G X 8 MM Misc 1 each by Does not apply route 4 (four) times daily -  before meals and at bedtime.   silver sulfADIAZINE 1 % cream Commonly known as:  Silvadene Apply 1 Application topically daily.               Discharge Care Instructions  (From admission, onward)           Start     Ordered   03/22/22 0000  Leave dressing on - Keep it clean, dry, and intact until clinic visit        03/22/22 0932   03/22/22 0000  Discharge wound care:       Comments: Change dressing with: surgilube to graft site, adaptic or petroleum gauze, 4x4 gauze and gauze roll around toe  Call podiatry office if questions or concerns about wound care.   03/22/22 0932            Follow-up Information     [facility]. Go to.   Why: Follow up for evaluation of chronic recurrent left ear fluid discharge. Patient need to call and schedule follow up appt. Contact information: [address] [facility] [place]        Ladell Pier, MD. Schedule an appointment as soon as possible for a visit in 1 week(s).   Specialty: Internal Medicine Why: Hospital follow up in 1-2 weeks  additional numbers [place] location: [phone] or [phone] Contact information: [address] [place]  [place], Adam R, DPM. Go on 03/29/2022.   Specialty: Podiatry Why: _0 :15pm in [place] with Dr. Rosanne Sack information: [place] [address] [phone]                Discharge Exam: Danley Danker Weights   03/18/22 1159  Weight: 102.1 kg   General exam: awake, alert, no acute distress HEENT: atraumatic, clear conjunctiva, anicteric sclera, moist mucus membranes, hearing grossly normal  Respiratory system: on room air, normal respiratory effort. Cardiovascular system: RRR, no pedal edema.   Gastrointestinal system: soft, NT, ND, no HSM felt, +bowel sounds. Central nervous system: A&O x4. no gross focal neurologic deficits, normal speech Extremities: right foot and 1st toe dressing in place, no edema, normal  tone Skin: dry, intact, normal temperature Psychiatry: normal mood, congruent affect, judgement and insight appear normal   Condition at discharge: stable  The results of significant diagnostics from this hospitalization (including imaging, microbiology, ancillary and laboratory) are listed below for reference.   Imaging Studies: DG Foot Complete Right  Result Date: 03/19/2022 Please see detailed radiograph report in office note.  MR FOOT RIGHT WO CONTRAST  Result Date: 03/18/2022 CLINICAL DATA:  Ulcer involving the plantar medial aspect of the great toe. Pain and swelling. EXAM: MRI OF THE RIGHT FOREFOOT WITHOUT CONTRAST TECHNIQUE: Multiplanar, multisequence MR imaging of the right foot was performed. No intravenous contrast was administered. COMPARISON:  Radiographs, same date. FINDINGS: There is an open wound on the plantar medial aspect of the great toe with surrounding changes of cellulitis I do not see a discrete drainable soft tissue abscess. There is abnormal T2 signal intensity in the distal phalanx of the great toe consistent with osteomyelitis. No findings suspicious for septic arthritis at the interphalangeal joint. The other bony structures are intact. No other sites of osteomyelitis are identified. There is diffuse cellulitis and myofasciitis without findings suspicious for pyomyositis. IMPRESSION: 1. Open wound on the plantar medial aspect of the great toe with surrounding changes of cellulitis and myofasciitis. No discrete drainable soft tissue abscess. 2. Osteomyelitis involving the distal phalanx of the great toe. 3. No findings for septic arthritis at the interphalangeal joint. Electronically Signed   By: Marijo Sanes M.D.   On: 03/18/2022 14:17   DG Toe Great Right  Result Date: 03/18/2022 CLINICAL DATA:  Pain and swelling. Plantar ulcer of the great toe. EXAM: RIGHT GREAT TOE COMPARISON:  Radiographs 03/05/2022 FINDINGS: The joint spaces are maintained. No findings  suspicious for septic arthritis or osteomyelitis. No obvious gas is seen in the soft tissues. IMPRESSION: No plain film findings to suggest septic arthritis or osteomyelitis. Electronically Signed   By: Marijo Sanes M.D.   On: 03/18/2022 12:43    Microbiology: Results for orders placed or performed during the hospital encounter of 03/18/22  Aerobic/Anaerobic Culture w Gram Stain (surgical/deep wound)     Status: None (Preliminary result)   Collection Time: 03/19/22  5:55 PM   Specimen: PATH Bone biopsy; Tissue  Result Value Ref Range Status   Specimen Description   Final    BONE Performed at [facility], [address], [place], [place] [address]    Special Requests   Final    DISTAL HALLUX RT BIG TOE Performed at [facility], [address] [place], [place] [address]    Gram Stain   Final    GRAM POSITIVE COCCI WBC SEEN FEW CRITICAL RESULT CALLED TO, READ  BACK BY AND VERIFIED WITH: DONNA HESOEP AT 1912 ON 03/19/22 BY SS Performed at [facility], [address], [place], [place] [address]    Culture   Final    HOLDING FOR POSSIBLE ANAEROBE Performed at [facility], [place] [address], [place], [place] [address]    Report Status PENDING  Incomplete    Labs: CBC: Recent Labs  Lab 03/18/22 1215 03/19/22 0507 03/20/22 0343  WBC 7.0 5.8 7.4  NEUTROABS 4.6  --   --   HGB 14.9 13.2 13.4  HCT 41.4 37.7 38.8  MCV 81.3 81.6 82.9  PLT 187 167 443   Basic Metabolic Panel: Recent Labs  Lab 03/18/22 1215 03/19/22 0507 03/20/22 0053 03/20/22 0343 03/22/22 0610  NA 130* 133*  --  135  --   K 4.2 4.7  --  4.7  --   CL 100 103  --  105  --   CO2 24 22  --  25  --   GLUCOSE 634* 549* 515* 437*  --   BUN 33* 31*  --  26*  --   CREATININE 1.00 1.06*  --  1.02* 0.94  CALCIUM 8.8* 8.5*  --  8.2*  --    Liver Function Tests: Recent Labs  Lab 03/18/22 1215  AST 20  ALT 24  ALKPHOS 228*  BILITOT 0.7  PROT 6.8  ALBUMIN 3.2*    CBG: Recent Labs  Lab 03/21/22 0812 03/21/22 1150 03/21/22 1618 03/21/22 2040 03/22/22 0736  GLUCAP 422* 405* 227* 207* 396*    Discharge time spent: greater than 30 minutes.  Signed: Ezekiel Slocumb, DO [facility] 03/22/2022

## 2022-03-22 NOTE — Telephone Encounter (Signed)
Received call from [facility] and pt is being discharged and they needed an appt in a week. Pt was already scheduled with Dr Ardelle Anton 12.7 for wound care follow up. I was not sure if ok for pt to see Dr Ardelle Anton or if pt needed to see Dr Lilian Kapur as he did a procedure in the hospital on 11.27

## 2022-03-22 NOTE — Progress Notes (Signed)
Pt A/Ox4 upon review of AVS. Wound care completed prior to discharge and sent home with supplies. Pharmacy correct and patient will proceed with insulin lunch dose at home.

## 2022-03-22 NOTE — TOC Transition Note (Signed)
Transition of Care [facility]) - CM/SW Discharge Note   Patient Details  Name: Kingston Guiles MRN: [id number] Date of Birth: 1970-07-27  Transition of Care [facility]) CM/SW Contact:  Tempie Hoist, LCSWA Phone Number: 03/22/2022, 11:20 AM   Clinical Narrative:     The patient has DME and supplies at home. Patient will go home without HHRN. Most HH do not accept her insurance.   Final next level of care: Home/Self Care     Patient Goals and CMS Choice        Discharge Placement               Home     Patient and family notified of of transfer: 03/22/22  Discharge Plan and Services                     Home                Social Determinants of Health (SDOH) Interventions     Readmission Risk Interventions     No data to display

## 2022-03-22 NOTE — Telephone Encounter (Signed)
Pt scheduled changed to Dr Lilian Kapur at 1pm per him.

## 2022-03-22 NOTE — TOC Progression Note (Signed)
Transition of Care [facility]) - Progression Note    Patient Details  Name: Kaitlin Holloway MRN: [id number] Date of Birth: 06/01/1970  Transition of [facility]) CM/SW Contact  Tempie Hoist, [place] Phone Number: 03/22/2022, 10:21 AM  Clinical Narrative:     TOC reached out to [facility] regarding [facility] staffing in [place]. Patient does not need IV antibiotics at home.       Expected Discharge Plan and Services         Living arrangements for the past 2 months: Single Family Home Expected Discharge Date: 03/22/22                                     Social Determinants of Health (SDOH) Interventions    Readmission Risk Interventions     No data to display

## 2022-03-23 ENCOUNTER — Other Ambulatory Visit: Payer: Self-pay | Admitting: Internal Medicine

## 2022-03-23 DIAGNOSIS — E1159 Type 2 diabetes mellitus with other circulatory complications: Secondary | ICD-10-CM

## 2022-03-23 LAB — AEROBIC/ANAEROBIC CULTURE W GRAM STAIN (SURGICAL/DEEP WOUND): Culture: NEGATIVE

## 2022-03-23 NOTE — Telephone Encounter (Signed)
Last RF 01/18/22 #90  Requested Prescriptions  Refused Prescriptions Disp Refills   losartan (COZAAR) 25 MG tablet 90 tablet 0    Sig: Take 1 tablet (25 mg total) by mouth daily.     Cardiovascular:  Angiotensin Receptor Blockers Failed - 03/23/2022 10:32 AM      Failed - Last BP in normal range    BP Readings from Last 1 Encounters:  03/22/22 (!) 149/97         Failed - Valid encounter within last 6 months    Recent Outpatient Visits           7 months ago Establishing care with new doctor, encounter for   [facility] Marcine Matar, MD       Future Appointments             In 1 month Marcine Matar, MD [facility]            Passed - Cr in normal range and within 180 days    Creatinine, Ser  Date Value Ref Range Status  03/22/2022 0.94 0.44 - 1.00 mg/dL Final         Passed - K in normal range and within 180 days    Potassium  Date Value Ref Range Status  03/20/2022 4.7 3.5 - 5.1 mmol/L Final         Passed - Patient is not pregnant

## 2022-03-23 NOTE — Telephone Encounter (Signed)
Medication Refill - Medication: losartan (COZAAR) 25 MG tablet [[id number]]   Has the patient contacted their pharmacy? Yes.   (Agent: If no, request that the patient contact the pharmacy for the refill. If patient does not wish to contact the pharmacy document the reason why and proceed with request.) (Agent: If yes, when and what did the pharmacy advise?)  Preferred Pharmacy (with phone number or street name): [facility] - [place] - [address], [place] [address] Phone: [phone]  Fax: [phone]   Has the patient been seen for an appointment in the last year OR does the patient have an upcoming appointment? Yes.    Agent: Please be advised that RX refills may take up to 3 business days. We ask that you follow-up with your pharmacy.Marland Kitchen

## 2022-03-26 ENCOUNTER — Telehealth: Payer: Self-pay

## 2022-03-26 ENCOUNTER — Encounter (HOSPITAL_COMMUNITY): Payer: Commercial Managed Care - HMO

## 2022-03-26 ENCOUNTER — Telehealth: Payer: Self-pay | Admitting: Internal Medicine

## 2022-03-26 MED ORDER — METRONIDAZOLE 500 MG PO TABS: 500.0000 mg | ORAL_TABLET | Freq: Two times a day (BID) | ORAL | 0 refills | Status: DC

## 2022-03-26 NOTE — Telephone Encounter (Signed)
-----   Message from Lynn Ito, MD sent at 03/24/2022  6:28 PM EST ----- Regarding: RE: Please add flagyl 500 mg Po bID ----- Message ----- From: Marcine Matar, MD Sent: 03/24/2022   3:45 PM EST To: Lynn Ito, MD  This is a patient who was hospitalized last week with osteomyelitis of her toe.  Bone culture grew a few Finegoldia Magna.  Just wanted to make sure [employer] will provide adequate coverage.

## 2022-03-26 NOTE — Telephone Encounter (Signed)
Transition Care Management Unsuccessful Follow-up Telephone Call  Date of discharge and from where:  03/22/2022, [facility]  Attempts:  1st Attempt  Reason for unsuccessful TCM follow-up call:  Left voice message on [phone] , call back requested.

## 2022-03-27 ENCOUNTER — Telehealth: Payer: Self-pay

## 2022-03-27 DIAGNOSIS — E1142 Type 2 diabetes mellitus with diabetic polyneuropathy: Secondary | ICD-10-CM

## 2022-03-27 NOTE — Telephone Encounter (Signed)
Transition Care Management Follow-up Telephone Call Date of discharge and from where: 03/22/2022, [facility] How have you been since you were released from the hospital? She said she is doing well, feeling okay.  Any questions or concerns? Yes- - She said that her biggest worry is that she has missed work due to her illness and is concerned about being able to pay her bills. She explained that she has been blessed to have an amazing boyfriend who is helping her with her bills. She was homeless in the past and this inability to work is triggering her mind to remember the time she was homeless.   Items Reviewed: Did the pt receive and understand the discharge instructions provided? Yes  Medications obtained and verified? Yes -she said she has most of her medications. She does not have any novolog because her pharmacy is waiting on a prior auth.  She will need a refill of losartan in 2 weeks. I let her know that Dr Laural Benes received the results of the bone culture from her toe.  Based on the bacteria that grew in the culture, we need to add another antibiotic called Flagyl.  the patient said that she already picked it up.  She has a prescription for a new glucometer but has not picked one up yet.  Other? No  Any new allergies since your discharge? No  Dietary orders reviewed? No Do you have support at home? Yes   Home Care and Equipment/Supplies: Were home health services ordered? no If so, what is the name of the agency? N/a  Has the agency set up a time to come to the patient's home? not applicable Were any new equipment or medical supplies ordered?  No What is the name of the medical supply agency? N/a Were you able to get the supplies/equipment? not applicable Do you have any questions related to the use of the equipment or supplies? No  Functional Questionnaire: (I = Independent and D = Dependent) ADLs: independent   Follow up appointments reviewed:  PCP Hospital  f/u appt confirmed?  Her appointment with Dr Laural Benes is 05/07/2022.  I offered to schedule her with another provider an/or at another clinic  to be seen sooner and she declined  She only wants to see Dr [facility] f/u appt confirmed? Yes  Scheduled to see podiatry - 03/29/2022.  Are transportation arrangements needed? No  If their condition worsens, is the pt aware to call PCP or go to the Emergency Dept.? Yes Was the patient provided with contact information for the PCP's office or ED? Yes Was to pt encouraged to call back with questions or concerns? Yes

## 2022-03-28 ENCOUNTER — Other Ambulatory Visit: Payer: Self-pay | Admitting: Internal Medicine

## 2022-03-28 DIAGNOSIS — E1159 Type 2 diabetes mellitus with other circulatory complications: Secondary | ICD-10-CM

## 2022-03-28 MED ORDER — NOVOLOG FLEXPEN 100 UNIT/ML ~~LOC~~ SOPN: 20.0000 [IU] | PEN_INJECTOR | Freq: Three times a day (TID) | SUBCUTANEOUS | 2 refills | Status: DC

## 2022-03-28 MED ORDER — LOSARTAN POTASSIUM 25 MG PO TABS: 25.0000 mg | ORAL_TABLET | Freq: Every day | ORAL | 0 refills | Status: DC

## 2022-03-28 NOTE — Telephone Encounter (Signed)
I called the patient and informed her that Dr Laural Benes sent refills for novolog and losartan to her pharmacy. She said that the pharmacy is waiting for the prior auth for the novolog.

## 2022-03-29 ENCOUNTER — Other Ambulatory Visit: Payer: Self-pay

## 2022-03-29 ENCOUNTER — Encounter: Payer: Self-pay | Admitting: Podiatry

## 2022-03-29 ENCOUNTER — Ambulatory Visit (INDEPENDENT_AMBULATORY_CARE_PROVIDER_SITE_OTHER): Payer: Commercial Managed Care - HMO | Admitting: Podiatry

## 2022-03-29 VITALS — BP 154/84 | HR 111 | Temp 97.7°F

## 2022-03-29 DIAGNOSIS — L03031 Cellulitis of right toe: Secondary | ICD-10-CM

## 2022-03-29 DIAGNOSIS — L02611 Cutaneous abscess of right foot: Secondary | ICD-10-CM | POA: Diagnosis not present

## 2022-03-29 DIAGNOSIS — M86171 Other acute osteomyelitis, right ankle and foot: Secondary | ICD-10-CM | POA: Diagnosis not present

## 2022-03-29 MED ORDER — INSULIN LISPRO (1 UNIT DIAL) 100 UNIT/ML (KWIKPEN): 20.0000 [IU] | PEN_INJECTOR | Freq: Three times a day (TID) | SUBCUTANEOUS | 11 refills | Status: DC

## 2022-03-29 NOTE — Telephone Encounter (Signed)
Phone call placed to patient this evening to let her know that Humalog is the preferred short acting insulin for her insurance.  I got voicemail but her voicemail box was full and I was unable to leave a message.

## 2022-03-31 NOTE — Telephone Encounter (Signed)
03/31/2022: I reached out to patient again this morning and was able to reach her.  Patient informed that the Humalog insulin is preferred on her insurance not the NovoLog.  Advised that I have sent prescription for the Humalog to her pharmacy.  Patient expressed understanding and thanked me for calling.

## 2022-04-01 NOTE — Progress Notes (Signed)
  Subjective:  Patient ID: Kaitlin Holloway, female    DOB: 05-Dec-1970,  MRN: [id number]  Chief Complaint  Patient presents with   Diabetes    hospital post op right great toe bone biospy/wound(dos 11.27.2023) Dr Lilian Kapur( added at 100pm per Dr Lilian Kapur Please scan ins card     51 y.o. female returns for post-op check. She is feeling well overall  Review of Systems: Negative except as noted in the HPI. Denies N/V/F/Ch.   Objective:   Vitals:   03/29/22 1313  BP: (!) 154/84  Pulse: (!) 111  Temp: 97.7 F (36.5 C)   There is no height or weight on file to calculate BMI. Constitutional Well developed. Well nourished.  Vascular Foot warm and well perfused. Capillary refill normal to all digits.  Calf is soft and supple, no posterior calf or knee pain, negative Homans' sign  Neurologic Normal speech. Oriented to person, place, and time. Epicritic sensation to light touch grossly reduced with dense digital neuropathy bilaterally.  Dermatologic Skin healing well without signs of infection. Skin edges well coapted without signs of infection. Graft intact. Some suture pull through  Orthopedic: She has no tenderness to palpation noted about the surgical site.    Assessment:   1. Cellulitis and abscess of toe of right foot   2. Other acute osteomyelitis of right foot (HCC)    Plan:  Patient was evaluated and treated and all questions answered.  S/p foot surgery right -Progressing as expected post-operatively. -WB Status: WBAT in post op shoe -Sutures: remove in 2 weeks. -Pathology with benign bone, bone culture did grow Finegoldia. Will refer to ID for further abx recommendations -Foot redressed. She should continue daily home dressing changes at home w/ surgilube  Return in about 2 weeks (around 04/12/2022) for wound care.

## 2022-04-17 ENCOUNTER — Ambulatory Visit (INDEPENDENT_AMBULATORY_CARE_PROVIDER_SITE_OTHER): Payer: Commercial Managed Care - HMO | Admitting: Podiatry

## 2022-04-17 DIAGNOSIS — Z91199 Patient's noncompliance with other medical treatment and regimen due to unspecified reason: Secondary | ICD-10-CM

## 2022-04-21 NOTE — Progress Notes (Signed)
Patient was no-show for appointment today 

## 2022-04-24 ENCOUNTER — Ambulatory Visit: Payer: Commercial Managed Care - HMO | Admitting: Internal Medicine

## 2022-04-26 ENCOUNTER — Ambulatory Visit: Payer: Commercial Managed Care - HMO | Admitting: *Deleted

## 2022-05-02 ENCOUNTER — Encounter: Payer: Commercial Managed Care - HMO | Admitting: Podiatry

## 2022-05-03 ENCOUNTER — Ambulatory Visit (INDEPENDENT_AMBULATORY_CARE_PROVIDER_SITE_OTHER): Payer: BC Managed Care – PPO | Admitting: Podiatry

## 2022-05-03 DIAGNOSIS — L02611 Cutaneous abscess of right foot: Secondary | ICD-10-CM

## 2022-05-03 DIAGNOSIS — L03031 Cellulitis of right toe: Secondary | ICD-10-CM | POA: Diagnosis not present

## 2022-05-03 DIAGNOSIS — M86171 Other acute osteomyelitis, right ankle and foot: Secondary | ICD-10-CM

## 2022-05-03 DIAGNOSIS — L97512 Non-pressure chronic ulcer of other part of right foot with fat layer exposed: Secondary | ICD-10-CM

## 2022-05-03 MED ORDER — METRONIDAZOLE 500 MG PO TABS: 500.0000 mg | ORAL_TABLET | Freq: Two times a day (BID) | ORAL | 0 refills | Status: DC

## 2022-05-03 MED ORDER — DOXYCYCLINE HYCLATE 100 MG PO TABS: 100.0000 mg | ORAL_TABLET | Freq: Two times a day (BID) | ORAL | 0 refills | Status: DC

## 2022-05-07 ENCOUNTER — Ambulatory Visit: Payer: Medicaid Other | Admitting: Internal Medicine

## 2022-05-07 LAB — WOUND CULTURE
MICRO NUMBER:: 14419653
SPECIMEN QUALITY:: ADEQUATE

## 2022-05-07 NOTE — Progress Notes (Signed)
  Subjective:  Patient ID: Kaitlin Holloway, female    DOB: [birth year],  MRN: [id number]  Chief Complaint  Patient presents with   Routine Post Op    POV #2 DOS 11/27/2023RIGHT GREAT TOE BONE BIOPSY WITH WOUND DEBRIDEMENT AND GRAFT APPLICATION2 wk wound care, ok per Estill Bamberg     52 y.o. female returns for post-op check.   Review of Systems: Negative except as noted in the HPI. Denies N/V/F/Ch.   Objective:   There were no vitals filed for this visit.  There is no height or weight on file to calculate BMI. Constitutional Well developed. Well nourished.  Vascular Foot warm and well perfused. Capillary refill normal to all digits.  Calf is soft and supple, no posterior calf or knee pain, negative Homans' sign  Neurologic Normal speech. Oriented to person, place, and time. Epicritic sensation to light touch grossly reduced with dense digital neuropathy bilaterally.  Dermatologic Wound is macerated and has overlying hyperkeratosis increase in size measures 2.0 x 1.5 cm, erythema here around the toe  Orthopedic: She has no tenderness to palpation noted about the surgical site.      Assessment:   1. Cellulitis and abscess of toe of right foot    Plan:  Patient was evaluated and treated and all questions answered.  S/p foot surgery right -Unfortunately has worsened, suspect this is possible persistent infection.  I recommended an x-ray today, she wanted to wait on this because her insurance has lapsed and not reactivated until the first of next month so at her request we have held off on this.  We did take a wound culture today of the drainage which showed to be a purulent type material.  I put her on metronidazole and cleaned partially based on her previous cultures.  I will see her back in 4 weeks for follow-up, I advised her on signs symptoms of worsening infection and she will return to see me sooner if any of these develop regardless of her insurance status  Return in about 4 weeks  (around 05/31/2022) for wound care.

## 2022-05-08 ENCOUNTER — Ambulatory Visit: Payer: Medicaid Other | Admitting: Internal Medicine

## 2022-05-25 ENCOUNTER — Ambulatory Visit: Payer: Medicaid Other | Admitting: Internal Medicine

## 2022-05-28 ENCOUNTER — Ambulatory Visit: Payer: Self-pay | Admitting: *Deleted

## 2022-05-30 ENCOUNTER — Ambulatory Visit (INDEPENDENT_AMBULATORY_CARE_PROVIDER_SITE_OTHER): Payer: Self-pay | Admitting: Podiatry

## 2022-05-30 DIAGNOSIS — L97512 Non-pressure chronic ulcer of other part of right foot with fat layer exposed: Secondary | ICD-10-CM

## 2022-06-03 ENCOUNTER — Encounter: Payer: Self-pay | Admitting: Podiatry

## 2022-06-03 NOTE — Progress Notes (Signed)
  Subjective:  Patient ID: Kaitlin Holloway, female    DOB: [birth year],  MRN: [id number]  Chief Complaint  Patient presents with   Post-op Problem    POV #3 DOS 11/27/2023RIGHT GREAT TOE BONE BIOPSY WITH WOUND DEBRIDEMENT AND GRAFT APPLICATION     52 y.o. female returns for post-op check.  She says it has been doing better, she has been on her foot less and her blood sugar remains well-controlled  Review of Systems: Negative except as noted in the HPI. Denies N/V/F/Ch.   Objective:   There were no vitals filed for this visit.  There is no height or weight on file to calculate BMI. Constitutional Well developed. Well nourished.  Vascular Foot warm and well perfused. Capillary refill normal to all digits.  Calf is soft and supple, no posterior calf or knee pain, negative Homans' sign  Neurologic Normal speech. Oriented to person, place, and time. Epicritic sensation to light touch grossly reduced with dense digital neuropathy bilaterally.  Dermatologic Wound is improved significantly and has overlying hyperkeratosis decreased in size measures 0.6 x 0.3 x 0.4 cm, no erythema purulence drainage or malodor  Orthopedic: She has no tenderness to palpation noted about the surgical site.      Assessment:   1. Toe ulcer, right, with fat layer exposed ([place])    Plan:  Patient was evaluated and treated and all questions answered.  S/p foot surgery right -Doing very well.  Her wound is improved significantly in size due to changes in her work requirements as well as good glycemic control.  She should continue changing the dressing at home.  I will see her back in 4 weeks for follow-up, her insurance has changed to [facility] and if she is unable to continue seeing is here then I will refer her to the podiatry group at [facility].  Hopefully continues to improve  Return in about 4 weeks (around 06/27/2022) for wound care.

## 2022-06-03 NOTE — Progress Notes (Unsigned)
Not seen

## 2022-06-18 ENCOUNTER — Telehealth: Payer: Self-pay | Admitting: Podiatry

## 2022-06-18 NOTE — Telephone Encounter (Signed)
Pt called and is in need of a referral to a podiatrist that is in network with her insurance. It is thru [facility])

## 2022-06-22 ENCOUNTER — Other Ambulatory Visit: Payer: Self-pay

## 2022-06-22 DIAGNOSIS — L97512 Non-pressure chronic ulcer of other part of right foot with fat layer exposed: Secondary | ICD-10-CM

## 2022-06-22 NOTE — Telephone Encounter (Signed)
Referral was sent over to [facility] for Dr. Newton Pigg

## 2022-06-27 ENCOUNTER — Ambulatory Visit: Payer: BLUE CROSS/BLUE SHIELD | Admitting: Podiatry

## 2022-07-17 ENCOUNTER — Ambulatory Visit: Payer: Medicaid Other | Admitting: Podiatry

## 2022-07-27 ENCOUNTER — Other Ambulatory Visit: Payer: Self-pay | Admitting: Internal Medicine

## 2022-07-27 DIAGNOSIS — I152 Hypertension secondary to endocrine disorders: Secondary | ICD-10-CM

## 2022-07-27 NOTE — Telephone Encounter (Signed)
Requested medications are due for refill today.  yes  Requested medications are on the active medications list.  yes  Last refill. 03/28/2022 #90 0 rf  Future visit scheduled.   yes  Notes to clinic.  Pt is more than 3 months overdue for OV.    Requested Prescriptions  Pending Prescriptions Disp Refills   losartan (COZAAR) 25 MG tablet [Pharmacy Med Name: Losartan Potassium 25 MG Oral Tablet] 90 tablet 0    Sig: Take 1 tablet by mouth once daily     Cardiovascular:  Angiotensin Receptor Blockers Failed - 07/27/2022 11:16 AM      Failed - Last BP in normal range    BP Readings from Last 1 Encounters:  03/29/22 (!) 154/84         Failed - Valid encounter within last 6 months    Recent Outpatient Visits           11 months ago Establishing care with new doctor, encounter for   [facility] Marcine Matar, MD       Future Appointments             In 2 months Marcine Matar, MD [facility]            Passed - Cr in normal range and within 180 days    Creatinine, Ser  Date Value Ref Range Status  03/22/2022 0.94 0.44 - 1.00 mg/dL Final         Passed - K in normal range and within 180 days    Potassium  Date Value Ref Range Status  03/20/2022 4.7 3.5 - 5.1 mmol/L Final         Passed - Patient is not pregnant

## 2022-08-15 DIAGNOSIS — Z6830 Body mass index (BMI) 30.0-30.9, adult: Secondary | ICD-10-CM | POA: Diagnosis not present

## 2022-08-15 DIAGNOSIS — I10 Essential (primary) hypertension: Secondary | ICD-10-CM | POA: Diagnosis not present

## 2022-08-15 DIAGNOSIS — E119 Type 2 diabetes mellitus without complications: Secondary | ICD-10-CM | POA: Diagnosis not present

## 2022-08-15 DIAGNOSIS — Z87892 Personal history of anaphylaxis: Secondary | ICD-10-CM | POA: Diagnosis not present

## 2022-08-15 DIAGNOSIS — Z794 Long term (current) use of insulin: Secondary | ICD-10-CM | POA: Diagnosis not present

## 2022-08-15 DIAGNOSIS — Z888 Allergy status to other drugs, medicaments and biological substances status: Secondary | ICD-10-CM | POA: Diagnosis not present

## 2022-08-15 DIAGNOSIS — Z885 Allergy status to narcotic agent status: Secondary | ICD-10-CM | POA: Diagnosis not present

## 2022-08-15 DIAGNOSIS — M3390 Dermatopolymyositis, unspecified, organ involvement unspecified: Secondary | ICD-10-CM | POA: Diagnosis not present

## 2022-08-15 DIAGNOSIS — Z809 Family history of malignant neoplasm, unspecified: Secondary | ICD-10-CM | POA: Diagnosis not present

## 2022-08-15 DIAGNOSIS — Z880 Allergy status to penicillin: Secondary | ICD-10-CM | POA: Diagnosis not present

## 2022-08-15 DIAGNOSIS — Z833 Family history of diabetes mellitus: Secondary | ICD-10-CM | POA: Diagnosis not present

## 2022-08-15 DIAGNOSIS — Z8249 Family history of ischemic heart disease and other diseases of the circulatory system: Secondary | ICD-10-CM | POA: Diagnosis not present

## 2022-10-23 ENCOUNTER — Encounter: Payer: Self-pay | Admitting: Internal Medicine

## 2022-10-23 ENCOUNTER — Ambulatory Visit: Payer: 59 | Attending: Internal Medicine | Admitting: Internal Medicine

## 2022-10-23 VITALS — BP 153/96 | HR 81 | Temp 98.2°F | Ht 74.0 in | Wt 238.0 lb

## 2022-10-23 DIAGNOSIS — Z833 Family history of diabetes mellitus: Secondary | ICD-10-CM | POA: Insufficient documentation

## 2022-10-23 DIAGNOSIS — Z8249 Family history of ischemic heart disease and other diseases of the circulatory system: Secondary | ICD-10-CM | POA: Diagnosis not present

## 2022-10-23 DIAGNOSIS — M069 Rheumatoid arthritis, unspecified: Secondary | ICD-10-CM | POA: Insufficient documentation

## 2022-10-23 DIAGNOSIS — I152 Hypertension secondary to endocrine disorders: Secondary | ICD-10-CM

## 2022-10-23 DIAGNOSIS — E1165 Type 2 diabetes mellitus with hyperglycemia: Secondary | ICD-10-CM

## 2022-10-23 DIAGNOSIS — Z87891 Personal history of nicotine dependence: Secondary | ICD-10-CM | POA: Diagnosis not present

## 2022-10-23 DIAGNOSIS — Z1211 Encounter for screening for malignant neoplasm of colon: Secondary | ICD-10-CM

## 2022-10-23 DIAGNOSIS — E119 Type 2 diabetes mellitus without complications: Secondary | ICD-10-CM | POA: Diagnosis not present

## 2022-10-23 DIAGNOSIS — R1013 Epigastric pain: Secondary | ICD-10-CM | POA: Diagnosis not present

## 2022-10-23 DIAGNOSIS — E1159 Type 2 diabetes mellitus with other circulatory complications: Secondary | ICD-10-CM | POA: Diagnosis not present

## 2022-10-23 DIAGNOSIS — Z5320 Procedure and treatment not carried out because of patient's decision for unspecified reasons: Secondary | ICD-10-CM | POA: Diagnosis not present

## 2022-10-23 DIAGNOSIS — I10 Essential (primary) hypertension: Secondary | ICD-10-CM | POA: Insufficient documentation

## 2022-10-23 DIAGNOSIS — Z794 Long term (current) use of insulin: Secondary | ICD-10-CM | POA: Diagnosis not present

## 2022-10-23 DIAGNOSIS — H9213 Otorrhea, bilateral: Secondary | ICD-10-CM | POA: Diagnosis not present

## 2022-10-23 DIAGNOSIS — J302 Other seasonal allergic rhinitis: Secondary | ICD-10-CM | POA: Insufficient documentation

## 2022-10-23 LAB — GLUCOSE, POCT (MANUAL RESULT ENTRY)
POC Glucose: 579 mg/dl — AB (ref 70–99)
POC Glucose: 491 mg/dl — AB (ref 70–99)

## 2022-10-23 LAB — POCT URINALYSIS DIP (CLINITEK)
Bilirubin, UA: NEGATIVE
Glucose, UA: >=1000 mg/dL — AB
Ketones, POC UA: NEGATIVE mg/dL
Leukocytes, UA: NEGATIVE
Nitrite, UA: NEGATIVE
POC PROTEIN,UA: >=300 — AB
Spec Grav, UA: 1.015 (ref 1.010–1.025)
Urobilinogen, UA: 0.2 E.U./dL
pH, UA: 5.5 (ref 5.0–8.0)

## 2022-10-23 LAB — POCT GLYCOSYLATED HEMOGLOBIN (HGB A1C)

## 2022-10-23 MED ORDER — BASAGLAR KWIKPEN 100 UNIT/ML ~~LOC~~ SOPN: 25.0000 [IU] | PEN_INJECTOR | Freq: Every day | SUBCUTANEOUS | 2 refills | Status: DC

## 2022-10-23 MED ORDER — INSULIN LISPRO (1 UNIT DIAL) 100 UNIT/ML (KWIKPEN)
10.0000 [IU] | PEN_INJECTOR | Freq: Three times a day (TID) | SUBCUTANEOUS | 11 refills | Status: DC
Start: 2022-10-23 — End: 2023-04-09

## 2022-10-23 MED ORDER — FREESTYLE LIBRE 3 SENSOR MISC
6 refills | Status: DC
Start: 2022-10-23 — End: 2022-11-27

## 2022-10-23 MED ORDER — LORATADINE 10 MG PO TABS
10.0000 mg | ORAL_TABLET | Freq: Every day | ORAL | 1 refills | Status: DC
Start: 2022-10-23 — End: 2024-03-09

## 2022-10-23 MED ORDER — LOSARTAN POTASSIUM 50 MG PO TABS: 50.0000 mg | ORAL_TABLET | Freq: Every day | ORAL | 6 refills | Status: DC

## 2022-10-23 MED ORDER — INSULIN ASPART 100 UNIT/ML IJ SOLN
15.0000 [IU] | Freq: Once | INTRAMUSCULAR | Status: AC
Start: 2022-10-23 — End: 2022-10-23
  Administered 2022-10-23: 15 [IU] via SUBCUTANEOUS

## 2022-10-23 MED ORDER — FLUTICASONE PROPIONATE 50 MCG/ACT NA SUSP: 1.0000 | Freq: Every day | NASAL | 1 refills | Status: DC

## 2022-10-23 MED ORDER — FREESTYLE LIBRE 3 READER DEVI
1.0000 | Freq: Every day | 0 refills | Status: DC
Start: 2022-10-23 — End: 2022-11-27

## 2022-10-23 MED ORDER — OMEPRAZOLE 40 MG PO CPDR
40.0000 mg | DELAYED_RELEASE_CAPSULE | Freq: Every day | ORAL | 3 refills | Status: AC
Start: 2022-10-23 — End: ?

## 2022-10-23 NOTE — Progress Notes (Unsigned)
Patient ID: Kaitlin Holloway, female    DOB: [birth year]  MRN: [id number]  CC: Diabetes (DM f/u. Med refills. [facility] referral. Kem Parkinson / swelling on upper abdomen - painful / cramps X4 mo/No to shingles vax)   Subjective: Kaitlin Holloway is a 52 y.o. female who presents for chronic Her concerns today include:  Patient with history of DM type II, HTN, thyromegaly, former smoker, arthritis, inflammatory myositis, rheumatoid arthritis (followed by Dr. Aryl)   Patient last seen by me 07/2021.  DM: Results for orders placed or performed in visit on 10/23/22  POCT glucose (manual entry)  Result Value Ref Range   POC Glucose 579 (A) 70 - 99 mg/dl  W0J >81 On last visit, we had started her on Semglee 14 units and NovoLog 5 units with meals.  She was subsequently hospitalized in November 2023 for osteomyelitis of the right big toe.  Semglee was increased to 35 units daily and NovoLog to 20 units with meals. -she stopped meds x 2 mths; stopped taking because "I am in denial about it.  I'm tired of being sick" Reports BS high since being on placed on Prednisone 03/2020 -has been drinking juices and ginger ale for past 3 days  HTN:  on Cozaar 25 mg daily. Thinks BP high because she has been drinking sodas  Request referral to ENT.  Ear leaks clear/yellow fluid and has crusting and itching.  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 Use up to four times daily as directed. (Patient not taking: Reported on 10/23/2022) 1 each 0   doxycycline (VIBRA-TABS) 100 MG tablet Take 1 tablet (100 mg total) by mouth 2 (two) times daily. (Patient not taking: Reported on 10/23/2022) 28 tablet 0   Insulin Glargine (BASAGLAR KWIKPEN) 100 UNIT/ML Inject 35 Units into the skin daily. (Patient not taking: Reported on 10/23/2022) 15 mL 2   insulin lispro (HUMALOG KWIKPEN) 100 UNIT/ML KwikPen Inject 20 Units into the skin 3 (three) times daily. Take three times a day with meals. (Patient not taking: Reported on 10/23/2022) 15 mL 11   Insulin Pen Needle (PEN NEEDLES) 31G X 8 MM MISC 1 each by Does not apply route 4 (four) times daily -  before meals and at bedtime. (Patient not taking: Reported on 10/23/2022) 100 each 1   metroNIDAZOLE (FLAGYL) 500 MG tablet Take 1 tablet (500 mg total) by mouth 2 (two) times daily. (Patient not taking: Reported on 10/23/2022) 20 tablet 0   oxyCODONE (OXY IR/ROXICODONE) 5 MG immediate release tablet Take 1 tablet (5 mg total) by mouth every 4 (four) hours as needed for breakthrough pain, severe pain or moderate pain. (Patient not taking: Reported on 10/23/2022) 30 tablet 0   silver  sulfADIAZINE (SILVADENE) 1 % cream Apply 1 Application topically daily. (Patient not taking: Reported on 10/23/2022) 50 g 0   [DISCONTINUED] lisinopril-hydrochlorothiazide (ZESTORETIC) 20-12.5 MG tablet Take 1 tablet by mouth daily. (Patient not taking: Reported on 07/11/2018) 30 tablet 3   No current facility-administered medications on file prior to visit.    Allergies  Allergen Reactions   Aspirin Other (See Comments)    Muscle spasms in back  Other reaction(s): Muscle Pain, Other (See Comments)  Muscle spasms in back  Significant cramping  and lower back pain   Penicillins Other (See Comments)    Has patient had a PCN reaction causing immediate rash, facial/tongue/throat swelling, SOB or lightheadedness with hypotension: Yes Has patient had a PCN reaction causing severe rash involving mucus membranes or skin necrosis: No Has patient had a PCN reaction that required hospitalization: No Has patient had a PCN reaction occurring within the last 10 years: No If all of the above answers are "NO", then may proceed with Cephalosporin use.    Garlic Swelling   Metformin Other (See Comments)    Other reaction(s): abdominal pain, Other (See Comments)  Severe abdominal pain  GI upset   Morphine And Codeine Other (See Comments)    Headaches/ migraine   Gadavist [Gadobutrol] Nausea And Vomiting    Nausea and vomiting immediately after contrast injection    Latex Rash    Gloves - when worn    Social History   Socioeconomic History   Marital status: Single    Spouse name: Not on file   Number of children: 1   Years of education: Not on file   Highest education level: Master's degree (e.g., MA, MS, MEng, MEd, MSW, MBA)  Occupational History   Occupation: Database administrator.  Tobacco Use   Smoking status: Former    Packs/day: 1.50    Years: 17.00    Additional pack years: 0.00    Total pack years: 25.50    Types: Cigarettes    Quit date: 07/10/2000    Years since quitting: 22.3   Smokeless tobacco: Never  Vaping Use   Vaping Use: Never used  Substance and Sexual Activity   Alcohol use: Yes    Alcohol/week: 2.0 standard drinks of alcohol    Types: 2 Cans of beer per week   Drug use: No    Comment: MJ when young   Sexual activity: Not on file  Other Topics Concern   Not on file  Social History Narrative   Lives alone, no support.Marland Kitchen   Has been hard during COVID19 pandemic.       Right handed   Caffeine: 1 cup coffee maybe twice a week   Social Determinants of Health   Financial Resource Strain: Not  on file  Food Insecurity: No Food Insecurity (03/18/2022)   Hunger Vital Sign    Worried About Running Out of Food in the Last Year: Never true    Ran Out of Food in the Last Year: Never true  Transportation Needs: No Transportation Needs (03/18/2022)   PRAPARE - Administrator, Civil Service (Medical): No    Lack of Transportation (Non-Medical): No  Physical Activity: Not on file  Stress: Not on file  Social Connections: Not on file  Intimate Partner Violence: Not At Risk (03/18/2022)   Humiliation, Afraid, Rape, and Kick questionnaire    Fear of Current or Ex-Partner: No    Emotionally Abused: No    Physically Abused: No    Sexually  Abused: No    Family History  Problem Relation Age of Onset   Heart disease Mother 84       AMI   Diabetes Mother    Hypertension Mother    Hyperlipidemia Mother     Past Surgical History:  Procedure Laterality Date   ABDOMINAL HYSTERECTOMY  2016   unilateral oophorectomy.  Does not know what side.  For fibroids and heavy bleeding.   APPENDECTOMY     age 89   BONE BIOPSY Right 03/19/2022   Procedure: RIGHT GREAT TOE BONE BIOPSY WITH WOUND DEBRIDEMENT AND GRAFT APPLICATION;  Surgeon: Edwin Cap, DPM;  Location: [facility];  Service: Podiatry;  Laterality: Right;  Right hallux amputation   CATARACT EXTRACTION W/PHACO Right 01/28/2020   Procedure: CATARACT EXTRACTION PHACO AND INTRAOCULAR LENS PLACEMENT (IOC) RIGHT VISION BLUE 13.73  01:10.8;  Surgeon: Elliot Cousin, MD;  Location: [facility];  Service: Ophthalmology;  Laterality: Right;  Diabetes Latex   CATARACT EXTRACTION W/PHACO Left 06/21/2020   Procedure: CATARACT EXTRACTION PHACO AND INTRAOCULAR LENS PLACEMENT (IOC) LEFT;  Surgeon: Galen Manila, MD;  Location: [facility];  Service: Ophthalmology;  Laterality: Left;  18.17 1:22.4   MUSCLE BIOPSY Left 03/28/2020   Procedure: LEFT THIGH MUSCLE BIOPSY;  Surgeon: Quentin Ore, MD;  Location: [facility];   Service: General;  Laterality: Left;   TONSILLECTOMY      ROS: Review of Systems Negative except as stated above  PHYSICAL EXAM: BP (!) 149/92 (BP Location: Left Arm, Patient Position: Sitting, Cuff Size: Normal)   Pulse 81   Temp 98.2 F (36.8 C) (Oral)   Ht 6\' 2"  (1.88 m)   Wt 238 lb (108 kg)   SpO2 97%   BMI 30.56 kg/m   Wt Readings from Last 3 Encounters:  10/23/22 238 lb (108 kg)  03/18/22 225 lb 1.4 oz (102.1 kg)  08/15/21 225 lb (102.1 kg)    Physical Exam  {female adult master:[id number]}     10/23/2022    1:46 PM 08/15/2021    2:14 PM  Depression screen PHQ 2/9  Decreased Interest 3 2  Down, Depressed, Hopeless 0 2  PHQ - 2 Score 3 4  Altered sleeping 1 3  Tired, decreased energy 3 3  Change in appetite 3 3  Feeling bad or failure about yourself  0 0  Trouble concentrating 0 0  Moving slowly or fidgety/restless 0 0  Suicidal thoughts 0 1  PHQ-9 Score 10 14       Latest Ref Rng & Units 03/22/2022    6:10 AM 03/20/2022    3:43 AM 03/20/2022   12:53 AM  CMP  Glucose 70 - 99 mg/dL  161  096   BUN 6 - 20 mg/dL  26    Creatinine 0.45 - 1.00 mg/dL 4.09  8.11    Sodium 914 - 145 mmol/L  135    Potassium 3.5 - 5.1 mmol/L  4.7    Chloride 98 - 111 mmol/L  105    CO2 22 - 32 mmol/L  25    Calcium 8.9 - 10.3 mg/dL  8.2     Lipid Panel     Component Value Date/Time   CHOL 187 08/15/2021 1529   TRIG 120 08/15/2021 1529   HDL 54 08/15/2021 1529   CHOLHDL 3.5 08/15/2021 1529   CHOLHDL 4.8 03/18/2020 0541   VLDL 24 03/18/2020 0541   LDLCALC 112 (H) 08/15/2021 1529    CBC    Component  Value Date/Time   WBC 7.4 03/20/2022 0343   RBC 4.68 03/20/2022 0343   HGB 13.4 03/20/2022 0343   HGB 13.5 08/03/2021 0950   HCT 38.8 03/20/2022 0343   HCT 40.7 08/03/2021 0950   PLT 176 03/20/2022 0343   PLT 190 09/08/2018 0926   MCV 82.9 03/20/2022 0343   MCV 89 08/03/2021 0950   MCH 28.6 03/20/2022 0343   MCHC 34.5 03/20/2022 0343   RDW 11.8 03/20/2022 0343    RDW 14.1 08/03/2021 0950   LYMPHSABS 1.4 03/18/2022 1215   LYMPHSABS 1.1 08/03/2021 0950   MONOABS 0.4 03/18/2022 1215   EOSABS 0.5 03/18/2022 1215   EOSABS 0.6 (H) 08/03/2021 0950   BASOSABS 0.0 03/18/2022 1215   BASOSABS 0.0 08/03/2021 0950    ASSESSMENT AND PLAN:  1. DM type 2 with diabetic peripheral neuropathy (HCC) *** - POCT glucose (manual entry) - POCT glycosylated hemoglobin (Hb A1C)  2. Hypertension associated with diabetes [facility]) ***    Patient was given the opportunity to ask questions.  Patient verbalized understanding of the plan and was able to repeat key elements of the plan.   This documentation was completed using Paediatric nurse.  Any transcriptional errors are unintentional.  Orders Placed This Encounter  Procedures   POCT glucose (manual entry)   POCT glycosylated hemoglobin (Hb A1C)     Requested Prescriptions    No prescriptions requested or ordered in this encounter    No follow-ups on file.  Jonah Blue, MD, FACP

## 2022-10-23 NOTE — Patient Instructions (Addendum)
Restart Lantus insulin 25 units at bedtime. Restart NovoLog insulin 10 units with meals. Prescription sent to your pharmacy for continuous glucose monitor. I have enclosed some information below about healthy eating habits.  Start omeprazole which is Prilosec 40 mg daily.  If the abdominal pain does not improve with this, please let me know as we will need to get an ultrasound to evaluate for gallstones.  Healthy Eating, Adult Healthy eating may help you get and keep a healthy body weight, reduce the risk of chronic disease, and live a long and productive life. It is important to follow a healthy eating pattern. Your nutritional and calorie needs should be met mainly by different nutrient-rich foods. What are tips for following this plan? Reading food labels Read labels and choose the following: Reduced or low sodium products. Juices with 100% fruit juice. Foods with low saturated fats (<3 g per serving) and high polyunsaturated and monounsaturated fats. Foods with whole grains, such as whole wheat, cracked wheat, brown rice, and wild rice. Whole grains that are fortified with folic acid. This is recommended for females who are pregnant or who want to become pregnant. Read labels and do not eat or drink the following: Foods or drinks with added sugars. These include foods that contain brown sugar, corn sweetener, corn syrup, dextrose, fructose, glucose, high-fructose corn syrup, honey, invert sugar, lactose, malt syrup, maltose, molasses, raw sugar, sucrose, trehalose, or turbinado sugar. Limit your intake of added sugars to less than 10% of your total daily calories. Do not eat more than the following amounts of added sugar per day: 6 teaspoons (25 g) for females. 9 teaspoons (38 g) for males. Foods that contain processed or refined starches and grains. Refined grain products, such as white flour, degermed cornmeal, white bread, and white rice. Shopping Choose nutrient-rich snacks, such as  vegetables, whole fruits, and nuts. Avoid high-calorie and high-sugar snacks, such as potato chips, fruit snacks, and candy. Use oil-based dressings and spreads on foods instead of solid fats such as butter, margarine, sour cream, or cream cheese. Limit pre-made sauces, mixes, and "instant" products such as flavored rice, instant noodles, and ready-made pasta. Try more plant-protein sources, such as tofu, tempeh, black beans, edamame, lentils, nuts, and seeds. Explore eating plans such as the Mediterranean diet or vegetarian diet. Try heart-healthy dips made with beans and healthy fats like hummus and guacamole. Vegetables go great with these. Cooking Use oil to saut or stir-fry foods instead of solid fats such as butter, margarine, or lard. Try baking, boiling, grilling, or broiling instead of frying. Remove the fatty part of meats before cooking. Steam vegetables in water or broth. Meal planning  At meals, imagine dividing your plate into fourths: One-half of your plate is fruits and vegetables. One-fourth of your plate is whole grains. One-fourth of your plate is protein, especially lean meats, poultry, eggs, tofu, beans, or nuts. Include low-fat dairy as part of your daily diet. Lifestyle Choose healthy options in all settings, including home, work, school, restaurants, or stores. Prepare your food safely: Wash your hands after handling raw meats. Where you prepare food, keep surfaces clean by regularly washing with hot, soapy water. Keep raw meats separate from ready-to-eat foods, such as fruits and vegetables. Cook seafood, meat, poultry, and eggs to the recommended temperature. Get a food thermometer. Store foods at safe temperatures. In general: Keep cold foods at 31F (4.4C) or below. Keep hot foods at 131F (60C) or above. Keep your freezer at [facility] (-17.8C) or below.  Foods are not safe to eat if they have been between the temperatures of 40-140F (4.4-60C) for more than 2  hours. What foods should I eat? Fruits Aim to eat 1-2 cups of fresh, canned (in natural juice), or frozen fruits each day. One cup of fruit equals 1 small apple, 1 large banana, 8 large strawberries, 1 cup (237 g) canned fruit,  cup (82 g) dried fruit, or 1 cup (240 mL) 100% juice. Vegetables Aim to eat 2-4 cups of fresh and frozen vegetables each day, including different varieties and colors. One cup of vegetables equals 1 cup (91 g) broccoli or cauliflower florets, 2 medium carrots, 2 cups (150 g) raw, leafy greens, 1 large tomato, 1 large bell pepper, 1 large sweet potato, or 1 medium white potato. Grains Aim to eat 5-10 ounce-equivalents of whole grains each day. Examples of 1 ounce-equivalent of grains include 1 slice of bread, 1 cup (40 g) ready-to-eat cereal, 3 cups (24 g) popcorn, or  cup (93 g) cooked rice. Meats and other proteins Try to eat 5-7 ounce-equivalents of protein each day. Examples of 1 ounce-equivalent of protein include 1 egg,  oz nuts (12 almonds, 24 pistachios, or 7 walnut halves), 1/4 cup (90 g) cooked beans, 6 tablespoons (90 g) hummus or 1 tablespoon (16 g) peanut butter. A cut of meat or fish that is the size of a deck of cards is about 3-4 ounce-equivalents (85 g). Of the protein you eat each week, try to have at least 8 sounce (227 g) of seafood. This is about 2 servings per week. This includes salmon, trout, herring, sardines, and anchovies. Dairy Aim to eat 3 cup-equivalents of fat-free or low-fat dairy each day. Examples of 1 cup-equivalent of dairy include 1 cup (240 mL) milk, 8 ounces (250 g) yogurt, 1 ounces (44 g) natural cheese, or 1 cup (240 mL) fortified soy milk. Fats and oils Aim for about 5 teaspoons (21 g) of fats and oils per day. Choose monounsaturated fats, such as canola and olive oils, mayonnaise made with olive oil or avocado oil, avocados, peanut butter, and most nuts, or polyunsaturated fats, such as sunflower, corn, and soybean oils, walnuts,  pine nuts, sesame seeds, sunflower seeds, and flaxseed. Beverages Aim for 6 eight-ounce glasses of water per day. Limit coffee to 3-5 eight-ounce cups per day. Limit caffeinated beverages that have added calories, such as soda and energy drinks. If you drink alcohol: Limit how much you have to: 0-1 drink a day if you are female. 0-2 drinks a day if you are female. Know how much alcohol is in your drink. In the [place] one drink is one 12 oz bottle of beer (355 mL), one 5 oz glass of wine (148 mL), or one 1 oz glass of hard liquor (44 mL). Seasoning and other foods Try not to add too much salt to your food. Try using herbs and spices instead of salt. Try not to add sugar to food. This information is based on [place] nutrition guidelines. To learn more, visit DisposableNylon.be. Exact amounts may vary. You may need different amounts. This information is not intended to replace advice given to you by your health care provider. Make sure you discuss any questions you have with your health care provider. Document Revised: 01/08/2022 Document Reviewed: 01/08/2022 Elsevier Patient Education  2024 [employer].

## 2022-10-24 ENCOUNTER — Encounter: Payer: Self-pay | Admitting: Internal Medicine

## 2022-10-24 LAB — CBC
Hematocrit: 43.9 % (ref 34.0–46.6)
Hemoglobin: 15 g/dL (ref 11.1–15.9)
MCH: 29.5 pg (ref 26.6–33.0)
MCHC: 34.2 g/dL (ref 31.5–35.7)
MCV: 86 fL (ref 79–97)
Platelets: 188 10*3/uL (ref 150–450)
RBC: 5.09 x10E6/uL (ref 3.77–5.28)
RDW: 13.1 % (ref 11.7–15.4)
WBC: 6.7 10*3/uL (ref 3.4–10.8)

## 2022-10-24 LAB — COMPREHENSIVE METABOLIC PANEL
ALT: 25 IU/L (ref 0–32)
AST: 23 IU/L (ref 0–40)
Albumin: 3.7 g/dL — ABNORMAL LOW (ref 3.8–4.9)
Alkaline Phosphatase: 186 IU/L — ABNORMAL HIGH (ref 44–121)
BUN/Creatinine Ratio: 21 (ref 9–23)
BUN: 21 mg/dL (ref 6–24)
Bilirubin Total: 0.8 mg/dL (ref 0.0–1.2)
CO2: 22 mmol/L (ref 20–29)
Calcium: 9.1 mg/dL (ref 8.7–10.2)
Chloride: 94 mmol/L — ABNORMAL LOW (ref 96–106)
Creatinine, Ser: 1.02 mg/dL — ABNORMAL HIGH (ref 0.57–1.00)
Globulin, Total: 2.8 g/dL (ref 1.5–4.5)
Glucose: 493 mg/dL — ABNORMAL HIGH (ref 70–99)
Potassium: 3.7 mmol/L (ref 3.5–5.2)
Sodium: 132 mmol/L — ABNORMAL LOW (ref 134–144)
Total Protein: 6.5 g/dL (ref 6.0–8.5)
eGFR: 66 mL/min/{1.73_m2} (ref >59)

## 2022-10-24 LAB — LIPID PANEL
Chol/HDL Ratio: 4.3 ratio (ref 0.0–4.4)
Cholesterol, Total: 255 mg/dL — ABNORMAL HIGH (ref 100–199)
HDL: 59 mg/dL (ref >39)
LDL Chol Calc (NIH): 153 mg/dL — ABNORMAL HIGH (ref 0–99)
Triglycerides: 239 mg/dL — ABNORMAL HIGH (ref 0–149)
VLDL Cholesterol Cal: 43 mg/dL — ABNORMAL HIGH (ref 5–40)

## 2022-10-24 LAB — MICROALBUMIN / CREATININE URINE RATIO
Creatinine, Urine: 26.7 mg/dL
Microalb/Creat Ratio: 3334 mg/g creat — ABNORMAL HIGH (ref 0–29)
Microalbumin, Urine: 890.3 ug/mL

## 2022-11-21 DIAGNOSIS — R21 Rash and other nonspecific skin eruption: Secondary | ICD-10-CM | POA: Diagnosis not present

## 2022-11-21 DIAGNOSIS — L97509 Non-pressure chronic ulcer of other part of unspecified foot with unspecified severity: Secondary | ICD-10-CM | POA: Diagnosis not present

## 2022-11-21 DIAGNOSIS — M1990 Unspecified osteoarthritis, unspecified site: Secondary | ICD-10-CM | POA: Diagnosis not present

## 2022-11-21 DIAGNOSIS — M609 Myositis, unspecified: Secondary | ICD-10-CM | POA: Diagnosis not present

## 2022-11-21 DIAGNOSIS — M0579 Rheumatoid arthritis with rheumatoid factor of multiple sites without organ or systems involvement: Secondary | ICD-10-CM | POA: Diagnosis not present

## 2022-11-21 DIAGNOSIS — Z79899 Other long term (current) drug therapy: Secondary | ICD-10-CM | POA: Diagnosis not present

## 2022-11-21 DIAGNOSIS — E1169 Type 2 diabetes mellitus with other specified complication: Secondary | ICD-10-CM | POA: Diagnosis not present

## 2022-11-21 DIAGNOSIS — M3390 Dermatopolymyositis, unspecified, organ involvement unspecified: Secondary | ICD-10-CM | POA: Diagnosis not present

## 2022-11-23 ENCOUNTER — Other Ambulatory Visit: Payer: Self-pay

## 2022-11-23 NOTE — Progress Notes (Signed)
   Kaitlin Holloway 1971-04-19 [id number]  Patient outreached by Collie Siad , PharmD Candidate on 11/23/2022.  Blood Pressure Readings:   Patient was unaware that she needed to be logging her blood pressure. She does have a blood pressure cuff at home. She did agree to start logging her blood pressure daily. I recommended she bring her BP log with her to her next PCP appointment.   Medication review was performed. Is the patient taking their medications as prescribed?: Yes Differences from their prescribed list include: Losartan 50 mg tablets by mouth daily (increased from 25 mg).  The following barriers to adherence were noted: Does the patient have cost concerns?: No Does the patient have transportation concerns?: No Does the patient need assistance obtaining refills?: No Does the patient occassionally forget to take some of their prescribed medications?: Yes Does the patient feel like one/some of their medications make them feel poorly?: No Does the patient have questions or concerns about their medications?: No Does the patient have a follow up scheduled with their primary care provider/cardiologist?: Yes   Interventions: Interventions Completed: Medications were reviewed  The patient has follow up scheduled:  PCP: Marcine Matar, MD   [facility], [facility]

## 2022-11-26 ENCOUNTER — Ambulatory Visit: Payer: 59 | Attending: Internal Medicine | Admitting: Internal Medicine

## 2022-11-26 ENCOUNTER — Encounter: Payer: Self-pay | Admitting: Internal Medicine

## 2022-11-26 VITALS — BP 128/83 | HR 126 | Temp 98.0°F | Ht 74.0 in | Wt 243.0 lb

## 2022-11-26 DIAGNOSIS — E1169 Type 2 diabetes mellitus with other specified complication: Secondary | ICD-10-CM | POA: Diagnosis not present

## 2022-11-26 DIAGNOSIS — E1121 Type 2 diabetes mellitus with diabetic nephropathy: Secondary | ICD-10-CM | POA: Insufficient documentation

## 2022-11-26 DIAGNOSIS — E785 Hyperlipidemia, unspecified: Secondary | ICD-10-CM | POA: Diagnosis not present

## 2022-11-26 DIAGNOSIS — E1165 Type 2 diabetes mellitus with hyperglycemia: Secondary | ICD-10-CM

## 2022-11-26 DIAGNOSIS — I152 Hypertension secondary to endocrine disorders: Secondary | ICD-10-CM | POA: Diagnosis not present

## 2022-11-26 DIAGNOSIS — E1159 Type 2 diabetes mellitus with other circulatory complications: Secondary | ICD-10-CM | POA: Diagnosis not present

## 2022-11-26 DIAGNOSIS — Z5320 Procedure and treatment not carried out because of patient's decision for unspecified reasons: Secondary | ICD-10-CM | POA: Insufficient documentation

## 2022-11-26 DIAGNOSIS — Z794 Long term (current) use of insulin: Secondary | ICD-10-CM

## 2022-11-26 NOTE — Patient Instructions (Addendum)
I will have our clinical pharmacist check with your insurance to try to get approval for the continuous glucose monitor.  If it is not approved, try to check your blood sugars manually at least once a day alternating before breakfast and before dinner.  Goal for blood sugars before meals is 90-130.  Please take glargine insulin 25 units daily.  Please return to the lab in about 1 month to submit a urine sample for recheck of the amount of protein in the urine.
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
## 2022-11-27 ENCOUNTER — Other Ambulatory Visit: Payer: Self-pay

## 2022-11-27 ENCOUNTER — Other Ambulatory Visit: Payer: Self-pay | Admitting: Pharmacist

## 2022-11-27 DIAGNOSIS — E1165 Type 2 diabetes mellitus with hyperglycemia: Secondary | ICD-10-CM

## 2022-11-27 MED ORDER — DEXCOM G7 RECEIVER DEVI
0 refills | Status: AC
Start: 2022-11-27 — End: ?
  Filled 2022-11-27: qty 1, 28d supply, fill #0
  Filled 2022-11-28 – 2022-12-17 (×2): qty 1, 30d supply, fill #0

## 2022-11-27 MED ORDER — DEXCOM G7 SENSOR MISC
6 refills | Status: DC
  Filled 2022-11-27 (×2): qty 2, 20d supply, fill #0

## 2022-11-28 ENCOUNTER — Other Ambulatory Visit: Payer: Self-pay | Admitting: Internal Medicine

## 2022-11-28 ENCOUNTER — Telehealth: Payer: Self-pay | Admitting: Internal Medicine

## 2022-11-28 ENCOUNTER — Other Ambulatory Visit: Payer: Self-pay

## 2022-11-28 ENCOUNTER — Other Ambulatory Visit: Payer: Self-pay | Admitting: Pharmacist

## 2022-11-28 DIAGNOSIS — E1165 Type 2 diabetes mellitus with hyperglycemia: Secondary | ICD-10-CM

## 2022-11-28 MED ORDER — DEXCOM G7 SENSOR MISC
6 refills | Status: AC
  Filled 2022-11-28 – 2022-12-17 (×3): qty 3, 30d supply, fill #0

## 2022-11-28 NOTE — Telephone Encounter (Signed)
-----   Message from Weldon Picking sent at 11/28/2022  3:18 PM EDT ----- I sent the patient a MyChart message, her mailbox is full. Sensors and receiver will be ready for pick-up tomorrow morning at no charge. ----- Message ----- From: Marcine Matar, MD Sent: 11/28/2022  11:44 AM EDT To: Drucilla Chalet, RPH-CPP; #  Thanks to you both.  Tresa Endo please let her know if approved. ----- Message ----- From: Drucilla Chalet, RPH-CPP Sent: 11/27/2022   4:15 PM EDT To: Marcine Matar, MD; Weldon Picking, CPhT  Hey friends.   Rxn for Dexcom G7 sent to our pharmacy. ----- Message ----- From: Weldon Picking, CPhT Sent: 11/27/2022   8:21 AM EDT To: Drucilla Chalet, RPH-CPP; #  Dexcom is preferred, can the rx be changed? Anyway the script can be sent to our pharmacy? She has an over $4000 deductible but has Medicaid as secondary. Pharmacy will have to call Medicaid to override with them for coverage due to primary's high copay, not all pharmacies know to do this. ----- Message ----- From: Marcine Matar, MD Sent: 11/26/2022   5:28 PM EDT To: Weldon Picking, CPhT  I will prescribe continuous glucose monitor Libre for this patient a month ago.  She tells me that it cost $200.  She has insurance.  Can you please check and see whether we need to do a prior approval to get her insurance to pay for it?  She is on insulin.

## 2022-11-28 NOTE — Telephone Encounter (Signed)
Letter sent.

## 2022-11-28 NOTE — Telephone Encounter (Signed)
-----   Message from Horald Pollen Ausdall sent at 11/27/2022  4:15 PM EDT ----- Hey friends.   Rxn for Dexcom G7 sent to our pharmacy. ----- Message ----- From: Weldon Picking, CPhT Sent: 11/27/2022   8:21 AM EDT To: Drucilla Chalet, RPH-CPP; #  Dexcom is preferred, can the rx be changed? Anyway the script can be sent to our pharmacy? She has an over $4000 deductible but has Medicaid as secondary. Pharmacy will have to call Medicaid to override with them for coverage due to primary's high copay, not all pharmacies know to do this. ----- Message ----- From: Marcine Matar, MD Sent: 11/26/2022   5:28 PM EDT To: Weldon Picking, CPhT  I will prescribe continuous glucose monitor Libre for this patient a month ago.  She tells me that it cost $200.  She has insurance.  Can you please check and see whether we need to do a prior approval to get her insurance to pay for it?  She is on insulin.

## 2022-11-29 ENCOUNTER — Other Ambulatory Visit: Payer: Self-pay

## 2022-12-04 ENCOUNTER — Other Ambulatory Visit: Payer: Self-pay

## 2022-12-11 DIAGNOSIS — E1165 Type 2 diabetes mellitus with hyperglycemia: Secondary | ICD-10-CM | POA: Diagnosis not present

## 2022-12-11 DIAGNOSIS — I10 Essential (primary) hypertension: Secondary | ICD-10-CM | POA: Diagnosis not present

## 2022-12-11 DIAGNOSIS — N1832 Chronic kidney disease, stage 3b: Secondary | ICD-10-CM | POA: Diagnosis not present

## 2022-12-14 DIAGNOSIS — E1165 Type 2 diabetes mellitus with hyperglycemia: Secondary | ICD-10-CM | POA: Diagnosis not present

## 2022-12-17 ENCOUNTER — Other Ambulatory Visit (HOSPITAL_COMMUNITY): Payer: Self-pay

## 2022-12-17 DIAGNOSIS — E1165 Type 2 diabetes mellitus with hyperglycemia: Secondary | ICD-10-CM | POA: Diagnosis not present

## 2022-12-17 DIAGNOSIS — I10 Essential (primary) hypertension: Secondary | ICD-10-CM | POA: Diagnosis not present

## 2022-12-17 DIAGNOSIS — N1832 Chronic kidney disease, stage 3b: Secondary | ICD-10-CM | POA: Diagnosis not present

## 2022-12-17 MED ORDER — MOUNJARO 2.5 MG/0.5ML ~~LOC~~ SOAJ
2.5000 mg | SUBCUTANEOUS | 1 refills | Status: DC
Start: 2022-12-17 — End: 2023-04-09
  Filled 2022-12-17 (×4): qty 2, 28d supply, fill #0

## 2022-12-26 ENCOUNTER — Other Ambulatory Visit (HOSPITAL_COMMUNITY): Payer: Self-pay

## 2022-12-26 MED ORDER — TRULICITY 0.75 MG/0.5ML ~~LOC~~ SOPN
0.7500 mg | PEN_INJECTOR | SUBCUTANEOUS | 1 refills | Status: AC
Start: 2022-12-26 — End: ?
  Filled 2022-12-26 – 2023-01-11 (×3): qty 2, 28d supply, fill #0

## 2022-12-27 ENCOUNTER — Other Ambulatory Visit: Payer: 59

## 2022-12-28 ENCOUNTER — Other Ambulatory Visit (HOSPITAL_COMMUNITY): Payer: Self-pay

## 2023-01-10 ENCOUNTER — Other Ambulatory Visit (HOSPITAL_COMMUNITY): Payer: Self-pay

## 2023-01-11 ENCOUNTER — Other Ambulatory Visit (HOSPITAL_COMMUNITY): Payer: Self-pay

## 2023-01-23 ENCOUNTER — Other Ambulatory Visit (HOSPITAL_COMMUNITY): Payer: Self-pay

## 2023-01-28 ENCOUNTER — Ambulatory Visit: Payer: Self-pay | Admitting: Internal Medicine

## 2023-03-09 DIAGNOSIS — Z20822 Contact with and (suspected) exposure to covid-19: Secondary | ICD-10-CM | POA: Diagnosis not present

## 2023-03-09 DIAGNOSIS — H6092 Unspecified otitis externa, left ear: Secondary | ICD-10-CM | POA: Diagnosis not present

## 2023-04-09 ENCOUNTER — Ambulatory Visit (HOSPITAL_BASED_OUTPATIENT_CLINIC_OR_DEPARTMENT_OTHER): Payer: 59

## 2023-04-09 ENCOUNTER — Ambulatory Visit: Payer: 59 | Attending: Internal Medicine | Admitting: Internal Medicine

## 2023-04-09 VITALS — BP 130/84 | HR 84 | Temp 98.4°F | Ht 74.0 in | Wt 240.0 lb

## 2023-04-09 DIAGNOSIS — Z23 Encounter for immunization: Secondary | ICD-10-CM

## 2023-04-09 DIAGNOSIS — E1142 Type 2 diabetes mellitus with diabetic polyneuropathy: Secondary | ICD-10-CM | POA: Diagnosis not present

## 2023-04-09 DIAGNOSIS — M5431 Sciatica, right side: Secondary | ICD-10-CM

## 2023-04-09 DIAGNOSIS — Z111 Encounter for screening for respiratory tuberculosis: Secondary | ICD-10-CM

## 2023-04-09 DIAGNOSIS — E1159 Type 2 diabetes mellitus with other circulatory complications: Secondary | ICD-10-CM | POA: Diagnosis not present

## 2023-04-09 DIAGNOSIS — Z0000 Encounter for general adult medical examination without abnormal findings: Secondary | ICD-10-CM | POA: Diagnosis not present

## 2023-04-09 DIAGNOSIS — K029 Dental caries, unspecified: Secondary | ICD-10-CM

## 2023-04-09 DIAGNOSIS — Z5320 Procedure and treatment not carried out because of patient's decision for unspecified reasons: Secondary | ICD-10-CM

## 2023-04-09 DIAGNOSIS — Z7984 Long term (current) use of oral hypoglycemic drugs: Secondary | ICD-10-CM

## 2023-04-09 DIAGNOSIS — L84 Corns and callosities: Secondary | ICD-10-CM

## 2023-04-09 DIAGNOSIS — Z2821 Immunization not carried out because of patient refusal: Secondary | ICD-10-CM

## 2023-04-09 DIAGNOSIS — I152 Hypertension secondary to endocrine disorders: Secondary | ICD-10-CM

## 2023-04-09 MED ORDER — LOSARTAN POTASSIUM 50 MG PO TABS
50.0000 mg | ORAL_TABLET | Freq: Every day | ORAL | 1 refills | Status: DC
Start: 2023-04-09 — End: 2024-03-09

## 2023-04-09 MED ORDER — GABAPENTIN 300 MG PO CAPS
300.0000 mg | ORAL_CAPSULE | Freq: Every day | ORAL | 1 refills | Status: AC
Start: 2023-04-09 — End: ?

## 2023-04-09 NOTE — Progress Notes (Unsigned)
Patient ID: Kaitlin Holloway, female    DOB: [birth year]  MRN: [id number]  CC: Annual Exam (Physical. /No questions /  concerns/No to flu, Tdap, colonoscopy, pap.)   Subjective: Kaitlin Holloway is a 52 y.o. female who presents for annual exam/work physical Her concerns today include:  Patient with history of DM type II, HTN, thyromegaly, former smoker, arthritis, inflammatory myositis, rheumatoid arthritis (followed by Dr. Aryl)   HM:  no pap had hysterectomy 2007 for noncancer reason.  Could not afford DM eye exam co-pay of $100. Declines all vaccines - flu, Shingrix, COVID Declines all colon CA screen methods and MMG  DM: seeing endo at [facility]. Last seen 1-1.5 mths ago.  A1C elev but she does not recall what the # was She self stopped the Humalog and Glargine insulin that she was on when I last saw her. Does not want to be on insulin Started on Farxiga 5 mg and Trulicity(does not recall dose) by endocrinology Not checking BS Doing better with eating habits - dec portions, stopped eating out as much. Not exercising due to healing callous on RT big toe which she states was present for over a year; slowly getting better and c/o sciatica RT side x 3 wks. Starts in lower back and goes down back of leg to thigh.  Almost slipped in kitchen 3 wks ago; thinks this may have been the initiating factor. Had injury to lower back several yrs ago.  Since then, she has numbness in feet.  Reports being told that the numbness may be due to DM but pt does not think so. Trying to rest it and taking Ibuprofen.  Plans to start doing yoga and walking again  RA:  self stopped MXT x several mths. Has not told her rheumatologist Dr. Aryl. Decided she is tired of taking meds and not getting better.    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 60 tablet 1   Continuous Glucose Receiver (DEXCOM G7 RECEIVER) DEVI Use to check blood sugar continuously throughout the day. (Patient not taking: Reported on 04/09/2023) 1 each 0   Continuous Glucose Sensor (DEXCOM G7 SENSOR) MISC Use to check blood sugar continuously throughout the day. Change sensors once every 10 days. (Patient not taking: Reported on 04/09/2023) 3 each 6   Dulaglutide (TRULICITY) 0.75 MG/0.5ML SOPN Inject 0.75 mg into the skin once a week. (Patient not taking: Reported on 04/09/2023) 2 mL 1   fluticasone (FLONASE) 50 MCG/ACT nasal spray Place 1 spray into both nostrils daily. (Patient not taking: Reported on 04/09/2023) 16 g 1   folic acid (FOLVITE) 1 MG tablet Take 1 mg by mouth daily. (Patient not  taking: Reported on 04/09/2023)     Insulin Pen Needle (PEN NEEDLES) 31G X 8 MM MISC 1 each by Does not apply route 4 (four) times daily -  before meals and at bedtime. (Patient not taking: Reported on 04/09/2023) 100 each 1   methotrexate (RHEUMATREX) 2.5 MG tablet Take 15 mg by mouth once a week. (Patient not taking: Reported on 04/09/2023)     omeprazole (PRILOSEC) 40 MG capsule Take 1 capsule (40 mg total) by mouth daily. (Patient not taking: Reported on 04/09/2023) 30 capsule 3    [DISCONTINUED] lisinopril-hydrochlorothiazide (ZESTORETIC) 20-12.5 MG tablet Take 1 tablet by mouth daily. (Patient not taking: Reported on 07/11/2018) 30 tablet 3   No current facility-administered medications on file prior to visit.    Allergies  Allergen Reactions   Aspirin Other (See Comments)    Muscle spasms in back  Other reaction(s): Muscle Pain, Other (See Comments)  Muscle spasms in back  Significant cramping and lower back pain   Penicillins Other (See Comments)    Has patient had a PCN reaction causing immediate rash, facial/tongue/throat swelling, SOB or lightheadedness with hypotension: Yes Has patient had a PCN reaction causing severe rash involving mucus membranes or skin necrosis: No Has patient had a PCN reaction that required hospitalization: No Has patient had a PCN reaction occurring within the last 10 years: No If all of the above answers are "NO", then may proceed with Cephalosporin use.    Garlic Swelling   Metformin Other (See Comments)    Other reaction(s): abdominal pain, Other (See Comments)  Severe abdominal pain  GI upset   Morphine And Codeine Other (See Comments)    Headaches/ migraine   Gadavist [Gadobutrol] Nausea And Vomiting    Nausea and vomiting immediately after contrast injection    Latex Rash    Gloves - when worn    Social History   Socioeconomic History   Marital status: Single    Spouse name: Not on file   Number of children: 1   Years of education: Not on file   Highest education level: Master's degree (e.g., MA, MS, MEng, MEd, MSW, MBA)  Occupational History   Occupation: Database administrator.  Tobacco Use   Smoking status: Former    Current packs/day: 0.00    Average packs/day: 1.5 packs/day for 17.0 years (25.5 ttl pk-yrs)    Types: Cigarettes    Start date: 07/11/1983    Quit date: 07/10/2000    Years since quitting: 22.7   Smokeless tobacco: Never  Vaping Use   Vaping status: Never Used  Substance and  Sexual Activity   Alcohol use: Yes    Alcohol/week: 2.0 standard drinks of alcohol    Types: 2 Cans of beer per week   Drug use: No    Comment: MJ when young   Sexual activity: Not on file  Other Topics Concern   Not on file  Social History Narrative   Lives alone, no support.Marland Kitchen   Has been hard during COVID19 pandemic.       Right handed   Caffeine: 1 cup coffee maybe twice a week   Social Drivers of Health   Financial Resource Strain: High Risk (04/09/2023)   Overall Financial Resource Strain (CARDIA)    Difficulty of Paying Living Expenses: Very hard  Food Insecurity: Food Insecurity Present (04/09/2023)   Hunger Vital Sign    Worried About Running Out of Food in the Last Year: Never true    Ran Out of Food  in the Last Year: Sometimes true  Transportation Needs: No Transportation Needs (04/09/2023)   PRAPARE - Administrator, Civil Service (Medical): No    Lack of Transportation (Non-Medical): No  Physical Activity: Insufficiently Active (04/09/2023)   Exercise Vital Sign    Days of Exercise per Week: 2 days    Minutes of Exercise per Session: 30 min  Stress: No Stress Concern Present (04/09/2023)   [employer] of Occupational Health - Occupational Stress Questionnaire    Feeling of Stress : Not at all  Social Connections: Moderately Isolated (04/09/2023)   Social Connection and Isolation Panel [NHANES]    Frequency of Communication with Friends and Family: Once a week    Frequency of Social Gatherings with Friends and Family: Once a week    Attends Religious Services: More than 4 times per year    Active Member of [employer] or Organizations: Yes    Attends Banker Meetings: 1 to 4 times per year    Marital Status: Never married  Intimate Partner Violence: Not At Risk (04/09/2023)   Humiliation, Afraid, Rape, and Kick questionnaire    Fear of Current or Ex-Partner: No    Emotionally Abused: No    Physically Abused: No    Sexually Abused: No     Family History  Problem Relation Age of Onset   Heart disease Mother 6       AMI   Diabetes Mother    Hypertension Mother    Hyperlipidemia Mother     Past Surgical History:  Procedure Laterality Date   ABDOMINAL HYSTERECTOMY  2016   unilateral oophorectomy.  Does not know what side.  For fibroids and heavy bleeding.   APPENDECTOMY     age 72   BONE BIOPSY Right 03/19/2022   Procedure: RIGHT GREAT TOE BONE BIOPSY WITH WOUND DEBRIDEMENT AND GRAFT APPLICATION;  Surgeon: Edwin Cap, DPM;  Location: [facility];  Service: Podiatry;  Laterality: Right;  Right hallux amputation   CATARACT EXTRACTION W/PHACO Right 01/28/2020   Procedure: CATARACT EXTRACTION PHACO AND INTRAOCULAR LENS PLACEMENT (IOC) RIGHT VISION BLUE 13.73  01:10.8;  Surgeon: Elliot Cousin, MD;  Location: [facility];  Service: Ophthalmology;  Laterality: Right;  Diabetes Latex   CATARACT EXTRACTION W/PHACO Left 06/21/2020   Procedure: CATARACT EXTRACTION PHACO AND INTRAOCULAR LENS PLACEMENT (IOC) LEFT;  Surgeon: Galen Manila, MD;  Location: [facility];  Service: Ophthalmology;  Laterality: Left;  18.17 1:22.4   MUSCLE BIOPSY Left 03/28/2020   Procedure: LEFT THIGH MUSCLE BIOPSY;  Surgeon: Quentin Ore, MD;  Location: [facility];  Service: General;  Laterality: Left;   TONSILLECTOMY      ROS: Review of Systems  HENT:  Negative for congestion, hearing loss, sore throat and trouble swallowing.   Eyes:        C/o puffiness lower eyes since having cataract extractions in the past.  Can not afford her $100 co-pay for DM eye exam  Respiratory:  Negative for cough and shortness of breath.   Cardiovascular:  Negative for chest pain.  Gastrointestinal:  Negative for abdominal pain.       Constipation at times which she thinks may be due to Ibuprofen  Genitourinary:  Negative for difficulty urinating.  Psychiatric/Behavioral:         No issues with depression at this time    PHYSICAL EXAM: BP  130/84   Pulse 84   Temp 98.4 F (36.9 C) (Oral)   Ht 6\' 2"  (1.88  m)   Wt 240 lb (108.9 kg)   SpO2 99%   BMI 30.81 kg/m   Physical Exam  General appearance - alert, well appearing, and in no distress Mental status - normal mood, behavior, speech, dress, motor activity, and thought processes Eyes - pupils equal and reactive, extraocular eye movements intact Ears - bilateral TM's and external ear canals normal Nose - normal and patent, no erythema, discharge or polyps Mouth - mucous membranes moist, pharynx normal without lesions.  Decayed third molar right upper jaw. Neck - supple, no significant adenopathy Lymphatics - no palpable lymphadenopathy, no hepatosplenomegaly Chest - clear to auscultation, no wheezes, rales or rhonchi, symmetric air entry Heart - normal rate, regular rhythm, normal S1, S2, no murmurs, rubs, clicks or gallops Abdomen - soft, nontender, nondistended, no masses or organomegaly Neurological -power in the lower extremities 5/5 bilaterally.  Gait is stable. Musculoskeletal -no tenderness on palpation of lumbar spine paraspinal muscles. Extremities -no lower extremity edema.  Good pulses in the lower extremities. Skin -patient has a fairly large preulcerative callus toe.  Area is discolored. Diabetic Foot Exam - Simple   Simple Foot Form Diabetic Foot exam was performed with the following findings: Yes 04/09/2023  3:00 PM  Visual Inspection See comments: Yes Sensation Testing See comments: Yes Pulse Check Posterior Tibialis and Dorsalis pulse intact bilaterally: Yes Comments Large preulcerative callus on the plantar surface of the right big toe.  No drainage noted. Decreased sensation on plantar surface of both feet with leap exam.        04/09/2023    3:29 PM 11/26/2022    3:00 PM 10/23/2022    1:46 PM  Depression screen PHQ 2/9  Decreased Interest 0 1 3  Down, Depressed, Hopeless 0 0 0  PHQ - 2 Score 0 1 3  Altered sleeping 1 1 1   Tired, decreased  energy 1 1 3   Change in appetite 0 2 3  Feeling bad or failure about yourself  0 0 0  Trouble concentrating 0 0 0  Moving slowly or fidgety/restless 0 0 0  Suicidal thoughts 0 0 0  PHQ-9 Score 2 5 10   Difficult doing work/chores Not difficult at all         Latest Ref Rng & Units 10/23/2022    3:35 PM 03/22/2022    6:10 AM 03/20/2022    3:43 AM  CMP  Glucose 70 - 99 mg/dL 027   253   BUN 6 - 24 mg/dL 21   26   Creatinine 6.64 - 1.00 mg/dL 4.03  4.74  2.59   Sodium 134 - 144 mmol/L 132   135   Potassium 3.5 - 5.2 mmol/L 3.7   4.7   Chloride 96 - 106 mmol/L 94   105   CO2 20 - 29 mmol/L 22   25   Calcium 8.7 - 10.2 mg/dL 9.1   8.2   Total Protein 6.0 - 8.5 g/dL 6.5     Total Bilirubin 0.0 - 1.2 mg/dL 0.8     Alkaline Phos 44 - 121 IU/L 186     AST 0 - 40 IU/L 23     ALT 0 - 32 IU/L 25      Lipid Panel     Component Value Date/Time   CHOL 255 (H) 10/23/2022 1535   TRIG 239 (H) 10/23/2022 1535   HDL 59 10/23/2022 1535   CHOLHDL 4.3 10/23/2022 1535   CHOLHDL 4.8 03/18/2020 0541   VLDL 24 03/18/2020  0541   LDLCALC 153 (H) 10/23/2022 1535    CBC    Component Value Date/Time   WBC 6.7 10/23/2022 1535   WBC 7.4 03/20/2022 0343   RBC 5.09 10/23/2022 1535   RBC 4.68 03/20/2022 0343   HGB 15.0 10/23/2022 1535   HCT 43.9 10/23/2022 1535   PLT 188 10/23/2022 1535   MCV 86 10/23/2022 1535   MCH 29.5 10/23/2022 1535   MCH 28.6 03/20/2022 0343   MCHC 34.2 10/23/2022 1535   MCHC 34.5 03/20/2022 0343   RDW 13.1 10/23/2022 1535   LYMPHSABS 1.4 03/18/2022 1215   LYMPHSABS 1.1 08/03/2021 0950   MONOABS 0.4 03/18/2022 1215   EOSABS 0.5 03/18/2022 1215   EOSABS 0.6 (H) 08/03/2021 0950   BASOSABS 0.0 03/18/2022 1215   BASOSABS 0.0 08/03/2021 0950    ASSESSMENT AND PLAN: 1. Annual physical exam (Primary) Patient declines mammogram stating that it is too uncomfortable.  Does realize that it is for breast cancer screening. Patient declines all form of colon cancer screening  including colonoscopy and Cologuard.  Declines age-appropriate/seasonal appropriate vaccinations including influenza, Shingrix and COVID  2. Hypertension associated with diabetes (HCC) Repeat blood pressure close to goal.  We agreed to hold off on increasing Cozaar.  Advised to check blood pressure a few times a week. - losartan (COZAAR) 50 MG tablet; Take 1 tablet (50 mg total) by mouth daily.  Dispense: 90 tablet; Refill: 1  3. DM type 2 with diabetic peripheral neuropathy (HCC) Level of control unknown at this time.  She is plugged in with an endocrinologist.  Encouraged to take the medicines consistently that the specialist has prescribed. We discussed putting her on gabapentin at bedtime for short.  To help more so with sciatica but also with neuropathy symptoms in the feet.  She is agreeable to trying it.  Advised that it can cause some drowsiness. - gabapentin (NEURONTIN) 300 MG capsule; Take 1 capsule (300 mg total) by mouth at bedtime.  Dispense: 30 capsule; Refill: 1  4. Sciatica of right side See #3 above.  Avoid heavy lifting.  5. Mammogram declined Recommended.  Patient declined.  6. Influenza vaccination declined  7. Colon cancer screening declined  8. Tooth decay Recommend getting in with a dentist at least twice a year for routine screening and cleaning.  9. Pre-ulcerative corn or callous Recommend referral to podiatrist but patient declined stating that it was worse when specialist was treating it.  Patient was given the opportunity to ask questions.  Patient verbalized understanding of the plan and was able to repeat key elements of the plan.   This documentation was completed using Paediatric nurse.  Any transcriptional errors are unintentional.  No orders of the defined types were placed in this encounter.    Requested Prescriptions   Signed Prescriptions Disp Refills   losartan (COZAAR) 50 MG tablet 90 tablet 1    Sig: Take 1 tablet (50  mg total) by mouth daily.   gabapentin (NEURONTIN) 300 MG capsule 30 capsule 1    Sig: Take 1 capsule (300 mg total) by mouth at bedtime.    Return in about 6 months (around 10/08/2023).  Jonah Blue, MD, FACP

## 2023-04-09 NOTE — Progress Notes (Signed)
PPD Placement note Kaitlin Holloway, 52 y.o. female is here today for placement of PPD test Reason for PPD test: work Pt taken PPD test before: yes Has the patient been in recent contact with anyone known or suspected of having active TB disease?: no    P:  PPD placed on 04/09/2023.  Patient advised to return for reading within 48-72 hours.

## 2023-04-10 ENCOUNTER — Encounter: Payer: Self-pay | Admitting: Internal Medicine

## 2023-04-11 ENCOUNTER — Ambulatory Visit: Payer: 59 | Attending: Internal Medicine

## 2023-04-11 DIAGNOSIS — Z111 Encounter for screening for respiratory tuberculosis: Secondary | ICD-10-CM

## 2023-04-11 LAB — TB SKIN TEST
Induration: 0 mm
TB Skin Test: NEGATIVE

## 2023-04-11 NOTE — Progress Notes (Signed)
PPD Reading Note  PPD read and results entered in EpicCare.  Result: 0 mm induration.  Interpretation: negative  Allergic reaction: no

## 2023-08-23 ENCOUNTER — Telehealth: Payer: Self-pay

## 2023-08-23 NOTE — Telephone Encounter (Signed)
 Patient was identified as falling into the [place] Measure - Diabetes.   Patient was: to schedule an earlier appointment.  Left voicemail to schedule with primary care provider.   As soon as possible.

## 2023-10-08 ENCOUNTER — Ambulatory Visit: Payer: 59 | Admitting: Internal Medicine

## 2024-03-09 ENCOUNTER — Telehealth: Payer: Self-pay | Admitting: Nurse Practitioner

## 2024-03-09 DIAGNOSIS — J40 Bronchitis, not specified as acute or chronic: Secondary | ICD-10-CM

## 2024-03-09 DIAGNOSIS — E1159 Type 2 diabetes mellitus with other circulatory complications: Secondary | ICD-10-CM

## 2024-03-09 DIAGNOSIS — I152 Hypertension secondary to endocrine disorders: Secondary | ICD-10-CM

## 2024-03-09 DIAGNOSIS — Z9109 Other allergy status, other than to drugs and biological substances: Secondary | ICD-10-CM

## 2024-03-09 DIAGNOSIS — M7918 Myalgia, other site: Secondary | ICD-10-CM

## 2024-03-09 MED ORDER — LORATADINE 10 MG PO TABS
10.0000 mg | ORAL_TABLET | Freq: Every day | ORAL | 11 refills | Status: AC
Start: 2024-03-09 — End: ?

## 2024-03-09 MED ORDER — LOSARTAN POTASSIUM 100 MG PO TABS: 100.0000 mg | ORAL_TABLET | Freq: Every day | ORAL | 0 refills | Status: DC

## 2024-03-09 MED ORDER — AZITHROMYCIN 250 MG PO TABS
ORAL_TABLET | ORAL | 0 refills | Status: AC
Start: 2024-03-09 — End: 2024-03-14

## 2024-03-09 MED ORDER — FLUTICASONE PROPIONATE 50 MCG/ACT NA SUSP: 2.0000 | Freq: Every day | NASAL | 6 refills | Status: AC

## 2024-03-09 MED ORDER — CYCLOBENZAPRINE HCL 10 MG PO TABS: 10.0000 mg | ORAL_TABLET | Freq: Three times a day (TID) | ORAL | 0 refills | Status: DC | PRN

## 2024-03-09 NOTE — Progress Notes (Signed)
 Acute Video Visit    Virtual Visit Consent:   Suriah Peragine, you are scheduled for a virtual visit with a [facility] provider today.     Just as with appointments in the office, your consent must be obtained to participate.  Your consent will be active for this visit and any virtual visit you may have with one of our providers in the next 365 days.     If you have a MyChart account, a copy of this consent can be sent to you electronically.  All virtual visits are billed to your insurance company just like a traditional visit in the office.    If the connection with a video visit is poor, the visit may have to be switched to a telephone visit.  With either a video or telephone visit, we are not always able to ensure that we have a secure connection.     I need to obtain your verbal consent now.   Are you willing to proceed with your visit today?    Kaitlin Holloway has provided verbal consent on 03/09/2024 for a virtual visit (video or telephone).   Lauraine Kitty, FNP  Date: 03/09/2024 8:00 AM  Subjective:     Patient ID: Kaitlin Holloway, female    DOB: [birth year], 53 y.o.   MRN: [id number]  LILLETTE Lauraine Kitty, connected with  Chavela Justiniano  ([id number], 1971-03-24) on 03/09/24 at  8:00 AM EST by a video-enabled telemedicine application and verified that I am speaking with the correct person using two identifiers.   Location: Patient: Home  Provider: Virtual Visit Location Provider: Home Office   I discussed the limitations of evaluation and management by telemedicine and the availability of in person appointments. The patient expressed understanding and agreed to proceed.      HPI  Kaitlin Holloway is a 53 y.o. who identifies as a female who was assigned female at birth, and is being seen today for a cough.   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 She felt that she was having a flair in her  allergies while staying at a shelter and this was irritating her/ causing her to cough more and more. She has now notes that her cough has progressed to a green mucous production.   Patient has environmental allergies, and can get irritated from several things including perfumes, and also cold air.  Denies a fever a fever today, felt like she did have one several days ago.   She has been using Robitussin over the counter without much relief   She has been receiving primary care at the [facility] has a follow up next week with Ronal Jenkins Houseman, is requesting a 30 day refill on her HTN meds and muscle relaxer that she uses as needed       Objective:      Physical Exam Constitutional:      General: She is not in acute distress.    Appearance: Normal appearance. She is not ill-appearing.  HENT:     Mouth/Throat:     Mouth: Mucous membranes are moist.  Pulmonary:     Effort: Pulmonary effort is normal.  Musculoskeletal:     Cervical back: Neck supple.  Neurological:     Mental Status: She is alert and oriented to person, place, and time.  Psychiatric:        Mood and Affect: Mood normal.  Assessment & Plan:   Follow up in 2 weeks with PCP earlier with any new or worsening symptoms    1. Bronchitis (Primary) - azithromycin (ZITHROMAX) 250 MG tablet; Take 2 tablets on day 1, then 1 tablet daily on days 2 through 5  Dispense: 6 tablet; Refill: 0  2. Environmental allergies - loratadine  (CLARITIN ) 10 MG tablet; Take 1 tablet (10 mg total) by mouth daily.  Dispense: 30 tablet; Refill: 11 - fluticasone  (FLONASE ) 50 MCG/ACT nasal spray; Place 2 sprays into both nostrils daily.  Dispense: 16 g; Refill: 6  3. Hypertension associated with diabetes (HCC) - losartan  (COZAAR ) 100 MG tablet; Take 1 tablet (100 mg total) by mouth daily.  Dispense: 30 tablet; Refill: 0  4. Muscle pain, lumbar - cyclobenzaprine  (FLEXERIL ) 10 MG tablet; Take 1 tablet (10 mg total) by mouth 3 (three) times  daily as needed for muscle spasms.  Dispense: 30 tablet; Refill: 0    Follow Up Instructions: I discussed the assessment and treatment plan with the patient. The patient was provided an opportunity to ask questions and all were answered. The patient agreed with the plan and demonstrated an understanding of the instructions.  A copy of instructions were sent to the patient via MyChart unless otherwise noted below.     The patient was advised to call back or seek an in-person evaluation if the symptoms worsen or if the condition fails to improve as anticipated.    Lauraine Kitty, FNP  **Disclaimer: This note may have been dictated with voice recognition software. Similar sounding words can inadvertently be transcribed and this note may contain transcription errors which may not have been corrected upon publication of note.**

## 2024-03-17 ENCOUNTER — Other Ambulatory Visit: Payer: Self-pay | Admitting: Nurse Practitioner

## 2024-03-17 DIAGNOSIS — G8929 Other chronic pain: Secondary | ICD-10-CM

## 2024-03-17 MED ORDER — IBUPROFEN 800 MG PO TABS: 800.0000 mg | ORAL_TABLET | Freq: Three times a day (TID) | ORAL | 0 refills | Status: DC | PRN

## 2024-04-07 ENCOUNTER — Other Ambulatory Visit: Payer: Self-pay | Admitting: *Deleted

## 2024-04-07 DIAGNOSIS — I152 Hypertension secondary to endocrine disorders: Secondary | ICD-10-CM

## 2024-04-07 MED ORDER — LOSARTAN POTASSIUM 100 MG PO TABS: 100.0000 mg | ORAL_TABLET | Freq: Every day | ORAL | 0 refills | Status: DC

## 2024-04-07 NOTE — Progress Notes (Unsigned)
 Reports she did not get Losartan  100.  Refill sent to the pharmacy of her choice ([facility])

## 2024-05-09 ENCOUNTER — Other Ambulatory Visit: Payer: Self-pay | Admitting: *Deleted

## 2024-05-09 DIAGNOSIS — G8929 Other chronic pain: Secondary | ICD-10-CM

## 2024-05-09 DIAGNOSIS — E1159 Type 2 diabetes mellitus with other circulatory complications: Secondary | ICD-10-CM

## 2024-05-09 DIAGNOSIS — M7918 Myalgia, other site: Secondary | ICD-10-CM

## 2024-05-09 MED ORDER — CYCLOBENZAPRINE HCL 10 MG PO TABS: 10.0000 mg | ORAL_TABLET | Freq: Three times a day (TID) | ORAL | 0 refills | Status: DC | PRN

## 2024-05-09 MED ORDER — IBUPROFEN 800 MG PO TABS: 800.0000 mg | ORAL_TABLET | Freq: Three times a day (TID) | ORAL | 0 refills | Status: DC | PRN

## 2024-05-09 MED ORDER — LOSARTAN POTASSIUM 100 MG PO TABS: 100.0000 mg | ORAL_TABLET | Freq: Every day | ORAL | 0 refills | Status: DC

## 2024-05-09 NOTE — Progress Notes (Signed)
 Came by for med refills and completing of PE form for work.  She had a TB skin that was (-). Ok for refills she requested. Ok for FUA 2/21

## 2024-05-13 ENCOUNTER — Other Ambulatory Visit: Payer: Self-pay | Admitting: *Deleted

## 2024-05-13 ENCOUNTER — Other Ambulatory Visit: Payer: Self-pay

## 2024-05-13 DIAGNOSIS — M7918 Myalgia, other site: Secondary | ICD-10-CM

## 2024-05-13 DIAGNOSIS — G8929 Other chronic pain: Secondary | ICD-10-CM

## 2024-05-13 DIAGNOSIS — I152 Hypertension secondary to endocrine disorders: Secondary | ICD-10-CM

## 2024-05-13 MED ORDER — IBUPROFEN 800 MG PO TABS
800.0000 mg | ORAL_TABLET | Freq: Three times a day (TID) | ORAL | 0 refills | Status: AC | PRN
  Filled 2024-05-13: qty 90, 30d supply, fill #0

## 2024-05-13 MED ORDER — LOSARTAN POTASSIUM 100 MG PO TABS
100.0000 mg | ORAL_TABLET | Freq: Every day | ORAL | 0 refills | Status: AC
  Filled 2024-05-13: qty 30, 30d supply, fill #0

## 2024-05-13 MED ORDER — CYCLOBENZAPRINE HCL 10 MG PO TABS
10.0000 mg | ORAL_TABLET | Freq: Three times a day (TID) | ORAL | 0 refills | Status: AC | PRN
  Filled 2024-05-13: qty 30, 10d supply, fill #0

## 2024-05-13 NOTE — Progress Notes (Unsigned)
 Notified by [facility] that They can not fill scripts as they need to be sent from the [facility] locaion (this location is not a department of [facility] as of yet) Meds transferres to [facility]

## 2024-05-14 NOTE — Progress Notes (Signed)
 The patient presented for a video visit on 03/09/24. Blood pressure screening wasn't conducted. During the appointment, the patient did not report any (SDOH).   A review of the patient's chart revealed that they do currently have a primary care provider (PCP) and no future appointments were indicated.The last office visit isn't visible via chl however the orders are. The pcp has an orders only encounter in chl on 05/09/24. According to progress note the pt was seen by pcp for med refill and a pe form for work. At this time, no additional support from the [facility] team is necessary.

## 2024-05-22 ENCOUNTER — Other Ambulatory Visit: Payer: Self-pay
